# Patient Record
Sex: Female | Born: 1965
Health system: Southern US, Community
[De-identification: ages and names within clinical notes are randomized; demographics above are authoritative.]

## PROBLEM LIST (undated history)

## (undated) DIAGNOSIS — Z8673 Personal history of transient ischemic attack (TIA), and cerebral infarction without residual deficits: Secondary | ICD-10-CM

## (undated) DIAGNOSIS — K219 Gastro-esophageal reflux disease without esophagitis: Secondary | ICD-10-CM

## (undated) DIAGNOSIS — F449 Dissociative and conversion disorder, unspecified: Secondary | ICD-10-CM

## (undated) DIAGNOSIS — K589 Irritable bowel syndrome without diarrhea: Secondary | ICD-10-CM

## (undated) DIAGNOSIS — H9190 Unspecified hearing loss, unspecified ear: Secondary | ICD-10-CM

## (undated) DIAGNOSIS — E785 Hyperlipidemia, unspecified: Secondary | ICD-10-CM

## (undated) DIAGNOSIS — A048 Other specified bacterial intestinal infections: Secondary | ICD-10-CM

## (undated) DIAGNOSIS — E162 Hypoglycemia, unspecified: Secondary | ICD-10-CM

## (undated) DIAGNOSIS — G4733 Obstructive sleep apnea (adult) (pediatric): Secondary | ICD-10-CM

## (undated) DIAGNOSIS — F32A Depression, unspecified: Secondary | ICD-10-CM

## (undated) DIAGNOSIS — M50322 Other cervical disc degeneration at C5-C6 level: Secondary | ICD-10-CM

## (undated) DIAGNOSIS — R42 Dizziness and giddiness: Secondary | ICD-10-CM

## (undated) DIAGNOSIS — M797 Fibromyalgia: Secondary | ICD-10-CM

## (undated) DIAGNOSIS — G894 Chronic pain syndrome: Secondary | ICD-10-CM

## (undated) DIAGNOSIS — H6990 Unspecified Eustachian tube disorder, unspecified ear: Secondary | ICD-10-CM

## (undated) DIAGNOSIS — G43909 Migraine, unspecified, not intractable, without status migrainosus: Secondary | ICD-10-CM

## (undated) DIAGNOSIS — J449 Chronic obstructive pulmonary disease, unspecified: Secondary | ICD-10-CM

## (undated) DIAGNOSIS — L409 Psoriasis, unspecified: Secondary | ICD-10-CM

## (undated) DIAGNOSIS — K3184 Gastroparesis: Secondary | ICD-10-CM

## (undated) DIAGNOSIS — I63522 Cerebral infarction due to unspecified occlusion or stenosis of left anterior cerebral artery: Secondary | ICD-10-CM

## (undated) DIAGNOSIS — G939 Disorder of brain, unspecified: Secondary | ICD-10-CM

## (undated) DIAGNOSIS — R5382 Chronic fatigue, unspecified: Secondary | ICD-10-CM

## (undated) DIAGNOSIS — M159 Polyosteoarthritis, unspecified: Secondary | ICD-10-CM

## (undated) DIAGNOSIS — K5792 Diverticulitis of intestine, part unspecified, without perforation or abscess without bleeding: Secondary | ICD-10-CM

## (undated) DIAGNOSIS — F419 Anxiety disorder, unspecified: Secondary | ICD-10-CM

## (undated) DIAGNOSIS — K449 Diaphragmatic hernia without obstruction or gangrene: Secondary | ICD-10-CM

## (undated) DIAGNOSIS — I10 Essential (primary) hypertension: Secondary | ICD-10-CM

## (undated) DIAGNOSIS — F329 Major depressive disorder, single episode, unspecified: Secondary | ICD-10-CM

## (undated) DIAGNOSIS — I639 Cerebral infarction, unspecified: Secondary | ICD-10-CM

## (undated) DIAGNOSIS — K649 Unspecified hemorrhoids: Secondary | ICD-10-CM

## (undated) DIAGNOSIS — Z8679 Personal history of other diseases of the circulatory system: Secondary | ICD-10-CM

## (undated) DIAGNOSIS — H6980 Other specified disorders of Eustachian tube, unspecified ear: Secondary | ICD-10-CM

## (undated) DIAGNOSIS — E559 Vitamin D deficiency, unspecified: Secondary | ICD-10-CM

## (undated) HISTORY — PX: OTHER SURGICAL HISTORY: SHX169

## (undated) HISTORY — DX: Irritable bowel syndrome, unspecified: K58.9

## (undated) HISTORY — DX: Hyperlipidemia, unspecified: E78.5

## (undated) HISTORY — DX: Cerebral infarction due to unspecified occlusion or stenosis of left anterior cerebral artery: I63.522

## (undated) HISTORY — DX: Unspecified eustachian tube disorder, unspecified ear: H69.90

## (undated) HISTORY — DX: Obstructive sleep apnea (adult) (pediatric): G47.33

## (undated) HISTORY — DX: Psoriasis, unspecified: L40.9

## (undated) HISTORY — PX: CHOLECYSTECTOMY: SHX55

## (undated) HISTORY — DX: Unspecified hearing loss, unspecified ear: H91.90

## (undated) HISTORY — PX: ABDOMINAL HYSTERECTOMY: SHX81

## (undated) HISTORY — DX: Migraine, unspecified, not intractable, without status migrainosus: G43.909

## (undated) HISTORY — DX: Gastroparesis: K31.84

## (undated) HISTORY — DX: Polyosteoarthritis, unspecified: M15.9

## (undated) HISTORY — DX: Unspecified hemorrhoids: K64.9

## (undated) HISTORY — DX: Dizziness and giddiness: R42

## (undated) HISTORY — DX: Personal history of other diseases of the circulatory system: Z86.79

## (undated) HISTORY — DX: Chronic pain syndrome: G89.4

## (undated) HISTORY — PX: TUBAL LIGATION: SHX77

## (undated) HISTORY — DX: Diverticulitis of intestine, part unspecified, without perforation or abscess without bleeding: K57.92

## (undated) HISTORY — DX: Morbid (severe) obesity due to excess calories: E66.01

## (undated) HISTORY — DX: Diaphragmatic hernia without obstruction or gangrene: K44.9

## (undated) HISTORY — DX: Other specified disorders of Eustachian tube, unspecified ear: H69.80

## (undated) HISTORY — DX: Dissociative and conversion disorder, unspecified: F44.9

## (undated) HISTORY — DX: Other specified bacterial intestinal infections: A04.8

## (undated) HISTORY — DX: Disorder of brain, unspecified: G93.9

## (undated) HISTORY — DX: Vitamin D deficiency, unspecified: E55.9

## (undated) HISTORY — DX: Gastro-esophageal reflux disease without esophagitis: K21.9

## (undated) HISTORY — DX: Hypoglycemia, unspecified: E16.2

## (undated) HISTORY — DX: Personal history of transient ischemic attack (TIA), and cerebral infarction without residual deficits: Z86.73

## (undated) HISTORY — DX: Other cervical disc degeneration at C5-C6 level: M50.322

---

## 2011-08-22 ENCOUNTER — Encounter (HOSPITAL_COMMUNITY): Payer: Self-pay | Admitting: *Deleted

## 2011-08-22 ENCOUNTER — Emergency Department (HOSPITAL_COMMUNITY): Payer: Medicare Other

## 2011-08-22 ENCOUNTER — Emergency Department (HOSPITAL_COMMUNITY)
Admission: EM | Admit: 2011-08-22 | Discharge: 2011-08-22 | Disposition: A | Discharge status: Home / Self Care | Payer: Medicare Other | Attending: Emergency Medicine | Admitting: Emergency Medicine

## 2011-08-22 DIAGNOSIS — R079 Chest pain, unspecified: Secondary | ICD-10-CM | POA: Insufficient documentation

## 2011-08-22 DIAGNOSIS — I10 Essential (primary) hypertension: Secondary | ICD-10-CM | POA: Insufficient documentation

## 2011-08-22 DIAGNOSIS — R5383 Other fatigue: Secondary | ICD-10-CM | POA: Insufficient documentation

## 2011-08-22 DIAGNOSIS — F341 Dysthymic disorder: Secondary | ICD-10-CM | POA: Insufficient documentation

## 2011-08-22 DIAGNOSIS — Z79899 Other long term (current) drug therapy: Secondary | ICD-10-CM | POA: Insufficient documentation

## 2011-08-22 DIAGNOSIS — N390 Urinary tract infection, site not specified: Secondary | ICD-10-CM

## 2011-08-22 DIAGNOSIS — Z8673 Personal history of transient ischemic attack (TIA), and cerebral infarction without residual deficits: Secondary | ICD-10-CM | POA: Insufficient documentation

## 2011-08-22 DIAGNOSIS — R5381 Other malaise: Secondary | ICD-10-CM | POA: Insufficient documentation

## 2011-08-22 DIAGNOSIS — J449 Chronic obstructive pulmonary disease, unspecified: Secondary | ICD-10-CM | POA: Insufficient documentation

## 2011-08-22 DIAGNOSIS — J4489 Other specified chronic obstructive pulmonary disease: Secondary | ICD-10-CM | POA: Insufficient documentation

## 2011-08-22 HISTORY — DX: Essential (primary) hypertension: I10

## 2011-08-22 HISTORY — DX: Fibromyalgia: M79.7

## 2011-08-22 HISTORY — DX: Chronic obstructive pulmonary disease, unspecified: J44.9

## 2011-08-22 HISTORY — DX: Anxiety disorder, unspecified: F41.9

## 2011-08-22 HISTORY — DX: Cerebral infarction, unspecified: I63.9

## 2011-08-22 HISTORY — DX: Major depressive disorder, single episode, unspecified: F32.9

## 2011-08-22 HISTORY — DX: Depression, unspecified: F32.A

## 2011-08-22 HISTORY — DX: Chronic fatigue, unspecified: R53.82

## 2011-08-22 LAB — URINALYSIS, ROUTINE W REFLEX MICROSCOPIC
Glucose, UA: NEGATIVE mg/dL
Hgb urine dipstick: NEGATIVE
Protein, ur: NEGATIVE mg/dL
Specific Gravity, Urine: 1.011 (ref 1.005–1.030)
pH: 6.5 (ref 5.0–8.0)

## 2011-08-22 LAB — DIFFERENTIAL
Basophils Absolute: 0 10*3/uL (ref 0.0–0.1)
Basophils Relative: 0 % (ref 0–1)
Eosinophils Absolute: 0.1 10*3/uL (ref 0.0–0.7)
Eosinophils Relative: 1 % (ref 0–5)
Monocytes Absolute: 0.6 10*3/uL (ref 0.1–1.0)
Monocytes Relative: 7 % (ref 3–12)
Neutro Abs: 4.9 10*3/uL (ref 1.7–7.7)

## 2011-08-22 LAB — COMPREHENSIVE METABOLIC PANEL
AST: 29 U/L (ref 0–37)
Albumin: 3.7 g/dL (ref 3.5–5.2)
BUN: 9 mg/dL (ref 6–23)
Calcium: 9.8 mg/dL (ref 8.4–10.5)
Chloride: 101 mEq/L (ref 96–112)
Creatinine, Ser: 0.67 mg/dL (ref 0.50–1.10)
Total Bilirubin: 0.4 mg/dL (ref 0.3–1.2)
Total Protein: 7.1 g/dL (ref 6.0–8.3)

## 2011-08-22 LAB — CBC
HCT: 43.9 % (ref 36.0–46.0)
Hemoglobin: 15.4 g/dL — ABNORMAL HIGH (ref 12.0–15.0)
MCH: 30.9 pg (ref 26.0–34.0)
MCHC: 35.1 g/dL (ref 30.0–36.0)
RDW: 12.7 % (ref 11.5–15.5)

## 2011-08-22 LAB — POCT PREGNANCY, URINE: Preg Test, Ur: NEGATIVE

## 2011-08-22 LAB — URINE MICROSCOPIC-ADD ON

## 2011-08-22 MED ORDER — SULFAMETHOXAZOLE-TMP DS 800-160 MG PO TABS
1.0000 | ORAL_TABLET | Freq: Once | ORAL | Status: AC
  Administered 2011-08-22: 1 via ORAL
  Filled 2011-08-22: qty 1

## 2011-08-22 MED ORDER — SODIUM CHLORIDE 0.9 % IV BOLUS (SEPSIS)
1000.0000 mL | Freq: Once | INTRAVENOUS | Status: AC
Start: 2011-08-22 — End: 2011-08-22
  Administered 2011-08-22: 1000 mL via INTRAVENOUS

## 2011-08-22 MED ORDER — SULFAMETHOXAZOLE-TRIMETHOPRIM 800-160 MG PO TABS: 1.0000 | ORAL_TABLET | Freq: Two times a day (BID) | ORAL | Status: AC

## 2011-08-22 NOTE — ED Notes (Addendum)
Report given and care transferred to Lyn, RN. 

## 2011-08-22 NOTE — ED Notes (Signed)
Pt ambulated to RR for ccus.

## 2011-08-22 NOTE — Discharge Instructions (Signed)

## 2011-08-22 NOTE — ED Notes (Signed)
Pt resting in stretcher in NAD, respirations even and unlabored. 

## 2011-08-22 NOTE — ED Notes (Signed)
MD at bedside. 

## 2011-08-22 NOTE — ED Notes (Signed)
Patient reports her arms and feeling numb,  It started on the right and now moved to the left.  She is having intermittent chest pain as well.  She states her mouth feels dry as well and she feels confused.  Patient states she started to feel bad on Monday night.

## 2011-08-22 NOTE — ED Notes (Signed)
Pt getting undressed and into a gown; pt placed on monitor, continuous pulse oximetry and blood pressure cuff; warm blankets given

## 2011-08-22 NOTE — ED Notes (Signed)
Patient transported to X-ray 

## 2011-08-22 NOTE — ED Provider Notes (Signed)
History     CSN: [id number]  Arrival date & time 08/22/11  1323   First MD Initiated Contact with Patient 08/22/11 1502      Chief Complaint  Patient presents with  . Weakness  . Chest Pain     HPI The patient presents with concerns of fatigue and right arm dysesthesia.  She notes that she has a history both of her strokes and of fibromyalgia.  Over the past 2 days she has gradually developed more present and uncomfortable diffuse dysesthesia throughout her right arm.  This is worse with motion.  She notes concurrent fatigue and generalized sense of discomfort.  She denies any fevers, chills, nausea, vomiting.  She has been taking her medications appropriately, and there have been no recent changes.  She notes that this episode is similar to multiple prior events.  Past Medical History  Diagnosis Date  . Stroke   . Fibromyalgia   . Hypertension   . Asthma   . COPD (chronic obstructive pulmonary disease)   . Chronic fatigue   . Anxiety   . Depression     Past Surgical History  Procedure Date  . Cholecystectomy   . Tubal ligation   . Abdominal hysterectomy     No family history on file.  History  Substance Use Topics  . Smoking status: Never Smoker   . Smokeless tobacco: Not on file  . Alcohol Use: No    OB History    Grav Para Term Preterm Abortions TAB SAB Ect Mult Living                  Review of Systems  Constitutional:       HPI  HENT:       HPI otherwise negative  Eyes: Negative.   Respiratory:       HPI, otherwise negative  Cardiovascular:       HPI, otherwise nmegative  Gastrointestinal: Negative for vomiting.  Genitourinary:       HPI, otherwise negative  Musculoskeletal:       HPI, otherwise negative  Skin: Negative.   Neurological: Negative for syncope.    Allergies  Review of patient's allergies indicates no known allergies.  Home Medications   Current Outpatient Rx  Name Route Sig Dispense Refill  . ALBUTEROL SULFATE HFA 108  (90 BASE) MCG/ACT IN AERS Inhalation Inhale 2 puffs into the lungs every 4 (four) hours as needed. For wheezing    . ALPRAZOLAM 0.25 MG PO TABS Oral Take 0.25 mg by mouth 3 (three) times daily as needed. For anxiety    . CITALOPRAM HYDROBROMIDE 20 MG PO TABS Oral Take 20 mg by mouth daily.    Marland Kitchen CLOPIDOGREL BISULFATE 75 MG PO TABS Oral Take 75 mg by mouth daily.    . CYCLOBENZAPRINE HCL 10 MG PO TABS Oral Take 10 mg by mouth 3 (three) times daily as needed. For spasm    . DICYCLOMINE HCL 20 MG PO TABS Oral Take 20 mg by mouth every 6 (six) hours.    Marland Kitchen HYDROCODONE-ACETAMINOPHEN 10-500 MG PO TABS Oral Take 1 tablet by mouth 4 (four) times daily.    Marland Kitchen LISINOPRIL 40 MG PO TABS Oral Take 40 mg by mouth daily.    Marland Kitchen MUPIROCIN 2 % EX OINT Topical Apply 1 application topically 3 (three) times daily.    Marland Kitchen OMEPRAZOLE 20 MG PO CPDR Oral Take 20 mg by mouth 2 (two) times daily.    . ORPHENADRINE CITRATE ER 100  MG PO TB12 Oral Take 100 mg by mouth 2 (two) times daily as needed. For spasm    . PROMETHAZINE HCL 25 MG PO TABS Oral Take 25 mg by mouth every 6 (six) hours as needed. For nausea    . RANITIDINE HCL 150 MG PO TABS Oral Take 300 mg by mouth daily.    . TOPIRAMATE 50 MG PO TABS Oral Take 50 mg by mouth 2 (two) times daily.      BP 153/99  Pulse 76  Temp(Src) 98.4 F (36.9 C) (Oral)  Resp 16  Ht 4\' 10"  (1.473 m)  Wt 171 lb (77.565 kg)  BMI 35.74 kg/m2  SpO2 98%  Physical Exam  Nursing note and vitals reviewed. Constitutional: She is oriented to person, place, and time. She appears well-developed and well-nourished. No distress.  HENT:  Head: Normocephalic and atraumatic.  Mouth/Throat: Mucous membranes are dry and not cyanotic.  Eyes: Conjunctivae and EOM are normal.  Cardiovascular: Normal rate and regular rhythm.   Pulmonary/Chest: Effort normal and breath sounds normal. No stridor. No respiratory distress.  Abdominal: She exhibits no distension.  Musculoskeletal: She exhibits no edema.         5/5 strength in bil ue/le in shoulders, wrist, ankles, hips.  Neurological: She is alert and oriented to person, place, and time. She has normal strength. She displays no atrophy and no tremor. No cranial nerve deficit or sensory deficit. She displays no seizure activity. Coordination normal.       Patient describes R arm "different" sensation, but affirms sensing light touch on my exam.  Skin: Skin is warm and dry. She is not diaphoretic.  Psychiatric: She has a normal mood and affect.    ED Course  Procedures (including critical care time)   Labs Reviewed  CBC  DIFFERENTIAL  COMPREHENSIVE METABOLIC PANEL  URINALYSIS, ROUTINE W REFLEX MICROSCOPIC   No results found.   No diagnosis found.  Cardiac: 77 sr, normal  Pulse Ox 96% RA - normal   Date: 08/22/2011  Rate: 82  Rhythm: normal sinus rhythm  QRS Axis: normal  Intervals: normal  ST/T Wave abnormalities: normal  Conduction Disutrbances: none  Narrative Interpretation: unremarkable       MDM  This 46 year old female presents with several days of generalized complaints, extremity dysesthesia.  On exam the patient is in no distress with unremarkable vital signs.  Patient has no focal neurologic deficits.  She does appear dehydrated, with a dry mouth.  Following rehydration with IV fluids, the patient noted an improvement in her condition.  The patient's labs and was notable for a urinary tract infection.  The patient was discharged in stable condition to follow up with her primary care physician.  Given the absence of focal neurologic findings, radiographic imaging was not indicated.        Gerhard Munch, MD 08/23/11 [phone]

## 2013-09-11 HISTORY — PX: ESOPHAGOGASTRODUODENOSCOPY: SHX1529

## 2014-05-17 DIAGNOSIS — I10 Essential (primary) hypertension: Secondary | ICD-10-CM | POA: Diagnosis not present

## 2014-05-17 DIAGNOSIS — R0789 Other chest pain: Secondary | ICD-10-CM | POA: Diagnosis not present

## 2014-05-20 DIAGNOSIS — R079 Chest pain, unspecified: Secondary | ICD-10-CM | POA: Diagnosis not present

## 2014-05-20 DIAGNOSIS — R0602 Shortness of breath: Secondary | ICD-10-CM | POA: Diagnosis not present

## 2014-05-20 DIAGNOSIS — I10 Essential (primary) hypertension: Secondary | ICD-10-CM | POA: Diagnosis not present

## 2014-05-20 DIAGNOSIS — R0789 Other chest pain: Secondary | ICD-10-CM | POA: Diagnosis not present

## 2014-05-24 DIAGNOSIS — E785 Hyperlipidemia, unspecified: Secondary | ICD-10-CM | POA: Diagnosis not present

## 2014-05-24 DIAGNOSIS — R0789 Other chest pain: Secondary | ICD-10-CM | POA: Diagnosis not present

## 2014-05-24 DIAGNOSIS — M797 Fibromyalgia: Secondary | ICD-10-CM | POA: Diagnosis not present

## 2014-05-24 DIAGNOSIS — R0602 Shortness of breath: Secondary | ICD-10-CM | POA: Diagnosis not present

## 2014-05-24 DIAGNOSIS — I10 Essential (primary) hypertension: Secondary | ICD-10-CM | POA: Diagnosis not present

## 2014-05-31 DIAGNOSIS — G4733 Obstructive sleep apnea (adult) (pediatric): Secondary | ICD-10-CM | POA: Diagnosis not present

## 2014-05-31 DIAGNOSIS — J4520 Mild intermittent asthma, uncomplicated: Secondary | ICD-10-CM | POA: Diagnosis not present

## 2014-05-31 DIAGNOSIS — R5383 Other fatigue: Secondary | ICD-10-CM | POA: Diagnosis not present

## 2014-06-28 DIAGNOSIS — R5383 Other fatigue: Secondary | ICD-10-CM | POA: Diagnosis not present

## 2014-06-28 DIAGNOSIS — J4530 Mild persistent asthma, uncomplicated: Secondary | ICD-10-CM | POA: Diagnosis not present

## 2014-06-28 DIAGNOSIS — G4733 Obstructive sleep apnea (adult) (pediatric): Secondary | ICD-10-CM | POA: Diagnosis not present

## 2014-06-30 DIAGNOSIS — M5412 Radiculopathy, cervical region: Secondary | ICD-10-CM | POA: Diagnosis not present

## 2014-07-01 DIAGNOSIS — M9971 Connective tissue and disc stenosis of intervertebral foramina of cervical region: Secondary | ICD-10-CM | POA: Diagnosis not present

## 2014-07-01 DIAGNOSIS — M5412 Radiculopathy, cervical region: Secondary | ICD-10-CM | POA: Diagnosis not present

## 2014-07-01 DIAGNOSIS — M5082 Other cervical disc disorders, mid-cervical region: Secondary | ICD-10-CM | POA: Diagnosis not present

## 2014-07-02 DIAGNOSIS — M5412 Radiculopathy, cervical region: Secondary | ICD-10-CM | POA: Diagnosis not present

## 2014-07-22 DIAGNOSIS — I10 Essential (primary) hypertension: Secondary | ICD-10-CM | POA: Diagnosis not present

## 2014-07-22 DIAGNOSIS — Z79899 Other long term (current) drug therapy: Secondary | ICD-10-CM | POA: Diagnosis not present

## 2014-07-22 DIAGNOSIS — I6789 Other cerebrovascular disease: Secondary | ICD-10-CM | POA: Diagnosis not present

## 2014-07-22 DIAGNOSIS — E785 Hyperlipidemia, unspecified: Secondary | ICD-10-CM | POA: Diagnosis not present

## 2014-07-22 DIAGNOSIS — E559 Vitamin D deficiency, unspecified: Secondary | ICD-10-CM | POA: Diagnosis not present

## 2014-07-26 DIAGNOSIS — R5383 Other fatigue: Secondary | ICD-10-CM | POA: Diagnosis not present

## 2014-07-26 DIAGNOSIS — G4733 Obstructive sleep apnea (adult) (pediatric): Secondary | ICD-10-CM | POA: Diagnosis not present

## 2014-07-26 DIAGNOSIS — J4550 Severe persistent asthma, uncomplicated: Secondary | ICD-10-CM | POA: Diagnosis not present

## 2014-08-04 DIAGNOSIS — Z6841 Body Mass Index (BMI) 40.0 and over, adult: Secondary | ICD-10-CM | POA: Diagnosis not present

## 2014-08-04 DIAGNOSIS — J208 Acute bronchitis due to other specified organisms: Secondary | ICD-10-CM | POA: Diagnosis not present

## 2014-12-01 DIAGNOSIS — G4733 Obstructive sleep apnea (adult) (pediatric): Secondary | ICD-10-CM | POA: Diagnosis not present

## 2014-12-31 DIAGNOSIS — Z6841 Body Mass Index (BMI) 40.0 and over, adult: Secondary | ICD-10-CM | POA: Diagnosis not present

## 2014-12-31 DIAGNOSIS — K589 Irritable bowel syndrome without diarrhea: Secondary | ICD-10-CM | POA: Diagnosis not present

## 2014-12-31 DIAGNOSIS — R21 Rash and other nonspecific skin eruption: Secondary | ICD-10-CM | POA: Diagnosis not present

## 2014-12-31 DIAGNOSIS — F5104 Psychophysiologic insomnia: Secondary | ICD-10-CM | POA: Diagnosis not present

## 2015-01-25 DIAGNOSIS — M797 Fibromyalgia: Secondary | ICD-10-CM | POA: Diagnosis not present

## 2015-01-25 DIAGNOSIS — I6789 Other cerebrovascular disease: Secondary | ICD-10-CM | POA: Diagnosis not present

## 2015-01-25 DIAGNOSIS — E785 Hyperlipidemia, unspecified: Secondary | ICD-10-CM | POA: Diagnosis not present

## 2015-01-25 DIAGNOSIS — E559 Vitamin D deficiency, unspecified: Secondary | ICD-10-CM | POA: Diagnosis not present

## 2015-01-25 DIAGNOSIS — I10 Essential (primary) hypertension: Secondary | ICD-10-CM | POA: Diagnosis not present

## 2015-01-25 DIAGNOSIS — Z79899 Other long term (current) drug therapy: Secondary | ICD-10-CM | POA: Diagnosis not present

## 2015-02-07 DIAGNOSIS — R5383 Other fatigue: Secondary | ICD-10-CM | POA: Diagnosis not present

## 2015-02-07 DIAGNOSIS — G4733 Obstructive sleep apnea (adult) (pediatric): Secondary | ICD-10-CM | POA: Diagnosis not present

## 2015-02-07 DIAGNOSIS — J4520 Mild intermittent asthma, uncomplicated: Secondary | ICD-10-CM | POA: Diagnosis not present

## 2015-03-02 DIAGNOSIS — Z6841 Body Mass Index (BMI) 40.0 and over, adult: Secondary | ICD-10-CM | POA: Diagnosis not present

## 2015-03-10 DIAGNOSIS — Z86010 Personal history of colonic polyps: Secondary | ICD-10-CM | POA: Diagnosis not present

## 2015-03-10 DIAGNOSIS — R1013 Epigastric pain: Secondary | ICD-10-CM | POA: Diagnosis not present

## 2015-03-10 DIAGNOSIS — Z1211 Encounter for screening for malignant neoplasm of colon: Secondary | ICD-10-CM | POA: Diagnosis not present

## 2015-03-11 DIAGNOSIS — A09 Infectious gastroenteritis and colitis, unspecified: Secondary | ICD-10-CM | POA: Diagnosis not present

## 2015-03-21 DIAGNOSIS — K589 Irritable bowel syndrome without diarrhea: Secondary | ICD-10-CM | POA: Diagnosis not present

## 2015-03-21 DIAGNOSIS — R1013 Epigastric pain: Secondary | ICD-10-CM | POA: Diagnosis not present

## 2015-04-28 DIAGNOSIS — Z09 Encounter for follow-up examination after completed treatment for conditions other than malignant neoplasm: Secondary | ICD-10-CM | POA: Diagnosis not present

## 2015-04-28 DIAGNOSIS — Z1211 Encounter for screening for malignant neoplasm of colon: Secondary | ICD-10-CM | POA: Diagnosis not present

## 2015-04-28 DIAGNOSIS — I10 Essential (primary) hypertension: Secondary | ICD-10-CM | POA: Diagnosis not present

## 2015-04-28 DIAGNOSIS — Z9049 Acquired absence of other specified parts of digestive tract: Secondary | ICD-10-CM | POA: Diagnosis not present

## 2015-04-28 DIAGNOSIS — K5730 Diverticulosis of large intestine without perforation or abscess without bleeding: Secondary | ICD-10-CM | POA: Diagnosis not present

## 2015-04-28 DIAGNOSIS — K219 Gastro-esophageal reflux disease without esophagitis: Secondary | ICD-10-CM | POA: Diagnosis not present

## 2015-04-28 DIAGNOSIS — R197 Diarrhea, unspecified: Secondary | ICD-10-CM | POA: Diagnosis not present

## 2015-04-28 DIAGNOSIS — F419 Anxiety disorder, unspecified: Secondary | ICD-10-CM | POA: Diagnosis not present

## 2015-04-28 DIAGNOSIS — K648 Other hemorrhoids: Secondary | ICD-10-CM | POA: Diagnosis not present

## 2015-04-28 DIAGNOSIS — K589 Irritable bowel syndrome without diarrhea: Secondary | ICD-10-CM | POA: Diagnosis not present

## 2015-04-28 DIAGNOSIS — Z86010 Personal history of colonic polyps: Secondary | ICD-10-CM | POA: Diagnosis not present

## 2015-04-28 DIAGNOSIS — J45909 Unspecified asthma, uncomplicated: Secondary | ICD-10-CM | POA: Diagnosis not present

## 2015-04-28 HISTORY — PX: COLONOSCOPY: SHX5424

## 2015-06-22 DIAGNOSIS — Z6841 Body Mass Index (BMI) 40.0 and over, adult: Secondary | ICD-10-CM | POA: Diagnosis not present

## 2015-06-22 DIAGNOSIS — J4521 Mild intermittent asthma with (acute) exacerbation: Secondary | ICD-10-CM | POA: Diagnosis not present

## 2015-06-22 DIAGNOSIS — R5383 Other fatigue: Secondary | ICD-10-CM | POA: Diagnosis not present

## 2015-06-22 DIAGNOSIS — G4733 Obstructive sleep apnea (adult) (pediatric): Secondary | ICD-10-CM | POA: Diagnosis not present

## 2015-06-22 DIAGNOSIS — R21 Rash and other nonspecific skin eruption: Secondary | ICD-10-CM | POA: Diagnosis not present

## 2015-07-08 DIAGNOSIS — Z0100 Encounter for examination of eyes and vision without abnormal findings: Secondary | ICD-10-CM | POA: Diagnosis not present

## 2015-07-08 DIAGNOSIS — H5213 Myopia, bilateral: Secondary | ICD-10-CM | POA: Diagnosis not present

## 2015-07-28 DIAGNOSIS — E559 Vitamin D deficiency, unspecified: Secondary | ICD-10-CM | POA: Diagnosis not present

## 2015-07-28 DIAGNOSIS — E785 Hyperlipidemia, unspecified: Secondary | ICD-10-CM | POA: Diagnosis not present

## 2015-07-28 DIAGNOSIS — Z79899 Other long term (current) drug therapy: Secondary | ICD-10-CM | POA: Diagnosis not present

## 2015-07-28 DIAGNOSIS — I6789 Other cerebrovascular disease: Secondary | ICD-10-CM | POA: Diagnosis not present

## 2015-07-28 DIAGNOSIS — I10 Essential (primary) hypertension: Secondary | ICD-10-CM | POA: Diagnosis not present

## 2015-07-28 DIAGNOSIS — Z1389 Encounter for screening for other disorder: Secondary | ICD-10-CM | POA: Diagnosis not present

## 2015-07-28 DIAGNOSIS — Z1231 Encounter for screening mammogram for malignant neoplasm of breast: Secondary | ICD-10-CM | POA: Diagnosis not present

## 2015-07-28 DIAGNOSIS — M797 Fibromyalgia: Secondary | ICD-10-CM | POA: Diagnosis not present

## 2015-10-24 DIAGNOSIS — H5213 Myopia, bilateral: Secondary | ICD-10-CM | POA: Diagnosis not present

## 2015-10-24 DIAGNOSIS — H5210 Myopia, unspecified eye: Secondary | ICD-10-CM | POA: Diagnosis not present

## 2015-11-11 DIAGNOSIS — E785 Hyperlipidemia, unspecified: Secondary | ICD-10-CM | POA: Diagnosis not present

## 2015-11-11 DIAGNOSIS — I6789 Other cerebrovascular disease: Secondary | ICD-10-CM | POA: Diagnosis not present

## 2015-11-11 DIAGNOSIS — J45909 Unspecified asthma, uncomplicated: Secondary | ICD-10-CM | POA: Diagnosis not present

## 2015-11-11 DIAGNOSIS — I10 Essential (primary) hypertension: Secondary | ICD-10-CM | POA: Diagnosis not present

## 2015-11-11 DIAGNOSIS — M797 Fibromyalgia: Secondary | ICD-10-CM | POA: Diagnosis not present

## 2015-11-11 DIAGNOSIS — F419 Anxiety disorder, unspecified: Secondary | ICD-10-CM | POA: Diagnosis not present

## 2015-11-11 DIAGNOSIS — Z79899 Other long term (current) drug therapy: Secondary | ICD-10-CM | POA: Diagnosis not present

## 2015-11-11 DIAGNOSIS — E559 Vitamin D deficiency, unspecified: Secondary | ICD-10-CM | POA: Diagnosis not present

## 2015-11-11 DIAGNOSIS — G894 Chronic pain syndrome: Secondary | ICD-10-CM | POA: Diagnosis not present

## 2015-12-08 DIAGNOSIS — Z6839 Body mass index (BMI) 39.0-39.9, adult: Secondary | ICD-10-CM | POA: Diagnosis not present

## 2015-12-08 DIAGNOSIS — I10 Essential (primary) hypertension: Secondary | ICD-10-CM | POA: Diagnosis not present

## 2016-01-05 DIAGNOSIS — Z6838 Body mass index (BMI) 38.0-38.9, adult: Secondary | ICD-10-CM | POA: Diagnosis not present

## 2016-01-05 DIAGNOSIS — I10 Essential (primary) hypertension: Secondary | ICD-10-CM | POA: Diagnosis not present

## 2016-02-02 DIAGNOSIS — I10 Essential (primary) hypertension: Secondary | ICD-10-CM | POA: Diagnosis not present

## 2016-02-02 DIAGNOSIS — Z6838 Body mass index (BMI) 38.0-38.9, adult: Secondary | ICD-10-CM | POA: Diagnosis not present

## 2016-03-05 DIAGNOSIS — G894 Chronic pain syndrome: Secondary | ICD-10-CM | POA: Diagnosis not present

## 2016-03-05 DIAGNOSIS — R635 Abnormal weight gain: Secondary | ICD-10-CM | POA: Diagnosis not present

## 2016-03-05 DIAGNOSIS — L219 Seborrheic dermatitis, unspecified: Secondary | ICD-10-CM | POA: Diagnosis not present

## 2016-03-05 DIAGNOSIS — Z1231 Encounter for screening mammogram for malignant neoplasm of breast: Secondary | ICD-10-CM | POA: Diagnosis not present

## 2016-03-05 DIAGNOSIS — Z6838 Body mass index (BMI) 38.0-38.9, adult: Secondary | ICD-10-CM | POA: Diagnosis not present

## 2016-03-05 DIAGNOSIS — I10 Essential (primary) hypertension: Secondary | ICD-10-CM | POA: Diagnosis not present

## 2016-03-05 DIAGNOSIS — M50322 Other cervical disc degeneration at C5-C6 level: Secondary | ICD-10-CM | POA: Diagnosis not present

## 2016-04-02 DIAGNOSIS — I10 Essential (primary) hypertension: Secondary | ICD-10-CM | POA: Diagnosis not present

## 2016-04-02 DIAGNOSIS — Z6839 Body mass index (BMI) 39.0-39.9, adult: Secondary | ICD-10-CM | POA: Diagnosis not present

## 2016-04-02 DIAGNOSIS — G894 Chronic pain syndrome: Secondary | ICD-10-CM | POA: Diagnosis not present

## 2016-04-02 DIAGNOSIS — M50322 Other cervical disc degeneration at C5-C6 level: Secondary | ICD-10-CM | POA: Diagnosis not present

## 2016-04-02 DIAGNOSIS — R635 Abnormal weight gain: Secondary | ICD-10-CM | POA: Diagnosis not present

## 2016-04-03 DIAGNOSIS — Z1231 Encounter for screening mammogram for malignant neoplasm of breast: Secondary | ICD-10-CM | POA: Diagnosis not present

## 2016-05-01 DIAGNOSIS — G894 Chronic pain syndrome: Secondary | ICD-10-CM | POA: Diagnosis not present

## 2016-05-01 DIAGNOSIS — Z6838 Body mass index (BMI) 38.0-38.9, adult: Secondary | ICD-10-CM | POA: Diagnosis not present

## 2016-05-01 DIAGNOSIS — R635 Abnormal weight gain: Secondary | ICD-10-CM | POA: Diagnosis not present

## 2016-05-01 DIAGNOSIS — E669 Obesity, unspecified: Secondary | ICD-10-CM | POA: Diagnosis not present

## 2016-05-01 DIAGNOSIS — I10 Essential (primary) hypertension: Secondary | ICD-10-CM | POA: Diagnosis not present

## 2016-05-04 DIAGNOSIS — Z0100 Encounter for examination of eyes and vision without abnormal findings: Secondary | ICD-10-CM | POA: Diagnosis not present

## 2016-05-30 DIAGNOSIS — R635 Abnormal weight gain: Secondary | ICD-10-CM | POA: Diagnosis not present

## 2016-05-30 DIAGNOSIS — Z6838 Body mass index (BMI) 38.0-38.9, adult: Secondary | ICD-10-CM | POA: Diagnosis not present

## 2016-05-30 DIAGNOSIS — I10 Essential (primary) hypertension: Secondary | ICD-10-CM | POA: Diagnosis not present

## 2016-05-30 DIAGNOSIS — E669 Obesity, unspecified: Secondary | ICD-10-CM | POA: Diagnosis not present

## 2016-05-30 DIAGNOSIS — G894 Chronic pain syndrome: Secondary | ICD-10-CM | POA: Diagnosis not present

## 2016-06-22 DIAGNOSIS — Z6838 Body mass index (BMI) 38.0-38.9, adult: Secondary | ICD-10-CM | POA: Diagnosis not present

## 2016-06-22 DIAGNOSIS — J208 Acute bronchitis due to other specified organisms: Secondary | ICD-10-CM | POA: Diagnosis not present

## 2016-06-29 DIAGNOSIS — Z79899 Other long term (current) drug therapy: Secondary | ICD-10-CM | POA: Diagnosis not present

## 2016-06-29 DIAGNOSIS — M4726 Other spondylosis with radiculopathy, lumbar region: Secondary | ICD-10-CM | POA: Diagnosis not present

## 2016-06-29 DIAGNOSIS — M797 Fibromyalgia: Secondary | ICD-10-CM | POA: Diagnosis not present

## 2016-06-29 DIAGNOSIS — F419 Anxiety disorder, unspecified: Secondary | ICD-10-CM | POA: Diagnosis not present

## 2016-06-29 DIAGNOSIS — R635 Abnormal weight gain: Secondary | ICD-10-CM | POA: Diagnosis not present

## 2016-06-29 DIAGNOSIS — E785 Hyperlipidemia, unspecified: Secondary | ICD-10-CM | POA: Diagnosis not present

## 2016-06-29 DIAGNOSIS — I10 Essential (primary) hypertension: Secondary | ICD-10-CM | POA: Diagnosis not present

## 2016-06-29 DIAGNOSIS — M159 Polyosteoarthritis, unspecified: Secondary | ICD-10-CM | POA: Diagnosis not present

## 2016-06-29 DIAGNOSIS — M50322 Other cervical disc degeneration at C5-C6 level: Secondary | ICD-10-CM | POA: Diagnosis not present

## 2016-06-29 DIAGNOSIS — E559 Vitamin D deficiency, unspecified: Secondary | ICD-10-CM | POA: Diagnosis not present

## 2016-06-29 DIAGNOSIS — R7301 Impaired fasting glucose: Secondary | ICD-10-CM | POA: Diagnosis not present

## 2016-06-29 DIAGNOSIS — G894 Chronic pain syndrome: Secondary | ICD-10-CM | POA: Diagnosis not present

## 2016-07-10 DIAGNOSIS — J4530 Mild persistent asthma, uncomplicated: Secondary | ICD-10-CM | POA: Diagnosis not present

## 2016-07-10 DIAGNOSIS — G4733 Obstructive sleep apnea (adult) (pediatric): Secondary | ICD-10-CM | POA: Diagnosis not present

## 2016-07-10 DIAGNOSIS — R5383 Other fatigue: Secondary | ICD-10-CM | POA: Diagnosis not present

## 2016-07-25 DIAGNOSIS — M4726 Other spondylosis with radiculopathy, lumbar region: Secondary | ICD-10-CM | POA: Diagnosis not present

## 2016-07-25 DIAGNOSIS — M47812 Spondylosis without myelopathy or radiculopathy, cervical region: Secondary | ICD-10-CM | POA: Diagnosis not present

## 2016-07-25 DIAGNOSIS — M50322 Other cervical disc degeneration at C5-C6 level: Secondary | ICD-10-CM | POA: Diagnosis not present

## 2016-07-25 DIAGNOSIS — M545 Low back pain: Secondary | ICD-10-CM | POA: Diagnosis not present

## 2016-07-30 DIAGNOSIS — Z1389 Encounter for screening for other disorder: Secondary | ICD-10-CM | POA: Diagnosis not present

## 2016-07-30 DIAGNOSIS — I10 Essential (primary) hypertension: Secondary | ICD-10-CM | POA: Diagnosis not present

## 2016-07-30 DIAGNOSIS — Z6839 Body mass index (BMI) 39.0-39.9, adult: Secondary | ICD-10-CM | POA: Diagnosis not present

## 2016-07-30 DIAGNOSIS — G894 Chronic pain syndrome: Secondary | ICD-10-CM | POA: Diagnosis not present

## 2016-07-30 DIAGNOSIS — R635 Abnormal weight gain: Secondary | ICD-10-CM | POA: Diagnosis not present

## 2016-07-30 DIAGNOSIS — M797 Fibromyalgia: Secondary | ICD-10-CM | POA: Diagnosis not present

## 2016-08-08 DIAGNOSIS — G4733 Obstructive sleep apnea (adult) (pediatric): Secondary | ICD-10-CM | POA: Diagnosis not present

## 2016-08-15 DIAGNOSIS — J4520 Mild intermittent asthma, uncomplicated: Secondary | ICD-10-CM | POA: Diagnosis not present

## 2016-08-15 DIAGNOSIS — G4733 Obstructive sleep apnea (adult) (pediatric): Secondary | ICD-10-CM | POA: Diagnosis not present

## 2016-08-15 DIAGNOSIS — R5383 Other fatigue: Secondary | ICD-10-CM | POA: Diagnosis not present

## 2016-08-21 DIAGNOSIS — R269 Unspecified abnormalities of gait and mobility: Secondary | ICD-10-CM | POA: Diagnosis not present

## 2016-08-21 DIAGNOSIS — G4733 Obstructive sleep apnea (adult) (pediatric): Secondary | ICD-10-CM | POA: Diagnosis not present

## 2016-08-24 DIAGNOSIS — M5030 Other cervical disc degeneration, unspecified cervical region: Secondary | ICD-10-CM | POA: Diagnosis not present

## 2016-08-29 DIAGNOSIS — R635 Abnormal weight gain: Secondary | ICD-10-CM | POA: Diagnosis not present

## 2016-08-29 DIAGNOSIS — G894 Chronic pain syndrome: Secondary | ICD-10-CM | POA: Diagnosis not present

## 2016-08-29 DIAGNOSIS — Z6838 Body mass index (BMI) 38.0-38.9, adult: Secondary | ICD-10-CM | POA: Diagnosis not present

## 2016-08-29 DIAGNOSIS — M797 Fibromyalgia: Secondary | ICD-10-CM | POA: Diagnosis not present

## 2016-08-29 DIAGNOSIS — I10 Essential (primary) hypertension: Secondary | ICD-10-CM | POA: Diagnosis not present

## 2016-08-29 DIAGNOSIS — M50322 Other cervical disc degeneration at C5-C6 level: Secondary | ICD-10-CM | POA: Diagnosis not present

## 2016-08-29 DIAGNOSIS — M47817 Spondylosis without myelopathy or radiculopathy, lumbosacral region: Secondary | ICD-10-CM | POA: Diagnosis not present

## 2016-09-11 DIAGNOSIS — M542 Cervicalgia: Secondary | ICD-10-CM | POA: Diagnosis not present

## 2016-09-25 DIAGNOSIS — R5383 Other fatigue: Secondary | ICD-10-CM | POA: Diagnosis not present

## 2016-09-25 DIAGNOSIS — J4521 Mild intermittent asthma with (acute) exacerbation: Secondary | ICD-10-CM | POA: Diagnosis not present

## 2016-09-25 DIAGNOSIS — G4733 Obstructive sleep apnea (adult) (pediatric): Secondary | ICD-10-CM | POA: Diagnosis not present

## 2016-09-26 DIAGNOSIS — Z6838 Body mass index (BMI) 38.0-38.9, adult: Secondary | ICD-10-CM | POA: Diagnosis not present

## 2016-09-26 DIAGNOSIS — G894 Chronic pain syndrome: Secondary | ICD-10-CM | POA: Diagnosis not present

## 2016-09-26 DIAGNOSIS — I10 Essential (primary) hypertension: Secondary | ICD-10-CM | POA: Diagnosis not present

## 2016-09-27 ENCOUNTER — Encounter (HOSPITAL_COMMUNITY): Payer: Self-pay | Admitting: Emergency Medicine

## 2016-09-27 ENCOUNTER — Emergency Department (HOSPITAL_COMMUNITY): Payer: Medicare HMO

## 2016-09-27 ENCOUNTER — Inpatient Hospital Stay (HOSPITAL_COMMUNITY): Payer: Medicare HMO

## 2016-09-27 ENCOUNTER — Inpatient Hospital Stay (HOSPITAL_COMMUNITY)
Admission: EM | Admit: 2016-09-27 | Discharge: 2016-10-02 | DRG: 041 | Disposition: A | Discharge status: Home Health | Payer: Medicare HMO | Attending: Internal Medicine | Admitting: Internal Medicine

## 2016-09-27 DIAGNOSIS — J209 Acute bronchitis, unspecified: Secondary | ICD-10-CM | POA: Diagnosis not present

## 2016-09-27 DIAGNOSIS — I638 Other cerebral infarction: Secondary | ICD-10-CM | POA: Diagnosis not present

## 2016-09-27 DIAGNOSIS — R51 Headache: Secondary | ICD-10-CM | POA: Diagnosis not present

## 2016-09-27 DIAGNOSIS — G8191 Hemiplegia, unspecified affecting right dominant side: Secondary | ICD-10-CM | POA: Diagnosis present

## 2016-09-27 DIAGNOSIS — Z6837 Body mass index (BMI) 37.0-37.9, adult: Secondary | ICD-10-CM | POA: Diagnosis not present

## 2016-09-27 DIAGNOSIS — I10 Essential (primary) hypertension: Secondary | ICD-10-CM | POA: Diagnosis not present

## 2016-09-27 DIAGNOSIS — R269 Unspecified abnormalities of gait and mobility: Secondary | ICD-10-CM | POA: Diagnosis not present

## 2016-09-27 DIAGNOSIS — I63522 Cerebral infarction due to unspecified occlusion or stenosis of left anterior cerebral artery: Secondary | ICD-10-CM | POA: Diagnosis not present

## 2016-09-27 DIAGNOSIS — G4733 Obstructive sleep apnea (adult) (pediatric): Secondary | ICD-10-CM | POA: Diagnosis not present

## 2016-09-27 DIAGNOSIS — R030 Elevated blood-pressure reading, without diagnosis of hypertension: Secondary | ICD-10-CM | POA: Diagnosis not present

## 2016-09-27 DIAGNOSIS — F419 Anxiety disorder, unspecified: Secondary | ICD-10-CM | POA: Diagnosis present

## 2016-09-27 DIAGNOSIS — I63322 Cerebral infarction due to thrombosis of left anterior cerebral artery: Secondary | ICD-10-CM | POA: Diagnosis not present

## 2016-09-27 DIAGNOSIS — E876 Hypokalemia: Secondary | ICD-10-CM | POA: Diagnosis not present

## 2016-09-27 DIAGNOSIS — E669 Obesity, unspecified: Secondary | ICD-10-CM | POA: Diagnosis present

## 2016-09-27 DIAGNOSIS — I63422 Cerebral infarction due to embolism of left anterior cerebral artery: Secondary | ICD-10-CM | POA: Diagnosis not present

## 2016-09-27 DIAGNOSIS — Z7902 Long term (current) use of antithrombotics/antiplatelets: Secondary | ICD-10-CM

## 2016-09-27 DIAGNOSIS — F329 Major depressive disorder, single episode, unspecified: Secondary | ICD-10-CM | POA: Diagnosis present

## 2016-09-27 DIAGNOSIS — G8929 Other chronic pain: Secondary | ICD-10-CM | POA: Diagnosis present

## 2016-09-27 DIAGNOSIS — M797 Fibromyalgia: Secondary | ICD-10-CM | POA: Diagnosis present

## 2016-09-27 DIAGNOSIS — G4489 Other headache syndrome: Secondary | ICD-10-CM | POA: Diagnosis not present

## 2016-09-27 DIAGNOSIS — I6340 Cerebral infarction due to embolism of unspecified cerebral artery: Secondary | ICD-10-CM | POA: Diagnosis present

## 2016-09-27 DIAGNOSIS — Z8673 Personal history of transient ischemic attack (TIA), and cerebral infarction without residual deficits: Secondary | ICD-10-CM

## 2016-09-27 DIAGNOSIS — J440 Chronic obstructive pulmonary disease with acute lower respiratory infection: Secondary | ICD-10-CM | POA: Diagnosis present

## 2016-09-27 DIAGNOSIS — Z79899 Other long term (current) drug therapy: Secondary | ICD-10-CM

## 2016-09-27 DIAGNOSIS — R29818 Other symptoms and signs involving the nervous system: Secondary | ICD-10-CM | POA: Diagnosis not present

## 2016-09-27 DIAGNOSIS — I639 Cerebral infarction, unspecified: Secondary | ICD-10-CM | POA: Diagnosis not present

## 2016-09-27 DIAGNOSIS — R29706 NIHSS score 6: Secondary | ICD-10-CM | POA: Diagnosis present

## 2016-09-27 DIAGNOSIS — J208 Acute bronchitis due to other specified organisms: Secondary | ICD-10-CM | POA: Diagnosis not present

## 2016-09-27 DIAGNOSIS — M6281 Muscle weakness (generalized): Secondary | ICD-10-CM | POA: Diagnosis not present

## 2016-09-27 HISTORY — DX: Acute bronchitis, unspecified: J20.9

## 2016-09-27 HISTORY — DX: Essential (primary) hypertension: I10

## 2016-09-27 HISTORY — DX: Cerebral infarction due to embolism of unspecified cerebral artery: I63.40

## 2016-09-27 LAB — I-STAT TROPONIN, ED: TROPONIN I, POC: 0 ng/mL (ref 0.00–0.08)

## 2016-09-27 LAB — CBC
HEMATOCRIT: 42.8 % (ref 36.0–46.0)
HEMOGLOBIN: 14.7 g/dL (ref 12.0–15.0)
MCH: 30.5 pg (ref 26.0–34.0)
MCHC: 34.3 g/dL (ref 30.0–36.0)
MCV: 88.8 fL (ref 78.0–100.0)
Platelets: 302 10*3/uL (ref 150–400)
RBC: 4.82 MIL/uL (ref 3.87–5.11)
RDW: 13.2 % (ref 11.5–15.5)
WBC: 15.1 10*3/uL — ABNORMAL HIGH (ref 4.0–10.5)

## 2016-09-27 LAB — I-STAT CHEM 8, ED
BUN: 15 mg/dL (ref 6–20)
CALCIUM ION: 1.11 mmol/L — AB (ref 1.15–1.40)
CREATININE: 0.7 mg/dL (ref 0.44–1.00)
Chloride: 99 mmol/L — ABNORMAL LOW (ref 101–111)
GLUCOSE: 140 mg/dL — AB (ref 65–99)
HCT: 46 % (ref 36.0–46.0)
HEMOGLOBIN: 15.6 g/dL — AB (ref 12.0–15.0)
POTASSIUM: 3.3 mmol/L — AB (ref 3.5–5.1)
Sodium: 138 mmol/L (ref 135–145)
TCO2: 27 mmol/L (ref 0–100)

## 2016-09-27 LAB — URINALYSIS, ROUTINE W REFLEX MICROSCOPIC
Bilirubin Urine: NEGATIVE
Glucose, UA: NEGATIVE mg/dL
HGB URINE DIPSTICK: NEGATIVE
Ketones, ur: NEGATIVE mg/dL
LEUKOCYTES UA: NEGATIVE
NITRITE: NEGATIVE
PROTEIN: NEGATIVE mg/dL
Specific Gravity, Urine: 1.009 (ref 1.005–1.030)
pH: 5 (ref 5.0–8.0)

## 2016-09-27 LAB — COMPREHENSIVE METABOLIC PANEL
ALT: 35 U/L (ref 14–54)
AST: 28 U/L (ref 15–41)
Albumin: 3.8 g/dL (ref 3.5–5.0)
Alkaline Phosphatase: 86 U/L (ref 38–126)
Anion gap: 12 (ref 5–15)
BILIRUBIN TOTAL: 0.6 mg/dL (ref 0.3–1.2)
BUN: 12 mg/dL (ref 6–20)
CALCIUM: 9.1 mg/dL (ref 8.9–10.3)
CO2: 25 mmol/L (ref 22–32)
CREATININE: 0.85 mg/dL (ref 0.44–1.00)
Chloride: 100 mmol/L — ABNORMAL LOW (ref 101–111)
Glucose, Bld: 143 mg/dL — ABNORMAL HIGH (ref 65–99)
Potassium: 3.3 mmol/L — ABNORMAL LOW (ref 3.5–5.1)
Sodium: 137 mmol/L (ref 135–145)
TOTAL PROTEIN: 6.9 g/dL (ref 6.5–8.1)

## 2016-09-27 LAB — RAPID URINE DRUG SCREEN, HOSP PERFORMED
Amphetamines: NOT DETECTED
BARBITURATES: NOT DETECTED
Benzodiazepines: POSITIVE — AB
Cocaine: NOT DETECTED
Opiates: POSITIVE — AB
Tetrahydrocannabinol: NOT DETECTED

## 2016-09-27 LAB — PROTIME-INR
INR: 0.95
Prothrombin Time: 12.7 seconds (ref 11.4–15.2)

## 2016-09-27 LAB — DIFFERENTIAL
BASOS ABS: 0 10*3/uL (ref 0.0–0.1)
Basophils Relative: 0 %
Eosinophils Absolute: 0.1 10*3/uL (ref 0.0–0.7)
Eosinophils Relative: 0 %
LYMPHS ABS: 4.2 10*3/uL — AB (ref 0.7–4.0)
Lymphocytes Relative: 28 %
MONO ABS: 0.8 10*3/uL (ref 0.1–1.0)
MONOS PCT: 5 %
NEUTROS ABS: 10.1 10*3/uL — AB (ref 1.7–7.7)
Neutrophils Relative %: 67 %

## 2016-09-27 LAB — APTT: APTT: 23 s — AB (ref 24–36)

## 2016-09-27 LAB — ETHANOL

## 2016-09-27 MED ORDER — ACETAMINOPHEN 325 MG PO TABS: 650.0000 mg | ORAL_TABLET | ORAL | Status: DC | PRN

## 2016-09-27 MED ORDER — DIAZEPAM 5 MG PO TABS: 5.0000 mg | ORAL_TABLET | Freq: Three times a day (TID) | ORAL | Status: DC | PRN

## 2016-09-27 MED ORDER — ORPHENADRINE CITRATE ER 100 MG PO TB12
100.0000 mg | ORAL_TABLET | Freq: Two times a day (BID) | ORAL | Status: DC | PRN
  Administered 2016-09-30: 100 mg via ORAL
  Filled 2016-09-27 (×2): qty 1

## 2016-09-27 MED ORDER — TEMAZEPAM 15 MG PO CAPS
30.0000 mg | ORAL_CAPSULE | Freq: Every day | ORAL | Status: DC
  Administered 2016-09-27 – 2016-10-01 (×5): 30 mg via ORAL
  Filled 2016-09-27 (×5): qty 2

## 2016-09-27 MED ORDER — ACETAMINOPHEN 160 MG/5ML PO SOLN: 650.0000 mg | ORAL | Status: DC | PRN

## 2016-09-27 MED ORDER — FLUTICASONE FUROATE-VILANTEROL 200-25 MCG/INH IN AEPB
1.0000 | INHALATION_SPRAY | Freq: Every day | RESPIRATORY_TRACT | Status: DC
  Administered 2016-09-28 – 2016-10-01 (×3): 1 via RESPIRATORY_TRACT
  Filled 2016-09-27: qty 28

## 2016-09-27 MED ORDER — LEVOFLOXACIN 500 MG PO TABS
500.0000 mg | ORAL_TABLET | Freq: Every day | ORAL | Status: DC
  Administered 2016-09-28 – 2016-09-29 (×2): 500 mg via ORAL
  Filled 2016-09-27 (×4): qty 1

## 2016-09-27 MED ORDER — TIOTROPIUM BROMIDE MONOHYDRATE 1.25 MCG/ACT IN AERS: 1.0000 | INHALATION_SPRAY | Freq: Two times a day (BID) | RESPIRATORY_TRACT | Status: DC

## 2016-09-27 MED ORDER — STROKE: EARLY STAGES OF RECOVERY BOOK
Freq: Once | Status: AC
  Administered 2016-09-27: 22:00:00
  Filled 2016-09-27: qty 1

## 2016-09-27 MED ORDER — HYDROCODONE-ACETAMINOPHEN 5-325 MG PO TABS
1.0000 | ORAL_TABLET | Freq: Three times a day (TID) | ORAL | Status: DC | PRN
  Administered 2016-09-27 – 2016-09-30 (×10): 1 via ORAL
  Filled 2016-09-27 (×10): qty 1

## 2016-09-27 MED ORDER — PANTOPRAZOLE SODIUM 40 MG PO TBEC
80.0000 mg | DELAYED_RELEASE_TABLET | Freq: Every day | ORAL | Status: DC
  Administered 2016-09-28 – 2016-10-02 (×5): 80 mg via ORAL
  Filled 2016-09-27 (×5): qty 2

## 2016-09-27 MED ORDER — FAMOTIDINE 20 MG PO TABS
40.0000 mg | ORAL_TABLET | Freq: Every day | ORAL | Status: DC
  Administered 2016-09-27 – 2016-10-01 (×5): 40 mg via ORAL
  Filled 2016-09-27 (×5): qty 2

## 2016-09-27 MED ORDER — LORAZEPAM 2 MG/ML IJ SOLN
1.0000 mg | Freq: Once | INTRAMUSCULAR | Status: AC
  Administered 2016-09-27: 1 mg via INTRAVENOUS
  Filled 2016-09-27: qty 1

## 2016-09-27 MED ORDER — DICYCLOMINE HCL 20 MG PO TABS: 20.0000 mg | ORAL_TABLET | Freq: Four times a day (QID) | ORAL | Status: DC | PRN

## 2016-09-27 MED ORDER — ALBUTEROL SULFATE (2.5 MG/3ML) 0.083% IN NEBU: 2.5000 mg | INHALATION_SOLUTION | RESPIRATORY_TRACT | Status: DC | PRN

## 2016-09-27 MED ORDER — VENLAFAXINE HCL 75 MG PO TABS
75.0000 mg | ORAL_TABLET | Freq: Every day | ORAL | Status: DC
  Administered 2016-09-28: 75 mg via ORAL
  Filled 2016-09-27 (×2): qty 1
  Filled 2016-09-27: qty 2

## 2016-09-27 MED ORDER — TIOTROPIUM BROMIDE MONOHYDRATE 18 MCG IN CAPS
18.0000 ug | ORAL_CAPSULE | Freq: Every day | RESPIRATORY_TRACT | Status: DC
  Administered 2016-09-30: 18 ug via RESPIRATORY_TRACT
  Filled 2016-09-27: qty 5

## 2016-09-27 MED ORDER — HYDROCODONE-ACETAMINOPHEN 5-325 MG PO TABS
1.0000 | ORAL_TABLET | Freq: Once | ORAL | Status: AC
  Administered 2016-09-27: 1 via ORAL
  Filled 2016-09-27: qty 1

## 2016-09-27 MED ORDER — ENOXAPARIN SODIUM 40 MG/0.4ML ~~LOC~~ SOLN
40.0000 mg | Freq: Every day | SUBCUTANEOUS | Status: DC
  Administered 2016-09-27 – 2016-10-01 (×5): 40 mg via SUBCUTANEOUS
  Filled 2016-09-27 (×5): qty 0.4

## 2016-09-27 MED ORDER — ACETAMINOPHEN 650 MG RE SUPP: 650.0000 mg | RECTAL | Status: DC | PRN

## 2016-09-27 MED ORDER — CLOPIDOGREL BISULFATE 75 MG PO TABS
75.0000 mg | ORAL_TABLET | Freq: Every day | ORAL | Status: DC
  Administered 2016-09-27 – 2016-10-02 (×6): 75 mg via ORAL
  Filled 2016-09-27 (×6): qty 1

## 2016-09-27 NOTE — ED Provider Notes (Signed)
[facility] DEPT Provider Note   CSN: [id number] Arrival date & time: 09/27/16  1555     History   Chief Complaint Chief Complaint  Patient presents with  . Weakness  . Headache    HPI Lynn Rollins is a 51 y.o. female.  HPI  50yF with headache and R sided weakness. She has been having headaches for days to week. Around 1130 today began having waxing/wanig R sided weakness. Symptoms persist since. No acute visual complaints. Made Code Stroke on arrival and evaluated by neurology.  Past Medical History:  Diagnosis Date  . Anxiety   . Asthma   . Chronic fatigue   . COPD (chronic obstructive pulmonary disease) ([place])   . Depression   . Fibromyalgia   . Hypertension   . Stroke [facility])     There are no active problems to display for this patient.   Past Surgical History:  Procedure Laterality Date  . ABDOMINAL HYSTERECTOMY    . CHOLECYSTECTOMY    . TUBAL LIGATION      OB History    No data available       Home Medications    Prior to Admission medications   Medication Sig Start Date End Date Taking? Authorizing Provider  albuterol (PROVENTIL HFA;VENTOLIN HFA) 108 (90 BASE) MCG/ACT inhaler Inhale 2 puffs into the lungs every 4 (four) hours as needed. For wheezing    [provider]  ALPRAZolam (XANAX) 0.25 MG tablet Take 0.25 mg by mouth 3 (three) times daily as needed. For anxiety    [provider]  citalopram (CELEXA) 20 MG tablet Take 20 mg by mouth daily.    [provider]  clopidogrel (PLAVIX) 75 MG tablet Take 75 mg by mouth daily.    [provider]  cyclobenzaprine (FLEXERIL) 10 MG tablet Take 10 mg by mouth 3 (three) times daily as needed. For spasm    [provider]  dicyclomine (BENTYL) 20 MG tablet Take 20 mg by mouth every 6 (six) hours.    [provider]  HYDROcodone-acetaminophen (LORTAB) 10-500 MG per tablet Take 1 tablet by mouth 4 (four) times daily.    [provider]    lisinopril (PRINIVIL,ZESTRIL) 40 MG tablet Take 40 mg by mouth daily.    [provider]  mupirocin ointment (BACTROBAN) 2 % Apply 1 application topically 3 (three) times daily.    [provider]  omeprazole (PRILOSEC) 20 MG capsule Take 20 mg by mouth 2 (two) times daily.    [provider]  orphenadrine (NORFLEX) 100 MG tablet Take 100 mg by mouth 2 (two) times daily as needed. For spasm    [provider]  promethazine (PHENERGAN) 25 MG tablet Take 25 mg by mouth every 6 (six) hours as needed. For nausea    [provider]  ranitidine (ZANTAC) 150 MG tablet Take 300 mg by mouth daily.    [provider]  topiramate (TOPAMAX) 50 MG tablet Take 50 mg by mouth 2 (two) times daily.    [provider]    Family History No family history on file.  Social History Social History  Substance Use Topics  . Smoking status: Never Smoker  . Smokeless tobacco: Never Used  . Alcohol use No     Allergies   Patient has no known allergies.   Review of Systems Review of Systems  All systems reviewed and negative, other than as noted in HPI.   Physical Exam Updated Vital Signs BP 130/77  Pulse 68   Temp 97.7 F (36.5 C) (Oral)   Resp 17   Ht 4\' 10"  (1.473 m)   Wt 83.9 kg (185 lb)   SpO2 95%   BMI 38.67 kg/m   Physical Exam  Constitutional: She is oriented to person, place, and time. She appears well-developed and well-nourished. No distress.  HENT:  Head: Normocephalic and atraumatic.  Eyes: Conjunctivae and EOM are normal. Pupils are equal, round, and reactive to light. Right eye exhibits no discharge. Left eye exhibits no discharge.  Neck: Neck supple.  Cardiovascular: Normal rate, regular rhythm and normal heart sounds.  Exam reveals no gallop and no friction rub.   No murmur heard. Pulmonary/Chest: Effort normal and breath sounds normal. No respiratory distress.  Abdominal: Soft. She exhibits no distension.  There is no tenderness.  Musculoskeletal: She exhibits no edema or tenderness.  Neurological: She is alert and oriented to person, place, and time. No cranial nerve deficit. She exhibits normal muscle tone. Coordination normal.  Skin: Skin is warm and dry.  Psychiatric: She has a normal mood and affect. Her behavior is normal. Thought content normal.  Nursing note and vitals reviewed.    ED Treatments / Results  Labs (all labs ordered are listed, but only abnormal results are displayed) Labs Reviewed  APTT - Abnormal; Notable for the following:       Result Value   aPTT 23 (*)    All other components within normal limits  CBC - Abnormal; Notable for the following:    WBC 15.1 (*)    All other components within normal limits  DIFFERENTIAL - Abnormal; Notable for the following:    Neutro Abs 10.1 (*)    Lymphs Abs 4.2 (*)    All other components within normal limits  COMPREHENSIVE METABOLIC PANEL - Abnormal; Notable for the following:    Potassium 3.3 (*)    Chloride 100 (*)    Glucose, Bld 143 (*)    All other components within normal limits  RAPID URINE DRUG SCREEN, HOSP PERFORMED - Abnormal; Notable for the following:    Opiates POSITIVE (*)    Benzodiazepines POSITIVE (*)    All other components within normal limits  HEMOGLOBIN A1C - Abnormal; Notable for the following:    Hgb A1c MFr Bld 5.7 (*)    All other components within normal limits  LIPID PANEL - Abnormal; Notable for the following:    Triglycerides 256 (*)    VLDL 51 (*)    All other components within normal limits  CBC - Abnormal; Notable for the following:    WBC 10.6 (*)    All other components within normal limits  BASIC METABOLIC PANEL - Abnormal; Notable for the following:    Glucose, Bld 114 (*)    All other components within normal limits  BASIC METABOLIC PANEL - Abnormal; Notable for the following:    Glucose, Bld 113 (*)    All other components within normal limits  I-STAT CHEM 8, ED - Abnormal;  Notable for the following:    Potassium 3.3 (*)    Chloride 99 (*)    Glucose, Bld 140 (*)    Calcium, Ion 1.11 (*)    Hemoglobin 15.6 (*)    All other components within normal limits  ETHANOL  PROTIME-INR  URINALYSIS, ROUTINE W REFLEX MICROSCOPIC  HIV ANTIBODY (ROUTINE TESTING)  MAGNESIUM  SEDIMENTATION RATE  C-REACTIVE PROTEIN  LUPUS ANTICOAGULANT PANEL  BETA-2-GLYCOPROTEIN I ABS, IGG/M/A  HOMOCYSTEINE  CARDIOLIPIN ANTIBODIES,  IGG, IGM, IGA  ANTINUCLEAR ANTIBODIES, IFA  ANTI-DNA ANTIBODY, DOUBLE-STRANDED  I-STAT TROPOININ, ED    EKG  EKG Interpretation  Date/Time:  Thursday Sep 27 2016 16:01:48 EDT Ventricular Rate:  63 PR Interval:    QRS Duration: 106 QT Interval:  451 QTC Calculation: 462 R Axis:   -7 Text Interpretation:  Sinus rhythm Low voltage, precordial leads Abnormal R-wave progression, early transition Left ventricular hypertrophy Borderline T abnormalities, inferior leads Confirmed by Wilson Singer  MD, Krisann Mckenna ([id number]) on 09/27/2016 4:41:45 PM       Radiology No results found.   Dg Chest 2 View  Result Date: 09/27/2016 CLINICAL DATA:  Acute ischemic left ACA stroke. Right-sided weakness. EXAM: CHEST  2 VIEW COMPARISON:  07/05/2013 CXR FINDINGS: The heart size and mediastinal contours are within normal limits. Both lungs are clear. The visualized skeletal structures are unremarkable. IMPRESSION: No active cardiopulmonary disease. Electronically Signed   By: Ashley Royalty M.D.   On: 09/27/2016 22:30   Mr Angiogram Head Wo Contrast  Result Date: 09/27/2016 CLINICAL DATA:  51 year old female with right side deficits, headache. Recent diplopia. EXAM: MRI HEAD WITHOUT CONTRAST MRA HEAD WITHOUT CONTRAST TECHNIQUE: Multiplanar, multiecho pulse sequences of the brain and surrounding structures were obtained without intravenous contrast. Angiographic images of the head were obtained using MRA technique without contrast. COMPARISON:  Head CT without contrast 1628 hours today.  Brain MRI 09/28/2010. FINDINGS: MRI HEAD FINDINGS Brain: Small bilateral basal ganglia lacunar infarcts are chronic but have developed since 2012. There is a small 2 cm area of restricted diffusion in the cortex of the left medial left motor strip near midline. Restricted diffusion tracks into the nearby subcortical white matter. See series 3 images 38 through 41. There is also a small focus of restricted diffusion in the more anterior left cingulate gyrus on image 37. Mild associated T2 and FLAIR hyperintensity. No hemorrhage or mass effect. No other restricted diffusion. Chronic T2 and FLAIR hyperintensity in the posterior left temporal lobe is unchanged since 2012 and now favored to be post ischemic encephalomalacia. Occasional scattered subcortical white matter foci of T2 and FLAIR hyperintensity elsewhere have not significantly changed since 2012. However, there is a new but chronic appearing area of abnormal T2 and FLAIR hyperintensity in the left lateral cerebellar peduncle on series 6, image 6. No chronic cerebral blood products identified. No midline shift, mass effect, evidence of mass lesion, ventriculomegaly, extra-axial collection or acute intracranial hemorrhage. Cervicomedullary junction and pituitary are within normal limits. Vascular: Major intracranial vascular flow voids are stable since 2012 and appear normal. Skull and upper cervical spine: Negative. Normal bone marrow signal. Sinuses/Orbits: Stable and negative. Other: Visible internal auditory structures appear normal. Mastoids are clear. Negative scalp soft tissues. MRA HEAD FINDINGS Antegrade flow in the posterior circulation. Normal distal vertebral arteries. Normal basilar artery. SCA and left PCA origins are normal. Fetal type right PCA origin. Left posterior communicating artery is diminutive. Bilateral PCA branches are normal. Antegrade flow in both ICA siphons. No siphon stenosis. Normal ophthalmic and posterior communicating artery  origins. Normal carotid termini, MCA and ACA origins. The left ACA is occluded in the proximal A2 segment. The anterior communicating artery might be fenestrated where there might be a filling defect at the left A2 origin on series 4, image 105. There is brief reconstitution of the more distal A2 segment before it appears occluded once more. The right ACA appears normal. Bilateral MCA origins, M1 segments, MCA bifurcations, and visible bilateral MCA branches are within  normal limits. IMPRESSION: 1. Acute left ACA territory infarcts, primarily the distal territory at the right lower extremity representation area. No associated hemorrhage or mass effect. 2. Associated left A2 occlusion. Otherwise negative intracranial MRA. 3. Bilateral basal ganglia lacunar infarcts have occurred since 2012. Chronic signal abnormality in the posterior left temporal lobe now favored to be post ischemic encephalomalacia, and there is a new similar area of encephalomalacia in the left cerebellar peduncle. 4. Given the above, consider a cardiac or paradoxical embolic source such as patent foraminal ovale. This study was discussed by telephone with Dr. Roland Rack on 09/27/2016 at 20:34 . Electronically Signed   By: Genevie Ann M.D.   On: 09/27/2016 20:35   Mr Brain Wo Contrast  Result Date: 09/27/2016 CLINICAL DATA:  51 year old female with right side deficits, headache. Recent diplopia. EXAM: MRI HEAD WITHOUT CONTRAST MRA HEAD WITHOUT CONTRAST TECHNIQUE: Multiplanar, multiecho pulse sequences of the brain and surrounding structures were obtained without intravenous contrast. Angiographic images of the head were obtained using MRA technique without contrast. COMPARISON:  Head CT without contrast 1628 hours today. Brain MRI 09/28/2010. FINDINGS: MRI HEAD FINDINGS Brain: Small bilateral basal ganglia lacunar infarcts are chronic but have developed since 2012. There is a small 2 cm area of restricted diffusion in the cortex of the  left medial left motor strip near midline. Restricted diffusion tracks into the nearby subcortical white matter. See series 3 images 38 through 41. There is also a small focus of restricted diffusion in the more anterior left cingulate gyrus on image 37. Mild associated T2 and FLAIR hyperintensity. No hemorrhage or mass effect. No other restricted diffusion. Chronic T2 and FLAIR hyperintensity in the posterior left temporal lobe is unchanged since 2012 and now favored to be post ischemic encephalomalacia. Occasional scattered subcortical white matter foci of T2 and FLAIR hyperintensity elsewhere have not significantly changed since 2012. However, there is a new but chronic appearing area of abnormal T2 and FLAIR hyperintensity in the left lateral cerebellar peduncle on series 6, image 6. No chronic cerebral blood products identified. No midline shift, mass effect, evidence of mass lesion, ventriculomegaly, extra-axial collection or acute intracranial hemorrhage. Cervicomedullary junction and pituitary are within normal limits. Vascular: Major intracranial vascular flow voids are stable since 2012 and appear normal. Skull and upper cervical spine: Negative. Normal bone marrow signal. Sinuses/Orbits: Stable and negative. Other: Visible internal auditory structures appear normal. Mastoids are clear. Negative scalp soft tissues. MRA HEAD FINDINGS Antegrade flow in the posterior circulation. Normal distal vertebral arteries. Normal basilar artery. SCA and left PCA origins are normal. Fetal type right PCA origin. Left posterior communicating artery is diminutive. Bilateral PCA branches are normal. Antegrade flow in both ICA siphons. No siphon stenosis. Normal ophthalmic and posterior communicating artery origins. Normal carotid termini, MCA and ACA origins. The left ACA is occluded in the proximal A2 segment. The anterior communicating artery might be fenestrated where there might be a filling defect at the left A2 origin  on series 4, image 105. There is brief reconstitution of the more distal A2 segment before it appears occluded once more. The right ACA appears normal. Bilateral MCA origins, M1 segments, MCA bifurcations, and visible bilateral MCA branches are within normal limits. IMPRESSION: 1. Acute left ACA territory infarcts, primarily the distal territory at the right lower extremity representation area. No associated hemorrhage or mass effect. 2. Associated left A2 occlusion. Otherwise negative intracranial MRA. 3. Bilateral basal ganglia lacunar infarcts have occurred since 2012. Chronic signal abnormality  in the posterior left temporal lobe now favored to be post ischemic encephalomalacia, and there is a new similar area of encephalomalacia in the left cerebellar peduncle. 4. Given the above, consider a cardiac or paradoxical embolic source such as patent foraminal ovale. This study was discussed by telephone with Dr. Roland Rack on 09/27/2016 at 20:34 . Electronically Signed   By: Genevie Ann M.D.   On: 09/27/2016 20:35   Ct Head Code Stroke W/o Cm  Result Date: 09/27/2016 CLINICAL DATA:  Code stroke. 51 year old female with right side paresis, double vision. EXAM: CT HEAD WITHOUT CONTRAST TECHNIQUE: Contiguous axial images were obtained from the base of the skull through the vertex without intravenous contrast. COMPARISON:  Head and cervical spine CT 07/05/2013 and earlier. FINDINGS: Brain: No acute intracranial hemorrhage identified. No midline shift, mass effect, or evidence of intracranial mass lesion. Gray-white matter differentiation appears stable and within normal limits throughout the brain. No cortically based acute infarct identified. No ventriculomegaly. Vascular: Symmetric appearing intracranial vascular density. No convincing abnormal intracranial vascular hyperdensity. Skull: Negative.  No acute osseous abnormality identified. Sinuses/Orbits: Visualized paranasal sinuses and mastoids are stable and  well pneumatized. Other: Visualized orbits and scalp soft tissues are within normal limits. ASPECTS ([place] Stroke Program Early CT Score) - Ganglionic level infarction (caudate, lentiform nuclei, internal capsule, insula, M1-M3 cortex): 7 - Supraganglionic infarction (M4-M6 cortex): 3 Total score (0-10 with 10 being normal): 10 IMPRESSION: 1. Stable and normal noncontrast CT appearance of the brain. 2. ASPECTS is 10. 3. The above was relayed via text pager to Dr. Bruce Donath on 09/27/2016 at 16:33 . Electronically Signed   By: Genevie Ann M.D.   On: 09/27/2016 16:33    Procedures Procedures (including critical care time)  Medications Ordered in ED Medications - No data to display   Initial Impression / Assessment and Plan / ED Course  I have reviewed the triage vital signs and the nursing notes.  Pertinent labs & imaging results that were available during my care of the patient were reviewed by me and considered in my medical decision making (see chart for details).     50yF with headache and R sided weakness. MR with acute stroke. Admit for further w/u.   Final Clinical Impressions(s) / ED Diagnoses   Final diagnoses:  Acute ischemic left ACA stroke [facility])    New Prescriptions New Prescriptions   No medications on file     Virgel Manifold, MD 10/01/16 [phone]

## 2016-09-27 NOTE — ED Notes (Signed)
EMS reports that PT has history of stroke and HTN. They report PT has had a severe headache and hypertensive BP readings for 2 days. PT saw pulmonologist Tuesday. PT saw PCP Wednesday for headache and hypertension. PCP doubled her BP med doses yesterday per EMS. PT reports slight right sided weakness yesterday and 2 episodes of double vision that lasted 5 minutes each. Today, PT fell around noon. PT was dizzy, felt like she was "pushed down", and could not move right side. PT was able to walk to stretcher with assistance from EMS. Upon arrival to ED PT exhibits obvious weakness in right arm and right leg. 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

## 2016-09-27 NOTE — ED Notes (Signed)
Code stroke cancelled per Dr. Cheral Marker. Cancelled by [place], Network engineer.

## 2016-09-27 NOTE — Consult Note (Signed)
Referring Physician: Dr. Wilson Singer    Chief Complaint: Acute onset of right sided weakness  HPI: Lynn Rollins is an 51 y.o. female who presented to the ED with new onset right sided weakness. Her symptoms began at home at about 11:30 AM today. Code Stroke was called in the ED.   LSN: 11:30 AM tPA Given: No: Out of tPA time window Endovascular procedure: Felt not to be an endovacular candidate due to rapid improvement and low NIHSS with resultant unacceptably high risk of complications, including subarachnoid hemorrhage. The patient's husband expressed understanding with the description of risk/benefit profile. The patient elected to defer decision making to her husband, who expressed desire not to proceed with catheter based angiogram due to risks.   Past Medical History:  Diagnosis Date  . Anxiety   . Asthma   . Chronic fatigue   . COPD (chronic obstructive pulmonary disease) ([place])   . Depression   . Fibromyalgia   . Hypertension   . Stroke [facility])     Past Surgical History:  Procedure Laterality Date  . ABDOMINAL HYSTERECTOMY    . CHOLECYSTECTOMY    . TUBAL LIGATION      No family history on file. Social History:  reports that she has never smoked. She has never used smokeless tobacco. She reports that she does not drink alcohol or use drugs.  Allergies: No Known Allergies  Medications: albuterol (PROVENTIL HFA;VENTOLIN HFA) 108 (90 BASE) MCG/ACT inhaler Inhale 2 puffs into the lungs every 4 (four) hours as needed. For wheezing [provider] Needs Review  ALPRAZolam (XANAX) 0.25 MG tablet Take 0.25 mg by mouth 3 (three) times daily as needed. For anxiety [provider] Needs Review  citalopram (CELEXA) 20 MG tablet Take 20 mg by mouth daily. [provider] Needs Review  clopidogrel (PLAVIX) 75 MG tablet Take 75 mg by mouth daily. [provider] Needs Review  cyclobenzaprine (FLEXERIL) 10 MG tablet Take 10 mg by mouth 3 (three) times  daily as needed. For spasm [provider] Needs Review  dicyclomine (BENTYL) 20 MG tablet Take 20 mg by mouth every 6 (six) hours. [provider] Needs Review  HYDROcodone-acetaminophen (LORTAB) 10-500 MG per tablet Take 1 tablet by mouth 4 (four) times daily. [provider] Needs Review  lisinopril (PRINIVIL,ZESTRIL) 40 MG tablet Take 40 mg by mouth daily. [provider] Needs Review  mupirocin ointment (BACTROBAN) 2 % Apply 1 application topically 3 (three) times daily. [provider] Needs Review  omeprazole (PRILOSEC) 20 MG capsule Take 20 mg by mouth 2 (two) times daily. [provider] Needs Review  orphenadrine (NORFLEX) 100 MG tablet Take 100 mg by mouth 2 (two) times daily as needed. For spasm [provider] Needs Review  promethazine (PHENERGAN) 25 MG tablet Take 25 mg by mouth every 6 (six) hours as needed. For nausea [provider] Needs Review  ranitidine (ZANTAC) 150 MG tablet Take 300 mg by mouth daily. [provider] Needs Review  topiramate (TOPAMAX) 50 MG tablet Take 50 mg by mouth 2 (two) times daily. [provider] Needs Review    ROS: Deferred due to acuity of presentation.   Physical Examination: Blood pressure 130/77, pulse 68, temperature 97.7 F (36.5 C), temperature source Oral, resp. rate 17, height 4\' 10"  (1.473 m), weight 83.9 kg (185 lb), SpO2 95 %.  HEENT: [place]/AT Lungs: Respirations unlabored Ext: Warm and well perfused.   Neurologic Examination: Mental Status: Alert, oriented, thought content appropriate.  Speech fluent without evidence of aphasia.  Able to follow all commands without difficulty. Cranial Nerves: II:  Visual fields intact bilaterally. PERRL.   III,IV, VI: ptosis not present, EOMI without nystagmus V,VII: smile symmetric, facial temperature sensation normal bilaterally VIII: hearing intact to voice IX,X:Palate rises symmetrically XI:  Symmetric XII: midline tongue extension  Motor: RUE 4/5 proximal and distal RLE: 3-4/5 proximal and distal, improving to 5/5 LUE and LLE: 5/5  Sensory: Decreased temp and light touch to RLE Deep Tendon Reflexes:  Normoactive x 4.  Plantars: Right: downgoing   Left: downgoing Cerebellar: No ataxia with FNF bilaterally Gait: Deferred  No results found for this or any previous visit (from the past 48 hour(s)). No results found.  Assessment: 51 y.o. female with acute onset of right sided weakness.  1. In CT became anxious with ability to flex RLE at hip and knee spontaneously, while scratching LLE with her right toe. This occurred 30 seconds after an apparent inability to move RLE against resistance by examiner.  2. CT head normal 3. Out of IV tPA time window 4. Felt not to be an endovacular candidate due to rapid improvement and low NIHSS with resultant unacceptably high risk of complications, including subarachnoid hemorrhage. The patient's husband expressed understanding with the description of risk/benefit profile. The patient elected to defer decision making to her husband, who expressed desire not to proceed with catheter based angiogram due to risks. 5. Stroke Risk Factors - Prior stroke and HTN 6. Statin allergy  Plan:  MRI brain. If positive, obtain remainder of stroke work up  Addendum: MRI brain positive for small left ACA territory ischemic infarctions. Will need to obtain remainder of CVA workup including HgbA1c, fasting lipid panel, MRA of the brain without contrast, TTE and carotid ultrasound. Also obtain PT consult, OT consult, Speech consult. Continue Plavix for now. May need to escalate to DAPT or anticoagulant pending results of work up. Telemetry monitoring. Frequent neuro checks. Permissive HTN x 24 hours.      Electronically signed: Dr. Kerney Elbe 09/27/2016, 4:24 PM

## 2016-09-27 NOTE — ED Notes (Signed)
Pt back in room.

## 2016-09-27 NOTE — Code Documentation (Signed)
51 y.o. Female with PMHx of CVA with right sided deficits and HTN who stated she had had headaches for the past days and two episodes of diplopia last night that each resolved after about 5 minutes. Today she states she had BLE weakness, R>L, that caused her to fall. On arrival, patient c/o 8/10 headache, dizziness, RUE can't resist gravity, RLE with no effort against gravity and she reports numbness to her right side. NIHSS 6. See EMR for NIHSS and code stroke times. CT showing no acute intracranial abnormalities. ASPECTS 10. On exam patient with inconsistent exam and poor effort. tPA not given d/t stroke not suspected. Code stroke canceled by neurologist. MRI to be obtained. ED bedside handoff with ED RN Baldo Ash.

## 2016-09-27 NOTE — ED Notes (Signed)
Care handoff to [facility]

## 2016-09-27 NOTE — ED Notes (Addendum)
Dr. Roderic Palau made aware of PT's complaints of right sided weakness and time of onset of this episode. PT exhibits obvious right sided deficits.

## 2016-09-27 NOTE — ED Notes (Signed)
Patient transported to X-ray 

## 2016-09-27 NOTE — ED Notes (Signed)
Patient transported to MRI 

## 2016-09-27 NOTE — ED Notes (Signed)
PT assisted to bedside toilet for BM and to urinate. PT returns to bed and placed back on monitor. PT requests pain med, Dr. Wilson Singer made aware

## 2016-09-27 NOTE — ED Notes (Signed)
ED Provider at bedside. 

## 2016-09-27 NOTE — H&P (Signed)
History and Physical    Lynn Rollins ENI:[id number] DOB: March 28, 1966 DOA: 09/27/2016  PCP: Nicoletta Dress, MD  Patient coming from: Home  I have personally briefly reviewed patient's old medical records in [place]  Chief Complaint: R sided weakness  HPI: Lynn Rollins is a 51 y.o. female with medical history significant of HTN, depression, fibromyalgia, stroke.  Patient presents to the ED with R sided weakness.  Symptoms onset at about 11:30 AM today.  Symptoms are persistent, nothing makes better or worse.   ED Course: MRI brain showed occlusion of L ACA and acute stroke.   Review of Systems: As per HPI otherwise 10 point review of systems negative.   Past Medical History:  Diagnosis Date  . Anxiety   . Asthma   . Chronic fatigue   . COPD (chronic obstructive pulmonary disease) ([place])   . Depression   . Fibromyalgia   . Hypertension   . Stroke [facility])     Past Surgical History:  Procedure Laterality Date  . ABDOMINAL HYSTERECTOMY    . CHOLECYSTECTOMY    . TUBAL LIGATION       reports that she has never smoked. She has never used smokeless tobacco. She reports that she does not drink alcohol or use drugs.  Allergies  Allergen Reactions  . Statins Other (See Comments)    Patient cannot recall reaction  . Tape Other (See Comments)    Patient has Fibromyalgia and cannot tolerate medical tape and it's removal    No family history on file.   Prior to Admission medications   Medication Sig Start Date End Date Taking? Authorizing Provider  albuterol (PROVENTIL HFA;VENTOLIN HFA) 108 (90 BASE) MCG/ACT inhaler Inhale 2 puffs into the lungs every 4 (four) hours as needed for wheezing.    Yes [provider]  amLODipine (NORVASC) 10 MG tablet Take 10 mg by mouth daily. 09/26/16  Yes [provider]  BREO ELLIPTA 200-25 MCG/INH AEPB Inhale 1 puff into the lungs daily. 08/24/16  Yes [provider]  citalopram (CELEXA) 20 MG tablet  Take 20 mg by mouth daily as needed (for mood).    Yes [provider]  clopidogrel (PLAVIX) 75 MG tablet Take 75 mg by mouth daily.   Yes [provider]  DEXILANT 60 MG capsule Take 60 mg by mouth daily. 08/30/16  Yes [provider]  diazepam (VALIUM) 5 MG tablet Take 5 mg by mouth 3 (three) times daily as needed for anxiety.   Yes [provider]  dicyclomine (BENTYL) 20 MG tablet Take 20 mg by mouth every 6 (six) hours as needed for spasms.    Yes [provider]  furosemide (LASIX) 40 MG tablet Take 40 mg by mouth 2 (two) times daily. 09/19/16  Yes [provider]  HYDROcodone-acetaminophen (NORCO/VICODIN) 5-325 MG tablet Take 1 tablet by mouth 3 (three) times daily. 08/29/16  Yes [provider]  labetalol (NORMODYNE) 300 MG tablet Take 300 mg by mouth 2 (two) times daily. 09/01/16  Yes [provider]  levofloxacin (LEVAQUIN) 500 MG tablet Take 500 mg by mouth daily. For 10 days 09/25/16  Yes [provider]  lisinopril (PRINIVIL,ZESTRIL) 40 MG tablet Take 40 mg by mouth daily.   Yes [provider]  mupirocin ointment (BACTROBAN) 2 % Apply 1 application topically 3 (three) times daily.   Yes [provider]  orphenadrine (NORFLEX) 100 MG tablet Take 100 mg by mouth 2 (two) times daily as  needed for muscle spasms.    Yes [provider]  predniSONE (DELTASONE) 10 MG tablet See admin instructions. TAKE 4 TABS X3 DAYS THEN 3 TABS X3 DAYS THEN 2 TABS X3 DAYS THEN 1 TAB X3 DAYS THEN STOP 09/25/16  Yes [provider]  promethazine (PHENERGAN) 25 MG tablet Take 25 mg by mouth every 6 (six) hours as needed for nausea.    Yes [provider]  ranitidine (ZANTAC) 300 MG tablet Take 300 mg by mouth at bedtime. 08/20/16  Yes [provider]  SPIRIVA RESPIMAT 1.25 MCG/ACT AERS Inhale 1 puff into the lungs 2 (two) times daily. 09/11/16  Yes [provider]  temazepam  (RESTORIL) 30 MG capsule Take 30 mg by mouth at bedtime. 09/25/16  Yes [provider]  venlafaxine (EFFEXOR) 75 MG tablet Take 75 mg by mouth daily. 07/30/16  Yes [provider]  Vitamin D, Ergocalciferol, (DRISDOL) 50000 units CAPS capsule Take 50,000 Units by mouth once a week. 09/17/16  Yes [provider]  cloNIDine (CATAPRES) 0.1 MG tablet Take 0.1 mg by mouth 3 (three) times daily. 09/26/16   [provider]    Physical Exam: Vitals:   09/27/16 1815 09/27/16 1830 09/27/16 1915 09/27/16 2130  BP: (!) 144/70 135/82 123/76 120/70  Pulse: 66 63 67 66  Resp: 14 11 15  (!) 21  Temp:      TempSrc:      SpO2: 95% 94% 96% 92%  Weight:      Height:        Constitutional: NAD, calm, comfortable Eyes: PERRL, lids and conjunctivae normal ENMT: Mucous membranes are moist. Posterior pharynx clear of any exudate or lesions.Normal dentition.  Neck: normal, supple, no masses, no thyromegaly Respiratory: clear to auscultation bilaterally, no wheezing, no crackles. Normal respiratory effort. No accessory muscle use.  Cardiovascular: Regular rate and rhythm, no murmurs / rubs / gallops. No extremity edema. 2+ pedal pulses. No carotid bruits.  Abdomen: no tenderness, no masses palpated. No hepatosplenomegaly. Bowel sounds positive.  Musculoskeletal: no clubbing / cyanosis. No joint deformity upper and lower extremities. Good ROM, no contractures. Normal muscle tone.  Skin: no rashes, lesions, ulcers. No induration Neurologic: RUE and RLE 4/5 Psychiatric: Normal judgment and insight. Alert and oriented x 3. Normal mood.    Labs on Admission: I have personally reviewed following labs and imaging studies  CBC:  Recent Labs Lab 09/27/16 1611 09/27/16 1625  WBC 15.1*  --   NEUTROABS 10.1*  --   HGB 14.7 15.6*  HCT 42.8 46.0  MCV 88.8  --   PLT 302  --    Basic Metabolic Panel:  Recent Labs Lab 09/27/16 1611 09/27/16 1625  NA 137 138  K 3.3* 3.3*  CL  100* 99*  CO2 25  --   GLUCOSE 143* 140*  BUN 12 15  CREATININE 0.85 0.70  CALCIUM 9.1  --    GFR: Estimated Creatinine Clearance: 77.2 mL/min (by C-G formula based on SCr of 0.7 mg/dL). Liver Function Tests:  Recent Labs Lab 09/27/16 1611  AST 28  ALT 35  ALKPHOS 86  BILITOT 0.6  PROT 6.9  ALBUMIN 3.8   No results for input(s): LIPASE, AMYLASE in the last 168 hours. No results for input(s): AMMONIA in the last 168 hours. Coagulation Profile:  Recent Labs Lab 09/27/16 1611  INR 0.95   Cardiac Enzymes: No results for input(s): CKTOTAL, CKMB, CKMBINDEX, TROPONINI in the last 168 hours. BNP (last 3 results) No results  for input(s): PROBNP in the last 8760 hours. HbA1C: No results for input(s): HGBA1C in the last 72 hours. CBG: No results for input(s): GLUCAP in the last 168 hours. Lipid Profile: No results for input(s): CHOL, HDL, LDLCALC, TRIG, CHOLHDL, LDLDIRECT in the last 72 hours. Thyroid Function Tests: No results for input(s): TSH, T4TOTAL, FREET4, T3FREE, THYROIDAB in the last 72 hours. Anemia Panel: No results for input(s): VITAMINB12, FOLATE, FERRITIN, TIBC, IRON, RETICCTPCT in the last 72 hours. Urine analysis:    Component Value Date/Time   COLORURINE YELLOW 09/27/2016 [place] 09/27/2016 1748   LABSPEC 1.009 09/27/2016 1748   PHURINE 5.0 09/27/2016 [place] 09/27/2016 [place] 09/27/2016 [place] 09/27/2016 [place] 09/27/2016 1748   PROTEINUR NEGATIVE 09/27/2016 1748   UROBILINOGEN 0.2 08/22/2011 1711   NITRITE NEGATIVE 09/27/2016 1748   LEUKOCYTESUR NEGATIVE 09/27/2016 1748    Radiological Exams on Admission: Mr Angiogram Head Wo Contrast  Result Date: 09/27/2016 CLINICAL DATA:  51 year old female with right side deficits, headache. Recent diplopia. EXAM: MRI HEAD WITHOUT CONTRAST MRA HEAD WITHOUT CONTRAST TECHNIQUE: Multiplanar, multiecho pulse sequences of  the brain and surrounding structures were obtained without intravenous contrast. Angiographic images of the head were obtained using MRA technique without contrast. COMPARISON:  Head CT without contrast 1628 hours today. Brain MRI 09/28/2010. FINDINGS: MRI HEAD FINDINGS Brain: Small bilateral basal ganglia lacunar infarcts are chronic but have developed since 2012. There is a small 2 cm area of restricted diffusion in the cortex of the left medial left motor strip near midline. Restricted diffusion tracks into the nearby subcortical white matter. See series 3 images 38 through 41. There is also a small focus of restricted diffusion in the more anterior left cingulate gyrus on image 37. Mild associated T2 and FLAIR hyperintensity. No hemorrhage or mass effect. No other restricted diffusion. Chronic T2 and FLAIR hyperintensity in the posterior left temporal lobe is unchanged since 2012 and now favored to be post ischemic encephalomalacia. Occasional scattered subcortical white matter foci of T2 and FLAIR hyperintensity elsewhere have not significantly changed since 2012. However, there is a new but chronic appearing area of abnormal T2 and FLAIR hyperintensity in the left lateral cerebellar peduncle on series 6, image 6. No chronic cerebral blood products identified. No midline shift, mass effect, evidence of mass lesion, ventriculomegaly, extra-axial collection or acute intracranial hemorrhage. Cervicomedullary junction and pituitary are within normal limits. Vascular: Major intracranial vascular flow voids are stable since 2012 and appear normal. Skull and upper cervical spine: Negative. Normal bone marrow signal. Sinuses/Orbits: Stable and negative. Other: Visible internal auditory structures appear normal. Mastoids are clear. Negative scalp soft tissues. MRA HEAD FINDINGS Antegrade flow in the posterior circulation. Normal distal vertebral arteries. Normal basilar artery. SCA and left PCA origins are normal. Fetal  type right PCA origin. Left posterior communicating artery is diminutive. Bilateral PCA branches are normal. Antegrade flow in both ICA siphons. No siphon stenosis. Normal ophthalmic and posterior communicating artery origins. Normal carotid termini, MCA and ACA origins. The left ACA is occluded in the proximal A2 segment. The anterior communicating artery might be fenestrated where there might be a filling defect at the left A2 origin on series 4, image 105. There is brief reconstitution of the more distal A2 segment before it appears occluded once more. The right ACA appears normal. Bilateral MCA origins, M1 segments, MCA bifurcations, and visible bilateral MCA branches are within normal  limits. IMPRESSION: 1. Acute left ACA territory infarcts, primarily the distal territory at the right lower extremity representation area. No associated hemorrhage or mass effect. 2. Associated left A2 occlusion. Otherwise negative intracranial MRA. 3. Bilateral basal ganglia lacunar infarcts have occurred since 2012. Chronic signal abnormality in the posterior left temporal lobe now favored to be post ischemic encephalomalacia, and there is a new similar area of encephalomalacia in the left cerebellar peduncle. 4. Given the above, consider a cardiac or paradoxical embolic source such as patent foraminal ovale. This study was discussed by telephone with Dr. Roland Rack on 09/27/2016 at 20:34 . Electronically Signed   By: Genevie Ann M.D.   On: 09/27/2016 20:35   Mr Brain Wo Contrast  Result Date: 09/27/2016 CLINICAL DATA:  51 year old female with right side deficits, headache. Recent diplopia. EXAM: MRI HEAD WITHOUT CONTRAST MRA HEAD WITHOUT CONTRAST TECHNIQUE: Multiplanar, multiecho pulse sequences of the brain and surrounding structures were obtained without intravenous contrast. Angiographic images of the head were obtained using MRA technique without contrast. COMPARISON:  Head CT without contrast 1628 hours today.  Brain MRI 09/28/2010. FINDINGS: MRI HEAD FINDINGS Brain: Small bilateral basal ganglia lacunar infarcts are chronic but have developed since 2012. There is a small 2 cm area of restricted diffusion in the cortex of the left medial left motor strip near midline. Restricted diffusion tracks into the nearby subcortical white matter. See series 3 images 38 through 41. There is also a small focus of restricted diffusion in the more anterior left cingulate gyrus on image 37. Mild associated T2 and FLAIR hyperintensity. No hemorrhage or mass effect. No other restricted diffusion. Chronic T2 and FLAIR hyperintensity in the posterior left temporal lobe is unchanged since 2012 and now favored to be post ischemic encephalomalacia. Occasional scattered subcortical white matter foci of T2 and FLAIR hyperintensity elsewhere have not significantly changed since 2012. However, there is a new but chronic appearing area of abnormal T2 and FLAIR hyperintensity in the left lateral cerebellar peduncle on series 6, image 6. No chronic cerebral blood products identified. No midline shift, mass effect, evidence of mass lesion, ventriculomegaly, extra-axial collection or acute intracranial hemorrhage. Cervicomedullary junction and pituitary are within normal limits. Vascular: Major intracranial vascular flow voids are stable since 2012 and appear normal. Skull and upper cervical spine: Negative. Normal bone marrow signal. Sinuses/Orbits: Stable and negative. Other: Visible internal auditory structures appear normal. Mastoids are clear. Negative scalp soft tissues. MRA HEAD FINDINGS Antegrade flow in the posterior circulation. Normal distal vertebral arteries. Normal basilar artery. SCA and left PCA origins are normal. Fetal type right PCA origin. Left posterior communicating artery is diminutive. Bilateral PCA branches are normal. Antegrade flow in both ICA siphons. No siphon stenosis. Normal ophthalmic and posterior communicating artery  origins. Normal carotid termini, MCA and ACA origins. The left ACA is occluded in the proximal A2 segment. The anterior communicating artery might be fenestrated where there might be a filling defect at the left A2 origin on series 4, image 105. There is brief reconstitution of the more distal A2 segment before it appears occluded once more. The right ACA appears normal. Bilateral MCA origins, M1 segments, MCA bifurcations, and visible bilateral MCA branches are within normal limits. IMPRESSION: 1. Acute left ACA territory infarcts, primarily the distal territory at the right lower extremity representation area. No associated hemorrhage or mass effect. 2. Associated left A2 occlusion. Otherwise negative intracranial MRA. 3. Bilateral basal ganglia lacunar infarcts have occurred since 2012. Chronic signal abnormality in  the posterior left temporal lobe now favored to be post ischemic encephalomalacia, and there is a new similar area of encephalomalacia in the left cerebellar peduncle. 4. Given the above, consider a cardiac or paradoxical embolic source such as patent foraminal ovale. This study was discussed by telephone with Dr. Roland Rack on 09/27/2016 at 20:34 . Electronically Signed   By: Genevie Ann M.D.   On: 09/27/2016 20:35   Ct Head Code Stroke W/o Cm  Result Date: 09/27/2016 CLINICAL DATA:  Code stroke. 51 year old female with right side paresis, double vision. EXAM: CT HEAD WITHOUT CONTRAST TECHNIQUE: Contiguous axial images were obtained from the base of the skull through the vertex without intravenous contrast. COMPARISON:  Head and cervical spine CT 07/05/2013 and earlier. FINDINGS: Brain: No acute intracranial hemorrhage identified. No midline shift, mass effect, or evidence of intracranial mass lesion. Gray-white matter differentiation appears stable and within normal limits throughout the brain. No cortically based acute infarct identified. No ventriculomegaly. Vascular: Symmetric appearing  intracranial vascular density. No convincing abnormal intracranial vascular hyperdensity. Skull: Negative.  No acute osseous abnormality identified. Sinuses/Orbits: Visualized paranasal sinuses and mastoids are stable and well pneumatized. Other: Visualized orbits and scalp soft tissues are within normal limits. ASPECTS ([place] Stroke Program Early CT Score) - Ganglionic level infarction (caudate, lentiform nuclei, internal capsule, insula, M1-M3 cortex): 7 - Supraganglionic infarction (M4-M6 cortex): 3 Total score (0-10 with 10 being normal): 10 IMPRESSION: 1. Stable and normal noncontrast CT appearance of the brain. 2. ASPECTS is 10. 3. The above was relayed via text pager to Dr. Bruce Donath on 09/27/2016 at 16:33 . Electronically Signed   By: Genevie Ann M.D.   On: 09/27/2016 16:33    EKG: Independently reviewed.  Assessment/Plan Principal Problem:   Acute ischemic left ACA stroke (HCC) Active Problems:   Cerebral embolism with cerebral infarction   Acute bronchitis   HTN (hypertension)    1. Acute ischemic L ACA - 1. Stroke pathway and work up 2. PT/OT/SLP 3. 2d echo 4. Tele monitor 5. Carotid dopplers 6. A1C and lipid profile 7. Continue ASA/plavix 2. Acute bronchitis - 1. Will continue the Levaquin that was prescribed to her 2 days ago. 2. She requests prednisone not be continued, as it "[place] Patient me up". 3. HTN - 1. Holding home BP meds and allowing permissive HTN  DVT prophylaxis: Lovenox Code Status: Full Family Communication: Husband at bedside Disposition Plan: Home after admit Consults called: Neuro has seen patient as code stroke Admission status: Admit to inpatient   [place], [place] [facility] Pager [phone]  If 7AM-7PM, please contact day team taking care of patient [email or web address] Password [facility]  09/27/2016, 9:52 PM

## 2016-09-28 DIAGNOSIS — E876 Hypokalemia: Secondary | ICD-10-CM

## 2016-09-28 LAB — LIPID PANEL
CHOL/HDL RATIO: 3.2 ratio
Cholesterol: 145 mg/dL (ref 0–200)
HDL: 46 mg/dL (ref >40)
LDL CALC: 48 mg/dL (ref 0–99)
TRIGLYCERIDES: 256 mg/dL — AB (ref <150)
VLDL: 51 mg/dL — AB (ref 0–40)

## 2016-09-28 LAB — HIV ANTIBODY (ROUTINE TESTING W REFLEX): HIV Screen 4th Generation wRfx: NONREACTIVE

## 2016-09-28 MED ORDER — ONDANSETRON HCL 4 MG/2ML IJ SOLN
4.0000 mg | Freq: Four times a day (QID) | INTRAMUSCULAR | Status: DC | PRN
  Administered 2016-09-28: 4 mg via INTRAVENOUS
  Filled 2016-09-28 (×2): qty 2

## 2016-09-28 MED ORDER — POTASSIUM CHLORIDE CRYS ER 20 MEQ PO TBCR
40.0000 meq | EXTENDED_RELEASE_TABLET | Freq: Once | ORAL | Status: AC
  Administered 2016-09-28: 40 meq via ORAL
  Filled 2016-09-28: qty 2

## 2016-09-28 NOTE — Progress Notes (Signed)
STROKE TEAM PROGRESS NOTE   SUBJECTIVE (INTERVAL HISTORY)  Patient feels her right-sided strength is improving. MRI scan has been done in shows left ACA territory infarct with MRA showing occlusion of left anterior cerebral artery in the A2 segment.   OBJECTIVE Temp:  [97.7 F (36.5 C)-98.7 F (37.1 C)] 98.3 F (36.8 C) (06/01 0645) Pulse Rate:  [62-78] 78 (06/01 0809) Cardiac Rhythm: Normal sinus rhythm (06/01 0700) Resp:  [11-24] 16 (06/01 0809) BP: (104-152)/(58-94) 134/94 (06/01 0809) SpO2:  [91 %-97 %] 94 % (06/01 1026) Weight:  [81.2 kg (179 lb)-83.9 kg (185 lb)] 81.2 kg (179 lb) (05/31 2247)  CBC:  Recent Labs Lab 09/27/16 1611 09/27/16 1625  WBC 15.1*  --   NEUTROABS 10.1*  --   HGB 14.7 15.6*  HCT 42.8 46.0  MCV 88.8  --   PLT 302  --     Basic Metabolic Panel:  Recent Labs Lab 09/27/16 1611 09/27/16 1625  NA 137 138  K 3.3* 3.3*  CL 100* 99*  CO2 25  --   GLUCOSE 143* 140*  BUN 12 15  CREATININE 0.85 0.70  CALCIUM 9.1  --    HgbA1c: No results found for: HGBA1C    PHYSICAL EXAM Pleasant middle-aged lady currently not in distress. . Afebrile. Head is nontraumatic. Neck is supple without bruit.    Cardiac exam no murmur or gallop. Lungs are clear to auscultation. Distal pulses are well felt. Neurological Exam ;  Awake  Alert oriented x 3. Normal speech and language.eye movements full without nystagmus.fundi were not visualized. Vision acuity and fields appear normal. Hearing is normal. Palatal movements are normal. Face symmetric. Tongue midline. Normal strength, tone, reflexes and coordination. Normal sensation. Gait deferred.  ASSESSMENT/PLAN [name] is a 51 y.o. female with history of HTN, depression, fibromyalgia and stroke presenting with R sided weakness. She did not receive IV t-PA due to being out of the time window.   Stroke:   L ACA infarcts embolic secondary to unknown source  Resultant  R hemiparesis  Code Stroke CT no  acute stroke. Aspects 10.    MRI  Acute L ACA infarcts. B BG infarcts new since 21012. also new old infarcts L temporal lobe and L cerebellar peduncle  MRA  L A2 occlusion  Carotid Doppler  pending  2D Echo pending  TEE to look for embolic source. Arranged with [place] for Monday.  If positive for PFO (patent foramen ovale), check bilateral lower extremity venous dopplers to rule out DVT as possible source of stroke. (I have made patient NPO after midnight Sunday).  If TEE negative, a [facility] electrophysiologist will consult and consider placement of an implantable loop recorder to evaluate for atrial fibrillation as etiology of stroke. This has been explained to patient/family by Dr. Leonie Man and they are agreeable.   LDL 48  HgbA1c pending  Lovenox 40 mg sq daily for VTE prophylaxis  Diet Heart Room service appropriate? Yes; Fluid consistency: Thin  clopidogrel 75 mg daily prior to admission, now on clopidogrel 75 mg daily  Therapy recommendations:  pending   Disposition:  pending   Hypertension  Stable Permissive hypertension (OK if < 220/120) but gradually normalize in 5-7 days Long-term BP goal normotensive  Other Stroke Risk Factors  UDS positive for opiates and benzo  ETOH level negative   Obesity, Body mass index is 37.41 kg/m.  Hx stroke/TIA  L brain stroke with Resultant R  HP  Other Active Problems  Acute bronchitis  Hospital day # 1  I have personally examined this patient, reviewed notes, independently viewed imaging studies, participated in medical decision making and plan of care.ROS completed by me personally and pertinent positives fully documented  I have made any additions or clarifications directly to the above note.  He presented with right-sided weakness due to left anterior cerebral artery embolic infarct etiology to be determined. Recommend ongoing stroke evaluation and check  transesophageal echocardiogram and loop recorder for paroxysmal atrial fibrillation. Physical occupational therapy consults. Start Plavix for stroke prevention. Discussed with Dr. Lucien Mons ALT. Greater than 50% time during this 35 minute visit was spent on counseling and coordination of care about stroke evaluation, treatment and answered questions Antony Contras, MD Medical Director [place] Pager: [phone] 09/28/2016 4:25 PM   To contact Stroke Continuity provider, please refer to [email or web address] After hours, contact General Neurology

## 2016-09-28 NOTE — Progress Notes (Signed)
RT came to ask patient about CPAP patient is sleep and in no distress. Patient will call if she needs one.

## 2016-09-28 NOTE — Progress Notes (Signed)
Patient arrived to unit via ED stretcher with belongings and husband at bedside.  A&O x4, vitals stable, tele applied and verified.  Oriented to room/unit. Admission completed.  Continue to monitor patient.

## 2016-09-28 NOTE — Progress Notes (Signed)
Rehab Admissions Coordinator Note:  Patient was screened by Cleatrice Burke for appropriateness for an Inpatient Acute Rehab Consult per OT recommendation.  At this time, we are recommending Inpatient Rehab consult.  Cleatrice Burke 09/28/2016, 2:38 PM  I can be reached at [phone].

## 2016-09-28 NOTE — Progress Notes (Signed)
Patient stated she does not want to wear CPAP tonight. Patient states she gets awaken a lot through out the night and will not sleep anyway

## 2016-09-28 NOTE — Consult Note (Signed)
[facility] CM Primary Care Navigator  09/28/2016  Lynn Rollins 04-Nov-1965 [id number]   Met with patient, husband and parents at the bedside to identify possible discharge needs.  Patient reports having pounding headache, double vision and weakness (unable to stand up) that led to this admission.  Patient confirms that primary care provider is Dr. Nelda Bucks with [facility]. Patient states using [facility] in [place] to obtain medications without any problem.  She reports managing her medications at home straight out of the containers with husband's assistance.   Patient states that she drives prior to admission, however, husband, son or parents will be able to provide transportation to her doctor's appointments after discharge.  Husband is her primary caregiver at home and her parents (who live few houses away) are able to provide care for her if needed.   According to patient, anticipated discharge plan is [facility] ([facility]) per therapy recommendation.  Patient and husband expressed understanding to call primary care provider's office when she returns home, for a post discharge follow-up appointment within a week or sooner if needed. Patient letter (with PCP's contact number) was provided as a reminder.  Explained to patient about [facility] CM services available for health management at home. She verbally agreed to [facility] strokecalls for follow-up as she recovers.  Referral order was already in for EMMI stroke calls after discharge.  Patient was also made aware to get a referral to [facility] care management from primary care provider if still deemed necessary for further assistance in managing COPD at home.  [facility] care management information provided for future needs that she may have.   For questions, please contact:  Dannielle Huh, BSN, RN- [facility] Primary Care Navigator  Telephone: [phone] [place]

## 2016-09-28 NOTE — Progress Notes (Signed)
    [facility] has been requested to perform a transesophageal echocardiogram on 10/01/16 for Stroke.  After careful review of history and examination, the risks and benefits of transesophageal echocardiogram have been explained including risks of esophageal damage, perforation (1:10,000 risk), bleeding, pharyngeal hematoma as well as other potential complications associated with conscious sedation including aspiration, arrhythmia, respiratory failure and death. Alternatives to treatment were discussed, questions were answered. Patient is willing to proceed.   Labs and vital signs stable  Reino Bellis, NP-C 09/28/2016 3:17 PM

## 2016-09-28 NOTE — Evaluation (Signed)
Physical Therapy Evaluation Patient Details Name: Lynn Rollins MRN: [id number] DOB: January 10, 1966 Today's Date: 09/28/2016   History of Present Illness  Lynn Rollins is a 51 y.o. female with medical history significant of HTN, depression, fibromyalgia, stroke. MRI brain positive for small left ACA territory ischemic infarctions.  Clinical Impression  Pt admitted with/for R sided weakness, R LE >L LE.  MRI shows L ACA territory infarct.  Pt needs min assist for basic mobility and min assist and 2 persons for safety with gait. Marland Kitchen  Pt currently limited functionally due to the problems listed. ( See problems list.)   Pt will benefit from PT to maximize function and safety in order to get ready for next venue listed below.     Follow Up Recommendations CIR    Equipment Recommendations  Other (comment) (TBA)    Recommendations for Other Services Rehab consult     Precautions / Restrictions Precautions Precautions: Fall Restrictions Weight Bearing Restrictions: No      Mobility  Bed Mobility Overal bed mobility: Needs Assistance Bed Mobility: Supine to Sit     Supine to sit: Supervision     General bed mobility comments: Pt able to come EOB without physical assist, Pt did use bedrails and HOB elevated slightly  Transfers Overall transfer level: Needs assistance Equipment used: Rolling walker (2 wheeled) Transfers: Sit to/from [facility] Sit to Stand: Min assist Stand pivot transfers: Min assist       General transfer comment: vc for safe hand placement;   Ambulation/Gait Ambulation/Gait assistance: Min assist;+2 safety/equipment Ambulation Distance (Feet): 10 Feet Assistive device: Rolling walker (2 wheeled) Gait Pattern/deviations: Step-through pattern   Gait velocity interpretation: Below normal speed for age/gender General Gait Details: mildly unsteady with occasional R knee buckling that pt is able to recover from without lift  assist  Stairs            Wheelchair Mobility    Modified Rankin (Stroke Patients Only) Modified Rankin (Stroke Patients Only) Pre-Morbid Rankin Score: Slight disability Modified Rankin: Moderately severe disability     Balance Overall balance assessment: Needs assistance Sitting-balance support: Single extremity supported;Feet supported Sitting balance-Leahy Scale: Good Sitting balance - Comments: sitting EOB with no back support   Standing balance support: Bilateral upper extremity supported;During functional activity Standing balance-Leahy Scale: Poor Standing balance comment: reliant on RW for balance/stability                             Pertinent Vitals/Pain Pain Assessment: 0-10 Pain Score: 4  Pain Location: back - chronic pain Pain Descriptors / Indicators: Aching;Sore Pain Intervention(s): Monitored during session    Home Living Family/patient expects to be discharged to:: Private residence Living Arrangements: Spouse/significant other;Children (son is 38) Available Help at Discharge: Family;Available 24 hours/day Type of Home: House Home Access: Stairs to enter Entrance Stairs-Rails: None Entrance Stairs-Number of Steps: 4 Home Layout: One level Home Equipment: Walker - 4 wheels;Cane - single point Additional Comments: on disability    Prior Function Level of Independence: Independent with assistive device(s)         Comments: used SPC     Hand Dominance   Dominant Hand: Right    Extremity/Trunk Assessment   Upper Extremity Assessment Upper Extremity Assessment: Defer to OT evaluation    Lower Extremity Assessment Lower Extremity Assessment: RLE deficits/detail;LLE deficits/detail RLE Deficits / Details: diminished sensation decreasing pt's coordination of strength, but can bear weight without buckling;  grossly hip flex 3-, quads 3/5, hams 3-, df/pf 3/5. RLE Sensation: decreased light touch RLE Coordination: decreased fine  motor LLE Deficits / Details: WFL    Cervical / Trunk Assessment Cervical / Trunk Assessment: Other exceptions Cervical / Trunk Exceptions: history of Chronic back pain  Communication   Communication: No difficulties  Cognition Arousal/Alertness: Awake/alert Behavior During Therapy: WFL for tasks assessed/performed Overall Cognitive Status: Within Functional Limits for tasks assessed                                        General Comments      Exercises     Assessment/Plan    PT Assessment Patient needs continued PT services  PT Problem List Decreased strength;Decreased activity tolerance;Decreased balance;Decreased mobility;Decreased coordination;Decreased knowledge of use of DME       PT Treatment Interventions DME instruction;Gait training;Functional mobility training;Therapeutic activities;Balance training;Neuromuscular re-education;Patient/family education    PT Goals (Current goals can be found in the Care Plan section)  Acute Rehab PT Goals Patient Stated Goal: to get back to PLOF PT Goal Formulation: With patient Time For Goal Achievement: 10/12/16 Potential to Achieve Goals: Good    Frequency Min 3X/week   Barriers to discharge        Co-evaluation               AM-PAC PT "6 Clicks" Daily Activity  Outcome Measure Difficulty turning over in bed (including adjusting bedclothes, sheets and blankets)?: None Difficulty moving from lying on back to sitting on the side of the bed? : None Difficulty sitting down on and standing up from a chair with arms (e.g., wheelchair, bedside commode, etc,.)?: A Little Help needed moving to and from a bed to chair (including a wheelchair)?: A Little Help needed walking in hospital room?: A Little Help needed climbing 3-5 steps with a railing? : A Lot 6 Click Score: 19    End of Session   Activity Tolerance: Patient tolerated treatment well Patient left: in bed;with call bell/phone within reach;with  bed alarm set;with family/visitor present Nurse Communication: Mobility status PT Visit Diagnosis: Unsteadiness on feet (R26.81);Other abnormalities of gait and mobility (R26.89);Hemiplegia and hemiparesis Hemiplegia - Right/Left: Right Hemiplegia - dominant/non-dominant: Dominant Hemiplegia - caused by: Cerebral infarction    Time: 9678-9381 PT Time Calculation (min) (ACUTE ONLY): 36 min   Charges:   PT Evaluation $PT Eval Moderate Complexity: 1 Procedure PT Treatments $Gait Training: 8-22 mins   PT G Codes:        10-17-2016  Donnella Sham, PT [phone]  (pager)  Tessie Fass Amari Zagal 2016/10/17, 4:54 PM

## 2016-09-28 NOTE — Evaluation (Signed)
Occupational Therapy Evaluation Patient Details Name: Lynn Rollins MRN: [id number] DOB: July 05, 1965 Today's Date: 09/28/2016    History of Present Illness Lynn Rollins is a 51 y.o. female with medical history significant of HTN, depression, fibromyalgia, stroke. MRI brain positive for small left ACA territory ischemic infarctions.   Clinical Impression   PTA Pt independent in ADL and mobility with SPC. Pt currently min A for ADL and min A for transfers with RW. Please see OT problem list below. Pt will benefit from skilled OT in the acute setting to maximize safety and independence in ADL and functional transfers, and Pt will require short term stay with CIR level therapies to return to PLOF. Next session to establish HEP for RUE and begin energy conservation education.    Follow Up Recommendations  CIR;Supervision/Assistance - 24 hour    Equipment Recommendations  Other (comment) (defer to next venue)    Recommendations for Other Services Rehab consult     Precautions / Restrictions Precautions Precautions: Fall Restrictions Weight Bearing Restrictions: No      Mobility Bed Mobility Overal bed mobility: Needs Assistance Bed Mobility: Supine to Sit     Supine to sit: Supervision     General bed mobility comments: Pt able to come EOB without physical assist, Pt did use bedrails and HOB elevated slightly  Transfers Overall transfer level: Needs assistance Equipment used: Rolling walker (2 wheeled) Transfers: Sit to/from [facility] Sit to Stand: Min assist Stand pivot transfers: Min assist       General transfer comment: vc for safe hand placement, Pt complaining of wekaness in RLE during transfers    Balance Overall balance assessment: Needs assistance Sitting-balance support: Single extremity supported;Feet supported Sitting balance-Leahy Scale: Good Sitting balance - Comments: sitting EOB with no back support   Standing balance support:  Bilateral upper extremity supported;During functional activity Standing balance-Leahy Scale: Poor Standing balance comment: reliant on RW for balance                           ADL either performed or assessed with clinical judgement   ADL Overall ADL's : Needs assistance/impaired Eating/Feeding: Set up Eating/Feeding Details (indicate cue type and reason): uses LUE for feeding, increased assist needed for BUE tasks like cutting Grooming: Oral care;Wash/dry hands;Wash/dry face;Minimal assistance;Sitting;Cueing for compensatory techniques Grooming Details (indicate cue type and reason): Pt dropping items from RUE, decreased ROM- pt unable to bring RUE to face without assist from LUE Upper Body Bathing: Minimal assistance;Sitting   Lower Body Bathing: Minimal assistance;Sitting/lateral leans   Upper Body Dressing : Moderate assistance;Sitting                           Vision Baseline Vision/History: Wears glasses Wears Glasses: At all times Patient Visual Report: No change from baseline Vision Assessment?: No apparent visual deficits     Perception     Praxis      Pertinent Vitals/Pain Pain Assessment: 0-10 Pain Score: 4  Pain Location: back - chronic pain Pain Descriptors / Indicators: Aching;Sore Pain Intervention(s): Limited activity within patient's tolerance;Monitored during session;Repositioned;Premedicated before session     Hand Dominance Right   Extremity/Trunk Assessment Upper Extremity Assessment Upper Extremity Assessment: RUE deficits/detail RUE Deficits / Details: Pt grossly 3+/5 strength unable to bring hand to mouth,  RUE Sensation: decreased light touch RUE Coordination: decreased fine motor;decreased gross motor   Lower Extremity Assessment Lower Extremity Assessment:  Defer to PT evaluation   Cervical / Trunk Assessment Cervical / Trunk Assessment: Other exceptions Cervical / Trunk Exceptions: history of Chronic back pain    Communication Communication Communication: No difficulties   Cognition Arousal/Alertness: Awake/alert Behavior During Therapy: WFL for tasks assessed/performed Overall Cognitive Status: Within Functional Limits for tasks assessed                                     General Comments       Exercises     Shoulder Instructions      Home Living Family/patient expects to be discharged to:: Private residence Living Arrangements: Spouse/significant other;Children (son is 22) Available Help at Discharge: Family;Available 24 hours/day Type of Home: House Home Access: Stairs to enter [employer] of Steps: 4 Entrance Stairs-Rails: None Home Layout: One level     Bathroom Shower/Tub: Teacher, early years/pre: Standard Bathroom Accessibility: Yes How Accessible: Accessible via walker Home Equipment: [place] - 4 wheels;Cane - single point   Additional Comments: on disability      Prior Functioning/Environment Level of Independence: Independent with assistive device(s)        Comments: used SPC        OT Problem List: Decreased strength;Decreased range of motion;Decreased activity tolerance;Impaired balance (sitting and/or standing);Decreased coordination;Decreased knowledge of use of DME or AE;Impaired UE functional use;Pain      OT Treatment/Interventions: Self-care/ADL training;Therapeutic exercise;Neuromuscular education;Energy conservation;DME and/or AE instruction;Therapeutic activities;Patient/family education;Balance training    OT Goals(Current goals can be found in the care plan section) Acute Rehab OT Goals Patient Stated Goal: to get back to PLOF OT Goal Formulation: With patient Time For Goal Achievement: 10/12/16 Potential to Achieve Goals: Good ADL Goals Pt Will Perform Eating: with modified independence;with adaptive utensils;sitting Pt Will Perform Grooming: with min guard assist;standing Pt Will Perform Upper Body  Bathing: with modified independence;sitting Pt Will Perform Lower Body Bathing: with modified independence;sitting/lateral leans Pt Will Transfer to Toilet: with supervision;ambulating (BSC over toilet; with RW; caregiver independent in assisting) Pt Will Perform Toileting - Clothing Manipulation and hygiene: with modified independence;sit to/from stand Pt/caregiver will Perform Home Exercise Program: Right Upper extremity;Independently;With written HEP provided (to increase ROM and strength to increase independence in ADL)  OT Frequency: Min 3X/week   Barriers to D/C: Decreased caregiver support  family can provide 24 hour supervision if necessary       Co-evaluation              AM-PAC PT "6 Clicks" Daily Activity     Outcome Measure Help from another person eating meals?: A Little Help from another person taking care of personal grooming?: A Little Help from another person toileting, which includes using toliet, bedpan, or urinal?: A Lot Help from another person bathing (including washing, rinsing, drying)?: A Little Help from another person to put on and taking off regular upper body clothing?: A Little Help from another person to put on and taking off regular lower body clothing?: A Lot 6 Click Score: 16   End of Session Equipment Utilized During Treatment: Gait belt;Rolling walker Nurse Communication: Mobility status;Other (comment) (Pt requesting Nausea medicine)  Activity Tolerance: Patient tolerated treatment well Patient left: in chair;with call bell/phone within reach;with chair alarm set  OT Visit Diagnosis: Other abnormalities of gait and mobility (R26.89);Unsteadiness on feet (R26.81);Muscle weakness (generalized) (M62.81);Other symptoms and signs involving the nervous system (R29.898);Feeding difficulties (R63.3);Hemiplegia and hemiparesis Hemiplegia -  Right/Left: Right Hemiplegia - dominant/non-dominant: Dominant Hemiplegia - caused by: Cerebral infarction                 Time: 1638-4665 OT Time Calculation (min): 59 min Charges:  OT General Charges $OT Visit: 1 Procedure OT Evaluation $OT Eval Moderate Complexity: 1 Procedure OT Treatments $Self Care/Home Management : 23-37 mins $Therapeutic Activity: 8-22 mins G-Codes:     Hulda Humphrey OTR/L [place] 09/28/2016, 12:59 PM

## 2016-09-28 NOTE — Progress Notes (Signed)
PROGRESS NOTE   Lynn Rollins  IOE:[id number]    DOB: 1965/09/10    DOA: 09/27/2016  PCP: Nicoletta Dress, MD   I have briefly reviewed patients previous medical records in [facility]  Brief Narrative:  51 year old female with PMH of asthma/COPD despite lifelong nonsmoking, anxiety, depression, fibromyalgia, HTN, prior strokes with residual right limp and on Plavix PTA,? Borderline DM,? Prior brain contusion, presented to ED with new onset right-sided weakness and "soreness". Symptoms started around 11:30 AM on 09/27/16 and are persistent. MRI confirms acute stroke. Ongoing stroke workup. Neurology consulting.   Assessment & Plan:   Principal Problem:   Acute ischemic left ACA stroke (HCC) Active Problems:   Cerebral embolism with cerebral infarction   Acute bronchitis   HTN (hypertension)   1. Acute stroke: Small left ACA territory infarctions. CT head: Normal. MRI brain: Acute left ACA territory infarcts, associated left A2 occlusion. Old bilateral basal ganglia lacunar infarcts. MRA brain: Apart from left A2 occlusion, negative. LDL 48. A1c pending. 2-D echo and carotid Dopplers pending. Therapies evaluation pending. Neurology consultation appreciated. Continue Plavix 75 MG daily for now but may need to escalate 2-D APTT or anticoagulation pending results of workup. Stroke team to follow. 2. Recent acute bronchitis: Complete course of levofloxacin. Improved. 3. Essential hypertension: Home antihypertensives on hold to allow for permissive hypertension due to acute stroke. 4. Asthma/COPD: Stable without clinical bronchospasm. Continue Spiriva furoate/Vilanterol and when necessary bronchodilators. 5. Anxiety and depression: Continue venlafaxine. 6. Hypokalemia: Replace and follow. 7. Leukocytosis: Unclear etiology.? Stress response. Follow CBC in a.m.   DVT prophylaxis: Lovenox Code Status: Full Family Communication: None at bedside Disposition: To be determined pending  therapy evaluation.? CIR.   Consultants:  Neurology   Procedures:  None  Antimicrobials:  Levofloxacin    Subjective: Reports ongoing right-sided weakness and soreness. States that she feels better: Thinking and talking better, had difficulty with words yesterday which is improved. Reports some dizziness. After I had seen her, RN reported an episode of nonbloody emesis 1.   ROS: No chest pain, palpitations or dyspnea.  Objective:  Vitals:   09/28/16 0445 09/28/16 0645 09/28/16 0809 09/28/16 1026  BP: 130/71 (!) 110/58 (!) 134/94   Pulse: 62 64 78   Resp: 16 18 16    Temp:  98.3 F (36.8 C)    TempSrc:  Oral    SpO2: 93% 92% 95% 94%  Weight:      Height:        Examination:  General exam: Pleasant young female, moderately built and nourished, sitting up comfortably in bed. Respiratory system: Clear to auscultation. Respiratory effort normal. Cardiovascular system: S1 & S2 heard, RRR. No JVD, murmurs, rubs, gallops or clicks. No pedal edema. Telemetry: Sinus rhythm. Gastrointestinal system: Abdomen is nondistended, soft and nontender. No organomegaly or masses felt. Normal bowel sounds heard. Central nervous system: Alert and oriented. No cranial nerve deficits appreciated. Extremities: Left limbs grade 5 x 5 power. Right limbs at least grade 4 x 5 power-appears that she is not providing full effort. Right pronator drift +. Skin: No rashes, lesions or ulcers Psychiatry: Judgement and insight appear normal. Mood & affect slightly anxious.    Data Reviewed: I have personally reviewed following labs and imaging studies  CBC:  Recent Labs Lab 09/27/16 1611 09/27/16 1625  WBC 15.1*  --   NEUTROABS 10.1*  --   HGB 14.7 15.6*  HCT 42.8 46.0  MCV 88.8  --   PLT 302  --  Basic Metabolic Panel:  Recent Labs Lab 09/27/16 1611 09/27/16 1625  NA 137 138  K 3.3* 3.3*  CL 100* 99*  CO2 25  --   GLUCOSE 143* 140*  BUN 12 15  CREATININE 0.85 0.70  CALCIUM 9.1   --    Liver Function Tests:  Recent Labs Lab 09/27/16 1611  AST 28  ALT 35  ALKPHOS 86  BILITOT 0.6  PROT 6.9  ALBUMIN 3.8   Coagulation Profile:  Recent Labs Lab 09/27/16 1611  INR 0.95          Radiology Studies: Dg Chest 2 View  Result Date: 09/27/2016 CLINICAL DATA:  Acute ischemic left ACA stroke. Right-sided weakness. EXAM: CHEST  2 VIEW COMPARISON:  07/05/2013 CXR FINDINGS: The heart size and mediastinal contours are within normal limits. Both lungs are clear. The visualized skeletal structures are unremarkable. IMPRESSION: No active cardiopulmonary disease. Electronically Signed   By: Ashley Royalty M.D.   On: 09/27/2016 22:30   Mr Angiogram Head Wo Contrast  Result Date: 09/27/2016 CLINICAL DATA:  51 year old female with right side deficits, headache. Recent diplopia. EXAM: MRI HEAD WITHOUT CONTRAST MRA HEAD WITHOUT CONTRAST TECHNIQUE: Multiplanar, multiecho pulse sequences of the brain and surrounding structures were obtained without intravenous contrast. Angiographic images of the head were obtained using MRA technique without contrast. COMPARISON:  Head CT without contrast 1628 hours today. Brain MRI 09/28/2010. FINDINGS: MRI HEAD FINDINGS Brain: Small bilateral basal ganglia lacunar infarcts are chronic but have developed since 2012. There is a small 2 cm area of restricted diffusion in the cortex of the left medial left motor strip near midline. Restricted diffusion tracks into the nearby subcortical white matter. See series 3 images 38 through 41. There is also a small focus of restricted diffusion in the more anterior left cingulate gyrus on image 37. Mild associated T2 and FLAIR hyperintensity. No hemorrhage or mass effect. No other restricted diffusion. Chronic T2 and FLAIR hyperintensity in the posterior left temporal lobe is unchanged since 2012 and now favored to be post ischemic encephalomalacia. Occasional scattered subcortical white matter foci of T2 and FLAIR  hyperintensity elsewhere have not significantly changed since 2012. However, there is a new but chronic appearing area of abnormal T2 and FLAIR hyperintensity in the left lateral cerebellar peduncle on series 6, image 6. No chronic cerebral blood products identified. No midline shift, mass effect, evidence of mass lesion, ventriculomegaly, extra-axial collection or acute intracranial hemorrhage. Cervicomedullary junction and pituitary are within normal limits. Vascular: Major intracranial vascular flow voids are stable since 2012 and appear normal. Skull and upper cervical spine: Negative. Normal bone marrow signal. Sinuses/Orbits: Stable and negative. Other: Visible internal auditory structures appear normal. Mastoids are clear. Negative scalp soft tissues. MRA HEAD FINDINGS Antegrade flow in the posterior circulation. Normal distal vertebral arteries. Normal basilar artery. SCA and left PCA origins are normal. Fetal type right PCA origin. Left posterior communicating artery is diminutive. Bilateral PCA branches are normal. Antegrade flow in both ICA siphons. No siphon stenosis. Normal ophthalmic and posterior communicating artery origins. Normal carotid termini, MCA and ACA origins. The left ACA is occluded in the proximal A2 segment. The anterior communicating artery might be fenestrated where there might be a filling defect at the left A2 origin on series 4, image 105. There is brief reconstitution of the more distal A2 segment before it appears occluded once more. The right ACA appears normal. Bilateral MCA origins, M1 segments, MCA bifurcations, and visible bilateral MCA branches are within  normal limits. IMPRESSION: 1. Acute left ACA territory infarcts, primarily the distal territory at the right lower extremity representation area. No associated hemorrhage or mass effect. 2. Associated left A2 occlusion. Otherwise negative intracranial MRA. 3. Bilateral basal ganglia lacunar infarcts have occurred since  2012. Chronic signal abnormality in the posterior left temporal lobe now favored to be post ischemic encephalomalacia, and there is a new similar area of encephalomalacia in the left cerebellar peduncle. 4. Given the above, consider a cardiac or paradoxical embolic source such as patent foraminal ovale. This study was discussed by telephone with Dr. Roland Rack on 09/27/2016 at 20:34 . Electronically Signed   By: Genevie Ann M.D.   On: 09/27/2016 20:35   Mr Brain Wo Contrast  Result Date: 09/27/2016 CLINICAL DATA:  51 year old female with right side deficits, headache. Recent diplopia. EXAM: MRI HEAD WITHOUT CONTRAST MRA HEAD WITHOUT CONTRAST TECHNIQUE: Multiplanar, multiecho pulse sequences of the brain and surrounding structures were obtained without intravenous contrast. Angiographic images of the head were obtained using MRA technique without contrast. COMPARISON:  Head CT without contrast 1628 hours today. Brain MRI 09/28/2010. FINDINGS: MRI HEAD FINDINGS Brain: Small bilateral basal ganglia lacunar infarcts are chronic but have developed since 2012. There is a small 2 cm area of restricted diffusion in the cortex of the left medial left motor strip near midline. Restricted diffusion tracks into the nearby subcortical white matter. See series 3 images 38 through 41. There is also a small focus of restricted diffusion in the more anterior left cingulate gyrus on image 37. Mild associated T2 and FLAIR hyperintensity. No hemorrhage or mass effect. No other restricted diffusion. Chronic T2 and FLAIR hyperintensity in the posterior left temporal lobe is unchanged since 2012 and now favored to be post ischemic encephalomalacia. Occasional scattered subcortical white matter foci of T2 and FLAIR hyperintensity elsewhere have not significantly changed since 2012. However, there is a new but chronic appearing area of abnormal T2 and FLAIR hyperintensity in the left lateral cerebellar peduncle on series 6, image 6.  No chronic cerebral blood products identified. No midline shift, mass effect, evidence of mass lesion, ventriculomegaly, extra-axial collection or acute intracranial hemorrhage. Cervicomedullary junction and pituitary are within normal limits. Vascular: Major intracranial vascular flow voids are stable since 2012 and appear normal. Skull and upper cervical spine: Negative. Normal bone marrow signal. Sinuses/Orbits: Stable and negative. Other: Visible internal auditory structures appear normal. Mastoids are clear. Negative scalp soft tissues. MRA HEAD FINDINGS Antegrade flow in the posterior circulation. Normal distal vertebral arteries. Normal basilar artery. SCA and left PCA origins are normal. Fetal type right PCA origin. Left posterior communicating artery is diminutive. Bilateral PCA branches are normal. Antegrade flow in both ICA siphons. No siphon stenosis. Normal ophthalmic and posterior communicating artery origins. Normal carotid termini, MCA and ACA origins. The left ACA is occluded in the proximal A2 segment. The anterior communicating artery might be fenestrated where there might be a filling defect at the left A2 origin on series 4, image 105. There is brief reconstitution of the more distal A2 segment before it appears occluded once more. The right ACA appears normal. Bilateral MCA origins, M1 segments, MCA bifurcations, and visible bilateral MCA branches are within normal limits. IMPRESSION: 1. Acute left ACA territory infarcts, primarily the distal territory at the right lower extremity representation area. No associated hemorrhage or mass effect. 2. Associated left A2 occlusion. Otherwise negative intracranial MRA. 3. Bilateral basal ganglia lacunar infarcts have occurred since 2012. Chronic signal abnormality  in the posterior left temporal lobe now favored to be post ischemic encephalomalacia, and there is a new similar area of encephalomalacia in the left cerebellar peduncle. 4. Given the above,  consider a cardiac or paradoxical embolic source such as patent foraminal ovale. This study was discussed by telephone with Dr. Roland Rack on 09/27/2016 at 20:34 . Electronically Signed   By: Genevie Ann M.D.   On: 09/27/2016 20:35   Ct Head Code Stroke W/o Cm  Result Date: 09/27/2016 CLINICAL DATA:  Code stroke. 51 year old female with right side paresis, double vision. EXAM: CT HEAD WITHOUT CONTRAST TECHNIQUE: Contiguous axial images were obtained from the base of the skull through the vertex without intravenous contrast. COMPARISON:  Head and cervical spine CT 07/05/2013 and earlier. FINDINGS: Brain: No acute intracranial hemorrhage identified. No midline shift, mass effect, or evidence of intracranial mass lesion. Gray-white matter differentiation appears stable and within normal limits throughout the brain. No cortically based acute infarct identified. No ventriculomegaly. Vascular: Symmetric appearing intracranial vascular density. No convincing abnormal intracranial vascular hyperdensity. Skull: Negative.  No acute osseous abnormality identified. Sinuses/Orbits: Visualized paranasal sinuses and mastoids are stable and well pneumatized. Other: Visualized orbits and scalp soft tissues are within normal limits. ASPECTS ([place] Stroke Program Early CT Score) - Ganglionic level infarction (caudate, lentiform nuclei, internal capsule, insula, M1-M3 cortex): 7 - Supraganglionic infarction (M4-M6 cortex): 3 Total score (0-10 with 10 being normal): 10 IMPRESSION: 1. Stable and normal noncontrast CT appearance of the brain. 2. ASPECTS is 10. 3. The above was relayed via text pager to Dr. Bruce Donath on 09/27/2016 at 16:33 . Electronically Signed   By: Genevie Ann M.D.   On: 09/27/2016 16:33        Scheduled Meds: . clopidogrel  75 mg Oral Daily  . enoxaparin (LOVENOX) injection  40 mg Subcutaneous QHS  . famotidine  40 mg Oral QHS  . fluticasone furoate-vilanterol  1 puff Inhalation Daily  . levofloxacin   500 mg Oral Daily  . pantoprazole  80 mg Oral Daily  . temazepam  30 mg Oral QHS  . tiotropium  18 mcg Inhalation Daily  . venlafaxine  75 mg Oral Daily   Continuous Infusions:   LOS: 1 day     Mont Jagoda, MD, FACP, FHM. [facility] Pager [phone]  If 7PM-7AM, please contact night-coverage [email or web address] Password [email or web address] 09/28/2016, 12:32 PM

## 2016-09-29 ENCOUNTER — Inpatient Hospital Stay (HOSPITAL_COMMUNITY): Payer: Medicare HMO

## 2016-09-29 DIAGNOSIS — I63522 Cerebral infarction due to unspecified occlusion or stenosis of left anterior cerebral artery: Secondary | ICD-10-CM

## 2016-09-29 DIAGNOSIS — M797 Fibromyalgia: Secondary | ICD-10-CM

## 2016-09-29 DIAGNOSIS — I638 Other cerebral infarction: Secondary | ICD-10-CM

## 2016-09-29 LAB — ECHOCARDIOGRAM COMPLETE
EWDT: 292 ms
FS: 32 % (ref 28–44)
Height: 58 in
IVS/LV PW RATIO, ED: 1
LA diam end sys: 28 mm
LA diam index: 1.51 cm/m2
LA vol A4C: 16.7 ml
LA vol: 22.8 mL
LASIZE: 28 mm
LAVOLIN: 12.3 mL/m2
LDCA: 2.54 cm2
LV TDI E'LATERAL: 9.79
LVELAT: 9.79 cm/s
LVOT VTI: 21.7 cm
LVOT diameter: 18 mm
LVOT peak grad rest: 5 mmHg
LVOTPV: 108 cm/s
LVOTSV: 55 mL
Lateral S' vel: 9.68 cm/s
MV Dec: 292
MV pk E vel: 0.7 m/s
PW: 12 mm — AB (ref 0.6–1.1)
RV TAPSE: 16.5 mm
TDI e' medial: 8.49
WEIGHTICAEL: 2864 [oz_av]

## 2016-09-29 LAB — HEMOGLOBIN A1C
HEMOGLOBIN A1C: 5.7 % — AB (ref 4.8–5.6)
Mean Plasma Glucose: 117 mg/dL

## 2016-09-29 LAB — BASIC METABOLIC PANEL
ANION GAP: 7 (ref 5–15)
BUN: 16 mg/dL (ref 6–20)
CALCIUM: 9.5 mg/dL (ref 8.9–10.3)
CO2: 29 mmol/L (ref 22–32)
CREATININE: 0.91 mg/dL (ref 0.44–1.00)
Chloride: 102 mmol/L (ref 101–111)
Glucose, Bld: 114 mg/dL — ABNORMAL HIGH (ref 65–99)
Potassium: 4.2 mmol/L (ref 3.5–5.1)
Sodium: 138 mmol/L (ref 135–145)

## 2016-09-29 LAB — CBC
HCT: 44.8 % (ref 36.0–46.0)
Hemoglobin: 14.5 g/dL (ref 12.0–15.0)
MCH: 29.8 pg (ref 26.0–34.0)
MCHC: 32.4 g/dL (ref 30.0–36.0)
MCV: 92.2 fL (ref 78.0–100.0)
PLATELETS: 307 10*3/uL (ref 150–400)
RBC: 4.86 MIL/uL (ref 3.87–5.11)
RDW: 13.5 % (ref 11.5–15.5)
WBC: 10.6 10*3/uL — AB (ref 4.0–10.5)

## 2016-09-29 LAB — MAGNESIUM: Magnesium: 1.9 mg/dL (ref 1.7–2.4)

## 2016-09-29 NOTE — Progress Notes (Signed)
**  Preliminary report by tech**  Carotid artery duplex complete. Findings are consistent with a 1-39 percent stenosis involving the right internal carotid artery and the left internal carotid artery. The vertebral arteries demonstrate antegrade flow.  09/29/16 2:43 PM Lynn Rollins RVT

## 2016-09-29 NOTE — Progress Notes (Signed)
PROGRESS NOTE   EUTHA CUDE  OFB:[id number]    DOB: Dec 14, 1965    DOA: 09/27/2016  PCP: Nicoletta Dress, MD   I have briefly reviewed patients previous medical records in [facility].  Brief Narrative:  51 year old female with PMH of asthma/COPD despite lifelong nonsmoking, anxiety, depression, fibromyalgia, HTN, prior strokes with residual right limp and on Plavix PTA,? Borderline DM,? Prior brain contusion, presented to ED with new onset right-sided weakness and "soreness". Symptoms started around 11:30 AM on 09/27/16 and are persistent. MRI confirms acute stroke. Ongoing stroke workup. Neurology consulting.   Assessment & Plan:   Principal Problem:   Acute ischemic left ACA stroke (HCC) Active Problems:   Cerebral embolism with cerebral infarction   Acute bronchitis   HTN (hypertension)   1. Acute stroke: Small left ACA territory infarctions. CT head: Normal. MRI brain: Acute left ACA territory infarcts. Old bilateral basal ganglia lacunar infarcts. MRA brain: Apart from left A2 occlusion, negative. LDL 48. A1c 5.7. 2-D echo and carotid Dopplers pending. Therapies evaluation recommend CIR-consulted, rehabilitation M.D. input appreciated and felt appropriate candidate but to follow progress. Continue Plavix 75 MG daily. Stroke team follow-up appreciated and checking TEE (6/4) and loop recorder for PAF. Resultant right hemiparesis, improving. Etiology felt to be embolic of unknown source. 2. Recent acute bronchitis: Complete course of levofloxacin. Improved. 3. Essential hypertension: Home antihypertensives on hold to allow for permissive hypertension due to acute stroke. 4. Asthma/COPD: Stable without clinical bronchospasm. Continue Spiriva furoate/Vilanterol and when necessary bronchodilators. 5. Anxiety and depression: Continue venlafaxine. 6. Hypokalemia: Replaced. 7. Leukocytosis: Unclear etiology.? Stress response. Resolved.   DVT prophylaxis: Lovenox Code Status:  Full Family Communication: None at bedside Disposition: To be determined pending complete stroke workup. Possibly inpatient rehabilitation.   Consultants:  Neurology  Rehabilitation M.D.  Procedures:  Echo, done but report pending.  Antimicrobials:  Levofloxacin    Subjective: Seen this morning while echo was being performed. States that her right sided weakness seems to be improving. No pain reported. No further vomiting.  ROS: No chest pain, palpitations or dyspnea.  Objective:  Vitals:   09/28/16 2040 09/29/16 0034 09/29/16 0524 09/29/16 1045  BP: (!) 177/75 (!) 141/62 (!) 155/79 (!) 161/90  Pulse: 75 69 67 78  Resp: 18 18 16 18   Temp: 98.6 F (37 C) 99.1 F (37.3 C) 99.5 F (37.5 C)   TempSrc: Oral Oral Oral Oral  SpO2: 94% 94% 92% 93%  Weight:      Height:        Examination:  General exam: Pleasant young female, moderately built and nourished, Lying comfortably propped up in bed undergoing echo at bedside. Respiratory system: Clear to auscultation without increased work of breathing. Cardiovascular system: S1 and S2 heard, RRR. No JVD, murmurs or pedal edema. Telemetry: Sinus rhythm. Gastrointestinal system: Abdomen is nondistended, soft and nontender. No organomegaly or masses felt. Normal bowel sounds heard. Central nervous system: Alert and oriented. No cranial nerve deficits appreciated. Extremities: Left limbs grade 5 x 5 power. Right limbs at least grade 4 x 5 power-slightly better than yesterday. Right pronator drift +. Skin: No rashes, lesions or ulcers Psychiatry: Judgement and insight appear normal. Mood & affect slightly anxious.    Data Reviewed: I have personally reviewed following labs and imaging studies  CBC:  Recent Labs Lab 09/27/16 1611 09/27/16 1625 09/29/16 0653  WBC 15.1*  --  10.6*  NEUTROABS 10.1*  --   --   HGB 14.7 15.6* 14.5  HCT 42.8 46.0 44.8  MCV 88.8  --  92.2  PLT 302  --  903   Basic Metabolic Panel:  Recent  Labs Lab 09/27/16 1611 09/27/16 1625 09/29/16 0653  NA 137 138 138  K 3.3* 3.3* 4.2  CL 100* 99* 102  CO2 25  --  29  GLUCOSE 143* 140* 114*  BUN 12 15 16   CREATININE 0.85 0.70 0.91  CALCIUM 9.1  --  9.5  MG  --   --  1.9   Liver Function Tests:  Recent Labs Lab 09/27/16 1611  AST 28  ALT 35  ALKPHOS 86  BILITOT 0.6  PROT 6.9  ALBUMIN 3.8   Coagulation Profile:  Recent Labs Lab 09/27/16 1611  INR 0.95          Radiology Studies: Dg Chest 2 View  Result Date: 09/27/2016 CLINICAL DATA:  Acute ischemic left ACA stroke. Right-sided weakness. EXAM: CHEST  2 VIEW COMPARISON:  07/05/2013 CXR FINDINGS: The heart size and mediastinal contours are within normal limits. Both lungs are clear. The visualized skeletal structures are unremarkable. IMPRESSION: No active cardiopulmonary disease. Electronically Signed   By: Ashley Royalty M.D.   On: 09/27/2016 22:30   Mr Angiogram Head Wo Contrast  Result Date: 09/27/2016 CLINICAL DATA:  51 year old female with right side deficits, headache. Recent diplopia. EXAM: MRI HEAD WITHOUT CONTRAST MRA HEAD WITHOUT CONTRAST TECHNIQUE: Multiplanar, multiecho pulse sequences of the brain and surrounding structures were obtained without intravenous contrast. Angiographic images of the head were obtained using MRA technique without contrast. COMPARISON:  Head CT without contrast 1628 hours today. Brain MRI 09/28/2010. FINDINGS: MRI HEAD FINDINGS Brain: Small bilateral basal ganglia lacunar infarcts are chronic but have developed since 2012. There is a small 2 cm area of restricted diffusion in the cortex of the left medial left motor strip near midline. Restricted diffusion tracks into the nearby subcortical white matter. See series 3 images 38 through 41. There is also a small focus of restricted diffusion in the more anterior left cingulate gyrus on image 37. Mild associated T2 and FLAIR hyperintensity. No hemorrhage or mass effect. No other  restricted diffusion. Chronic T2 and FLAIR hyperintensity in the posterior left temporal lobe is unchanged since 2012 and now favored to be post ischemic encephalomalacia. Occasional scattered subcortical white matter foci of T2 and FLAIR hyperintensity elsewhere have not significantly changed since 2012. However, there is a new but chronic appearing area of abnormal T2 and FLAIR hyperintensity in the left lateral cerebellar peduncle on series 6, image 6. No chronic cerebral blood products identified. No midline shift, mass effect, evidence of mass lesion, ventriculomegaly, extra-axial collection or acute intracranial hemorrhage. Cervicomedullary junction and pituitary are within normal limits. Vascular: Major intracranial vascular flow voids are stable since 2012 and appear normal. Skull and upper cervical spine: Negative. Normal bone marrow signal. Sinuses/Orbits: Stable and negative. Other: Visible internal auditory structures appear normal. Mastoids are clear. Negative scalp soft tissues. MRA HEAD FINDINGS Antegrade flow in the posterior circulation. Normal distal vertebral arteries. Normal basilar artery. SCA and left PCA origins are normal. Fetal type right PCA origin. Left posterior communicating artery is diminutive. Bilateral PCA branches are normal. Antegrade flow in both ICA siphons. No siphon stenosis. Normal ophthalmic and posterior communicating artery origins. Normal carotid termini, MCA and ACA origins. The left ACA is occluded in the proximal A2 segment. The anterior communicating artery might be fenestrated where there might be a filling defect at the left A2 origin on  series 4, image 105. There is brief reconstitution of the more distal A2 segment before it appears occluded once more. The right ACA appears normal. Bilateral MCA origins, M1 segments, MCA bifurcations, and visible bilateral MCA branches are within normal limits. IMPRESSION: 1. Acute left ACA territory infarcts, primarily the distal  territory at the right lower extremity representation area. No associated hemorrhage or mass effect. 2. Associated left A2 occlusion. Otherwise negative intracranial MRA. 3. Bilateral basal ganglia lacunar infarcts have occurred since 2012. Chronic signal abnormality in the posterior left temporal lobe now favored to be post ischemic encephalomalacia, and there is a new similar area of encephalomalacia in the left cerebellar peduncle. 4. Given the above, consider a cardiac or paradoxical embolic source such as patent foraminal ovale. This study was discussed by telephone with Dr. Roland Rack on 09/27/2016 at 20:34 . Electronically Signed   By: Genevie Ann M.D.   On: 09/27/2016 20:35   Mr Brain Wo Contrast  Result Date: 09/27/2016 CLINICAL DATA:  51 year old female with right side deficits, headache. Recent diplopia. EXAM: MRI HEAD WITHOUT CONTRAST MRA HEAD WITHOUT CONTRAST TECHNIQUE: Multiplanar, multiecho pulse sequences of the brain and surrounding structures were obtained without intravenous contrast. Angiographic images of the head were obtained using MRA technique without contrast. COMPARISON:  Head CT without contrast 1628 hours today. Brain MRI 09/28/2010. FINDINGS: MRI HEAD FINDINGS Brain: Small bilateral basal ganglia lacunar infarcts are chronic but have developed since 2012. There is a small 2 cm area of restricted diffusion in the cortex of the left medial left motor strip near midline. Restricted diffusion tracks into the nearby subcortical white matter. See series 3 images 38 through 41. There is also a small focus of restricted diffusion in the more anterior left cingulate gyrus on image 37. Mild associated T2 and FLAIR hyperintensity. No hemorrhage or mass effect. No other restricted diffusion. Chronic T2 and FLAIR hyperintensity in the posterior left temporal lobe is unchanged since 2012 and now favored to be post ischemic encephalomalacia. Occasional scattered subcortical white matter foci  of T2 and FLAIR hyperintensity elsewhere have not significantly changed since 2012. However, there is a new but chronic appearing area of abnormal T2 and FLAIR hyperintensity in the left lateral cerebellar peduncle on series 6, image 6. No chronic cerebral blood products identified. No midline shift, mass effect, evidence of mass lesion, ventriculomegaly, extra-axial collection or acute intracranial hemorrhage. Cervicomedullary junction and pituitary are within normal limits. Vascular: Major intracranial vascular flow voids are stable since 2012 and appear normal. Skull and upper cervical spine: Negative. Normal bone marrow signal. Sinuses/Orbits: Stable and negative. Other: Visible internal auditory structures appear normal. Mastoids are clear. Negative scalp soft tissues. MRA HEAD FINDINGS Antegrade flow in the posterior circulation. Normal distal vertebral arteries. Normal basilar artery. SCA and left PCA origins are normal. Fetal type right PCA origin. Left posterior communicating artery is diminutive. Bilateral PCA branches are normal. Antegrade flow in both ICA siphons. No siphon stenosis. Normal ophthalmic and posterior communicating artery origins. Normal carotid termini, MCA and ACA origins. The left ACA is occluded in the proximal A2 segment. The anterior communicating artery might be fenestrated where there might be a filling defect at the left A2 origin on series 4, image 105. There is brief reconstitution of the more distal A2 segment before it appears occluded once more. The right ACA appears normal. Bilateral MCA origins, M1 segments, MCA bifurcations, and visible bilateral MCA branches are within normal limits. IMPRESSION: 1. Acute left ACA territory infarcts,  primarily the distal territory at the right lower extremity representation area. No associated hemorrhage or mass effect. 2. Associated left A2 occlusion. Otherwise negative intracranial MRA. 3. Bilateral basal ganglia lacunar infarcts have  occurred since 2012. Chronic signal abnormality in the posterior left temporal lobe now favored to be post ischemic encephalomalacia, and there is a new similar area of encephalomalacia in the left cerebellar peduncle. 4. Given the above, consider a cardiac or paradoxical embolic source such as patent foraminal ovale. This study was discussed by telephone with Dr. Roland Rack on 09/27/2016 at 20:34 . Electronically Signed   By: Genevie Ann M.D.   On: 09/27/2016 20:35   Ct Head Code Stroke W/o Cm  Result Date: 09/27/2016 CLINICAL DATA:  Code stroke. 51 year old female with right side paresis, double vision. EXAM: CT HEAD WITHOUT CONTRAST TECHNIQUE: Contiguous axial images were obtained from the base of the skull through the vertex without intravenous contrast. COMPARISON:  Head and cervical spine CT 07/05/2013 and earlier. FINDINGS: Brain: No acute intracranial hemorrhage identified. No midline shift, mass effect, or evidence of intracranial mass lesion. Gray-white matter differentiation appears stable and within normal limits throughout the brain. No cortically based acute infarct identified. No ventriculomegaly. Vascular: Symmetric appearing intracranial vascular density. No convincing abnormal intracranial vascular hyperdensity. Skull: Negative.  No acute osseous abnormality identified. Sinuses/Orbits: Visualized paranasal sinuses and mastoids are stable and well pneumatized. Other: Visualized orbits and scalp soft tissues are within normal limits. ASPECTS ([place] Stroke Program Early CT Score) - Ganglionic level infarction (caudate, lentiform nuclei, internal capsule, insula, M1-M3 cortex): 7 - Supraganglionic infarction (M4-M6 cortex): 3 Total score (0-10 with 10 being normal): 10 IMPRESSION: 1. Stable and normal noncontrast CT appearance of the brain. 2. ASPECTS is 10. 3. The above was relayed via text pager to Dr. Bruce Donath on 09/27/2016 at 16:33 . Electronically Signed   By: Genevie Ann M.D.   On:  09/27/2016 16:33        Scheduled Meds: . clopidogrel  75 mg Oral Daily  . enoxaparin (LOVENOX) injection  40 mg Subcutaneous QHS  . famotidine  40 mg Oral QHS  . fluticasone furoate-vilanterol  1 puff Inhalation Daily  . levofloxacin  500 mg Oral Daily  . pantoprazole  80 mg Oral Daily  . temazepam  30 mg Oral QHS  . tiotropium  18 mcg Inhalation Daily  . venlafaxine  75 mg Oral Daily   Continuous Infusions:   LOS: 2 days     Brodyn Depuy, MD, FACP, FHM. [facility] Pager [phone]  If 7PM-7AM, please contact night-coverage [email or web address] Password [email or web address] 09/29/2016, 1:08 PM

## 2016-09-29 NOTE — Progress Notes (Signed)
  Echocardiogram 2D Echocardiogram has been performed.  Lynn Rollins T Lynn Rollins 09/29/2016, 8:44 AM

## 2016-09-29 NOTE — Progress Notes (Signed)
Physical Medicine and Rehabilitation Consult Reason for Consult:right sided weakness Referring Physician: Algis Liming   HPI: Lynn Rollins is a 51 y.o. female with HTN, FMS who presented on 5/31 with right sided weakness. She did not receive TPA. MRI revealed left ACA infarcts and several old scattered infarcts. MRA showed occlusion of the left ACA A2 segment. Stroke work up in progress. Pt has continued to display right hemiparesis and functional deficits. PM&R was asked to evaluate patient for postacute rehab needs.    Review of Systems  Constitutional: Negative for fever.  HENT: Negative for hearing loss.   Eyes: Negative for blurred vision.  Respiratory: Negative for cough.   Cardiovascular: Negative for chest pain.  Gastrointestinal: Negative for nausea.  Genitourinary: Negative for dysuria.  Musculoskeletal: Negative for myalgias.  Skin: Negative for rash.  Neurological: Positive for speech change and focal weakness.  Psychiatric/Behavioral: Negative for depression.   Past Medical History:  Diagnosis Date  . Anxiety   . Asthma   . Chronic fatigue   . COPD (chronic obstructive pulmonary disease) ([place])   . Depression   . Fibromyalgia   . Hypertension   . Stroke [facility])    Past Surgical History:  Procedure Laterality Date  . ABDOMINAL HYSTERECTOMY    . CHOLECYSTECTOMY    . TUBAL LIGATION     No family history on file. Social History:  reports that she has never smoked. She has never used smokeless tobacco. She reports that she does not drink alcohol or use drugs. Allergies:  Allergies  Allergen Reactions  . Statins Other (See Comments)    Patient cannot recall reaction  . Tape Other (See Comments)    Patient has Fibromyalgia and cannot tolerate medical tape and it's removal   Medications Prior to Admission  Medication Sig Dispense Refill  . albuterol (PROVENTIL HFA;VENTOLIN HFA) 108 (90 BASE) MCG/ACT inhaler Inhale 2 puffs into the lungs every 4 (four)  hours as needed for wheezing.     Marland Kitchen amLODipine (NORVASC) 10 MG tablet Take 10 mg by mouth daily.    Marland Kitchen BREO ELLIPTA 200-25 MCG/INH AEPB Inhale 1 puff into the lungs daily.  3  . citalopram (CELEXA) 20 MG tablet Take 20 mg by mouth daily as needed (for mood).     . clopidogrel (PLAVIX) 75 MG tablet Take 75 mg by mouth daily.    Marland Kitchen DEXILANT 60 MG capsule Take 60 mg by mouth daily.  4  . diazepam (VALIUM) 5 MG tablet Take 5 mg by mouth 3 (three) times daily as needed for anxiety.    . dicyclomine (BENTYL) 20 MG tablet Take 20 mg by mouth every 6 (six) hours as needed for spasms.     . furosemide (LASIX) 40 MG tablet Take 40 mg by mouth 2 (two) times daily.  12  . HYDROcodone-acetaminophen (NORCO/VICODIN) 5-325 MG tablet Take 1 tablet by mouth 3 (three) times daily.    Marland Kitchen labetalol (NORMODYNE) 300 MG tablet Take 300 mg by mouth 2 (two) times daily.  3  . levofloxacin (LEVAQUIN) 500 MG tablet Take 500 mg by mouth daily. For 10 days  0  . lisinopril (PRINIVIL,ZESTRIL) 40 MG tablet Take 40 mg by mouth daily.    . mupirocin ointment (BACTROBAN) 2 % Apply 1 application topically 3 (three) times daily.    . orphenadrine (NORFLEX) 100 MG tablet Take 100 mg by mouth 2 (two) times daily as needed for muscle spasms.     . predniSONE (DELTASONE)  10 MG tablet See admin instructions. TAKE 4 TABS X3 DAYS THEN 3 TABS X3 DAYS THEN 2 TABS X3 DAYS THEN 1 TAB X3 DAYS THEN STOP  3  . promethazine (PHENERGAN) 25 MG tablet Take 25 mg by mouth every 6 (six) hours as needed for nausea.     . ranitidine (ZANTAC) 300 MG tablet Take 300 mg by mouth at bedtime.  3  . SPIRIVA RESPIMAT 1.25 MCG/ACT AERS Inhale 1 puff into the lungs 2 (two) times daily.  3  . temazepam (RESTORIL) 30 MG capsule Take 30 mg by mouth at bedtime.  5  . venlafaxine (EFFEXOR) 75 MG tablet Take 75 mg by mouth daily.  12  . Vitamin D, Ergocalciferol, (DRISDOL) 50000 units CAPS capsule Take 50,000 Units by mouth once a week.  12  . cloNIDine (CATAPRES) 0.1  MG tablet Take 0.1 mg by mouth 3 (three) times daily.      Home: Home Living Family/patient expects to be discharged to:: Private residence Living Arrangements: Spouse/significant other, Children (son is 74) Available Help at Discharge: Family, Available 24 hours/day Type of Home: House Home Access: Stairs to enter Technical brewer of Steps: 4 Entrance Stairs-Rails: None Home Layout: One level Bathroom Shower/Tub: Chiropodist: Standard Bathroom Accessibility: Yes Home Equipment: Environmental consultant - 4 wheels, Cane - single point Additional Comments: on disability  Functional History: Prior Function Level of Independence: Independent with assistive device(s) Comments: used SPC Functional Status:  Mobility: Bed Mobility Overal bed mobility: Needs Assistance Bed Mobility: Supine to Sit Supine to sit: Supervision General bed mobility comments: Pt able to come EOB without physical assist, Pt did use bedrails and HOB elevated slightly Transfers Overall transfer level: Needs assistance Equipment used: Rolling walker (2 wheeled) Transfers: Sit to/from Stand, [employer] Transfers Sit to Stand: Min assist Stand pivot transfers: Min assist General transfer comment: vc for safe hand placement;  Ambulation/Gait Ambulation/Gait assistance: Min assist, +2 safety/equipment Ambulation Distance (Feet): 10 Feet Assistive device: Rolling walker (2 wheeled) Gait Pattern/deviations: Step-through pattern General Gait Details: mildly unsteady with occasional R knee buckling that pt is able to recover from without lift assist Gait velocity interpretation: Below normal speed for age/gender    ADL: ADL Overall ADL's : Needs assistance/impaired Eating/Feeding: Set up Eating/Feeding Details (indicate cue type and reason): uses LUE for feeding, increased assist needed for BUE tasks like cutting Grooming: Oral care, Wash/dry hands, Wash/dry face, Minimal assistance, Sitting, Cueing  for compensatory techniques Grooming Details (indicate cue type and reason): Pt dropping items from RUE, decreased ROM- pt unable to bring RUE to face without assist from LUE Upper Body Bathing: Minimal assistance, Sitting Lower Body Bathing: Minimal assistance, Sitting/lateral leans Upper Body Dressing : Moderate assistance, Sitting  Cognition: Cognition Overall Cognitive Status: Within Functional Limits for tasks assessed Orientation Level: Oriented X4 Cognition Arousal/Alertness: Awake/alert Behavior During Therapy: WFL for tasks assessed/performed Overall Cognitive Status: Within Functional Limits for tasks assessed  Blood pressure (!) 155/79, pulse 67, temperature 99.5 F (37.5 C), temperature source Oral, resp. rate 16, height 4\' 10"  (1.473 m), weight 81.2 kg (179 lb), SpO2 92 %. Physical Exam  Constitutional: She appears well-developed.  HENT:  Head: Normocephalic.  Eyes: Pupils are equal, round, and reactive to light.  Cardiovascular: Normal rate.   Respiratory: Effort normal.  GI: Soft.  Musculoskeletal: Normal range of motion.  Neurological:  Occasional word finding deficits. RUE 4/5 prox to distal with mild pronator drift and decreased FTN. RLE 4 to 4+/5.  LUE and  LLE nearly 5/5. Senses pain and light touch in all 4's. Mild right facial weakness  Psychiatric: She has a normal mood and affect. Her behavior is normal.    Results for orders placed or performed during the hospital encounter of 09/27/16 (from the past 24 hour(s))  CBC     Status: Abnormal   Collection Time: 09/29/16  6:53 AM  Result Value Ref Range   WBC 10.6 (H) 4.0 - 10.5 K/uL   RBC 4.86 3.87 - 5.11 MIL/uL   Hemoglobin 14.5 12.0 - 15.0 g/dL   HCT 44.8 36.0 - 46.0 %   MCV 92.2 78.0 - 100.0 fL   MCH 29.8 26.0 - 34.0 pg   MCHC 32.4 30.0 - 36.0 g/dL   RDW 13.5 11.5 - 15.5 %   Platelets 307 150 - 400 K/uL  Basic metabolic panel     Status: Abnormal   Collection Time: 09/29/16  6:53 AM  Result Value  Ref Range   Sodium 138 135 - 145 mmol/L   Potassium 4.2 3.5 - 5.1 mmol/L   Chloride 102 101 - 111 mmol/L   CO2 29 22 - 32 mmol/L   Glucose, Bld 114 (H) 65 - 99 mg/dL   BUN 16 6 - 20 mg/dL   Creatinine, Ser 0.91 0.44 - 1.00 mg/dL   Calcium 9.5 8.9 - 10.3 mg/dL   GFR calc non Af Amer >60 >60 mL/min   GFR calc Af Amer >60 >60 mL/min   Anion gap 7 5 - 15  Magnesium     Status: None   Collection Time: 09/29/16  6:53 AM  Result Value Ref Range   Magnesium 1.9 1.7 - 2.4 mg/dL   Dg Chest 2 View  Result Date: 09/27/2016 CLINICAL DATA:  Acute ischemic left ACA stroke. Right-sided weakness. EXAM: CHEST  2 VIEW COMPARISON:  07/05/2013 CXR FINDINGS: The heart size and mediastinal contours are within normal limits. Both lungs are clear. The visualized skeletal structures are unremarkable. IMPRESSION: No active cardiopulmonary disease. Electronically Signed   By: Ashley Royalty M.D.   On: 09/27/2016 22:30   Mr Angiogram Head Wo Contrast  Result Date: 09/27/2016 CLINICAL DATA:  51 year old female with right side deficits, headache. Recent diplopia. EXAM: MRI HEAD WITHOUT CONTRAST MRA HEAD WITHOUT CONTRAST TECHNIQUE: Multiplanar, multiecho pulse sequences of the brain and surrounding structures were obtained without intravenous contrast. Angiographic images of the head were obtained using MRA technique without contrast. COMPARISON:  Head CT without contrast 1628 hours today. Brain MRI 09/28/2010. FINDINGS: MRI HEAD FINDINGS Brain: Small bilateral basal ganglia lacunar infarcts are chronic but have developed since 2012. There is a small 2 cm area of restricted diffusion in the cortex of the left medial left motor strip near midline. Restricted diffusion tracks into the nearby subcortical white matter. See series 3 images 38 through 41. There is also a small focus of restricted diffusion in the more anterior left cingulate gyrus on image 37. Mild associated T2 and FLAIR hyperintensity. No hemorrhage or mass effect.  No other restricted diffusion. Chronic T2 and FLAIR hyperintensity in the posterior left temporal lobe is unchanged since 2012 and now favored to be post ischemic encephalomalacia. Occasional scattered subcortical white matter foci of T2 and FLAIR hyperintensity elsewhere have not significantly changed since 2012. However, there is a new but chronic appearing area of abnormal T2 and FLAIR hyperintensity in the left lateral cerebellar peduncle on series 6, image 6. No chronic cerebral blood products identified. No midline shift, mass effect, evidence  of mass lesion, ventriculomegaly, extra-axial collection or acute intracranial hemorrhage. Cervicomedullary junction and pituitary are within normal limits. Vascular: Major intracranial vascular flow voids are stable since 2012 and appear normal. Skull and upper cervical spine: Negative. Normal bone marrow signal. Sinuses/Orbits: Stable and negative. Other: Visible internal auditory structures appear normal. Mastoids are clear. Negative scalp soft tissues. MRA HEAD FINDINGS Antegrade flow in the posterior circulation. Normal distal vertebral arteries. Normal basilar artery. SCA and left PCA origins are normal. Fetal type right PCA origin. Left posterior communicating artery is diminutive. Bilateral PCA branches are normal. Antegrade flow in both ICA siphons. No siphon stenosis. Normal ophthalmic and posterior communicating artery origins. Normal carotid termini, MCA and ACA origins. The left ACA is occluded in the proximal A2 segment. The anterior communicating artery might be fenestrated where there might be a filling defect at the left A2 origin on series 4, image 105. There is brief reconstitution of the more distal A2 segment before it appears occluded once more. The right ACA appears normal. Bilateral MCA origins, M1 segments, MCA bifurcations, and visible bilateral MCA branches are within normal limits. IMPRESSION: 1. Acute left ACA territory infarcts, primarily  the distal territory at the right lower extremity representation area. No associated hemorrhage or mass effect. 2. Associated left A2 occlusion. Otherwise negative intracranial MRA. 3. Bilateral basal ganglia lacunar infarcts have occurred since 2012. Chronic signal abnormality in the posterior left temporal lobe now favored to be post ischemic encephalomalacia, and there is a new similar area of encephalomalacia in the left cerebellar peduncle. 4. Given the above, consider a cardiac or paradoxical embolic source such as patent foraminal ovale. This study was discussed by telephone with Dr. Roland Rack on 09/27/2016 at 20:34 . Electronically Signed   By: Genevie Ann M.D.   On: 09/27/2016 20:35   Mr Brain Wo Contrast  Result Date: 09/27/2016 CLINICAL DATA:  51 year old female with right side deficits, headache. Recent diplopia. EXAM: MRI HEAD WITHOUT CONTRAST MRA HEAD WITHOUT CONTRAST TECHNIQUE: Multiplanar, multiecho pulse sequences of the brain and surrounding structures were obtained without intravenous contrast. Angiographic images of the head were obtained using MRA technique without contrast. COMPARISON:  Head CT without contrast 1628 hours today. Brain MRI 09/28/2010. FINDINGS: MRI HEAD FINDINGS Brain: Small bilateral basal ganglia lacunar infarcts are chronic but have developed since 2012. There is a small 2 cm area of restricted diffusion in the cortex of the left medial left motor strip near midline. Restricted diffusion tracks into the nearby subcortical white matter. See series 3 images 38 through 41. There is also a small focus of restricted diffusion in the more anterior left cingulate gyrus on image 37. Mild associated T2 and FLAIR hyperintensity. No hemorrhage or mass effect. No other restricted diffusion. Chronic T2 and FLAIR hyperintensity in the posterior left temporal lobe is unchanged since 2012 and now favored to be post ischemic encephalomalacia. Occasional scattered subcortical white  matter foci of T2 and FLAIR hyperintensity elsewhere have not significantly changed since 2012. However, there is a new but chronic appearing area of abnormal T2 and FLAIR hyperintensity in the left lateral cerebellar peduncle on series 6, image 6. No chronic cerebral blood products identified. No midline shift, mass effect, evidence of mass lesion, ventriculomegaly, extra-axial collection or acute intracranial hemorrhage. Cervicomedullary junction and pituitary are within normal limits. Vascular: Major intracranial vascular flow voids are stable since 2012 and appear normal. Skull and upper cervical spine: Negative. Normal bone marrow signal. Sinuses/Orbits: Stable and negative. Other: Visible internal  auditory structures appear normal. Mastoids are clear. Negative scalp soft tissues. MRA HEAD FINDINGS Antegrade flow in the posterior circulation. Normal distal vertebral arteries. Normal basilar artery. SCA and left PCA origins are normal. Fetal type right PCA origin. Left posterior communicating artery is diminutive. Bilateral PCA branches are normal. Antegrade flow in both ICA siphons. No siphon stenosis. Normal ophthalmic and posterior communicating artery origins. Normal carotid termini, MCA and ACA origins. The left ACA is occluded in the proximal A2 segment. The anterior communicating artery might be fenestrated where there might be a filling defect at the left A2 origin on series 4, image 105. There is brief reconstitution of the more distal A2 segment before it appears occluded once more. The right ACA appears normal. Bilateral MCA origins, M1 segments, MCA bifurcations, and visible bilateral MCA branches are within normal limits. IMPRESSION: 1. Acute left ACA territory infarcts, primarily the distal territory at the right lower extremity representation area. No associated hemorrhage or mass effect. 2. Associated left A2 occlusion. Otherwise negative intracranial MRA. 3. Bilateral basal ganglia lacunar  infarcts have occurred since 2012. Chronic signal abnormality in the posterior left temporal lobe now favored to be post ischemic encephalomalacia, and there is a new similar area of encephalomalacia in the left cerebellar peduncle. 4. Given the above, consider a cardiac or paradoxical embolic source such as patent foraminal ovale. This study was discussed by telephone with Dr. Roland Rack on 09/27/2016 at 20:34 . Electronically Signed   By: Genevie Ann M.D.   On: 09/27/2016 20:35   Ct Head Code Stroke W/o Cm  Result Date: 09/27/2016 CLINICAL DATA:  Code stroke. 51 year old female with right side paresis, double vision. EXAM: CT HEAD WITHOUT CONTRAST TECHNIQUE: Contiguous axial images were obtained from the base of the skull through the vertex without intravenous contrast. COMPARISON:  Head and cervical spine CT 07/05/2013 and earlier. FINDINGS: Brain: No acute intracranial hemorrhage identified. No midline shift, mass effect, or evidence of intracranial mass lesion. Gray-white matter differentiation appears stable and within normal limits throughout the brain. No cortically based acute infarct identified. No ventriculomegaly. Vascular: Symmetric appearing intracranial vascular density. No convincing abnormal intracranial vascular hyperdensity. Skull: Negative.  No acute osseous abnormality identified. Sinuses/Orbits: Visualized paranasal sinuses and mastoids are stable and well pneumatized. Other: Visualized orbits and scalp soft tissues are within normal limits. ASPECTS ([place] Stroke Program Early CT Score) - Ganglionic level infarction (caudate, lentiform nuclei, internal capsule, insula, M1-M3 cortex): 7 - Supraganglionic infarction (M4-M6 cortex): 3 Total score (0-10 with 10 being normal): 10 IMPRESSION: 1. Stable and normal noncontrast CT appearance of the brain. 2. ASPECTS is 10. 3. The above was relayed via text pager to Dr. Bruce Donath on 09/27/2016 at 16:33 . Electronically Signed   By: Genevie Ann M.D.    On: 09/27/2016 16:33    Assessment/Plan: Diagnosis: left ACA infarct 1. Does the need for close, 24 hr/day medical supervision in concert with the patient's rehab needs make it unreasonable for this patient to be served in a less intensive setting? Yes and potentially 2. Co-Morbidities requiring supervision/potential complications: htn, post-stroke sequelae 3. Due to bladder management, bowel management, safety, skin/wound care, disease management, medication administration, pain management and patient education, does the patient require 24 hr/day rehab nursing? Yes and Potentially 4. Does the patient require coordinated care of a physician, rehab nurse, PT (1-2 hrs/day, 5 days/week), OT (1-2 hrs/day, 5 days/week) and SLP (1-2 hrs/day, 5 days/week) to address physical and functional deficits in the context of  the above medical diagnosis(es)? Yes and potentially Addressing deficits in the following areas: balance, endurance, locomotion, strength, transferring, bowel/bladder control, bathing, dressing, feeding, grooming, toileting, cognition and language 5. Can the patient actively participate in an intensive therapy program of at least 3 hrs of therapy per day at least 5 days per week? Potentially 6. The potential for patient to make measurable gains while on inpatient rehab is good and fair 7. Anticipated functional outcomes upon discharge from inpatient rehab are modified independent  with PT, modified independent with OT, modified independent with SLP. 8. Estimated rehab length of stay to reach the above functional goals is: 7 days potentially 9. Anticipated D/C setting: Home 10. Anticipated post D/C treatments: HH therapy and Outpatient therapy 11. Overall Rehab/Functional Prognosis: excellent  RECOMMENDATIONS: This patient's condition is appropriate for continued rehabilitative care in the following setting: CIR Patient has agreed to participate in recommended program. Yes Note that  insurance prior authorization may be required for reimbursement for recommended care.  Comment: Pt appears to be making neurological gains. Will follow for progress as stroke work up proceeds. Needs to be Mod I to return home and could benefit from a brief inpatient rehab stay depending upon her progress.  Meredith Staggers, MD, [facility] 09/29/2016    Meredith Staggers, MD 09/29/2016

## 2016-09-29 NOTE — Plan of Care (Signed)
Problem: Education: Goal: Knowledge of disease or condition will improve Outcome: Progressing Early s/s of stroke education as well as risk factors discussed with patient this shift.   Problem: Safety: Goal: Ability to remain free from injury will improve Outcome: Progressing Patient ambulated to bathroom and back as well as to [facility] safely several times this shift.   Problem: Pain Managment: Goal: General experience of comfort will improve Outcome: Progressing Patient pain managed with PRN Hydrocodone this shift. Patient satisfied.

## 2016-09-29 NOTE — Progress Notes (Signed)
STROKE TEAM PROGRESS NOTE   SUBJECTIVE (INTERVAL HISTORY)  Patient feels her right-sided strength is improving.  TEE and loop recorder have been scheduled for Monday  OBJECTIVE Temp:  [98 F (36.7 C)-99.5 F (37.5 C)] 99.5 F (37.5 C) (06/02 0524) Pulse Rate:  [64-76] 67 (06/02 0524) Cardiac Rhythm: Normal sinus rhythm (06/02 0700) Resp:  [16-18] 16 (06/02 0524) BP: (138-177)/(62-85) 155/79 (06/02 0524) SpO2:  [92 %-96 %] 92 % (06/02 0524)  CBC:   Recent Labs Lab 09/27/16 1611 09/27/16 1625 09/29/16 0653  WBC 15.1*  --  10.6*  NEUTROABS 10.1*  --   --   HGB 14.7 15.6* 14.5  HCT 42.8 46.0 44.8  MCV 88.8  --  92.2  PLT 302  --  323    Basic Metabolic Panel:   Recent Labs Lab 09/27/16 1611 09/27/16 1625 09/29/16 0653  NA 137 138 138  K 3.3* 3.3* 4.2  CL 100* 99* 102  CO2 25  --  29  GLUCOSE 143* 140* 114*  BUN 12 15 16   CREATININE 0.85 0.70 0.91  CALCIUM 9.1  --  9.5  MG  --   --  1.9   HgbA1c:  Lab Results  Component Value Date   HGBA1C 5.7 (H) 09/28/2016      PHYSICAL EXAM Pleasant middle-aged lady currently not in distress. . Afebrile. Head is nontraumatic. Neck is supple without bruit.    Cardiac exam no murmur or gallop. Lungs are clear to auscultation. Distal pulses are well felt. Neurological Exam ;  Awake  Alert oriented x 3. Normal speech and language.eye movements full without nystagmus.fundi were not visualized. Vision acuity and fields appear normal. Hearing is normal. Palatal movements are normal. Face symmetric. Tongue midline. Normal strength, tone, reflexes and coordinationExcept mild right hip flexors and ankle dorsiflexor weakness.. Normal sensation. Gait deferred.  ASSESSMENT/PLAN [name] is a 51 y.o. female with history of HTN, depression, fibromyalgia and stroke presenting with R sided weakness. She did not receive IV t-PA due to being out of the time window.   Stroke:   L ACA infarcts embolic secondary to unknown  source  Resultant  R hemiparesis  Code Stroke CT no acute stroke. Aspects 10.    MRI  Acute L ACA infarcts. B BG infarcts new since 21012. also new old infarcts L temporal lobe and L cerebellar peduncle  MRA  L A2 occlusion  Carotid Doppler  pending  2D Echo pending  TEE to look for embolic source. Arranged with [facility] for Monday.  If positive for PFO (patent foramen ovale), check bilateral lower extremity venous dopplers to rule out DVT as possible source of stroke. (I have made patient NPO after midnight Sunday).  If TEE negative, a [place] electrophysiologist will consult and consider placement of an implantable loop recorder to evaluate for atrial fibrillation as etiology of stroke. This has been explained to patient/family by Dr. Leonie Man and they are agreeable.   LDL 48  HgbA1c 5.7  Lovenox 40 mg sq daily for VTE prophylaxis Diet Heart Room service appropriate? Yes; Fluid consistency: Thin Diet NPO time specified  clopidogrel 75 mg daily prior to admission, now on clopidogrel 75 mg daily  Therapy recommendations:CLR  Disposition:  pending   Hypertension  Stable Permissive hypertension (OK if < 220/120) but gradually normalize in 5-7 days Long-term BP goal normotensive  Other Stroke Risk Factors  UDS positive for opiates and benzo  ETOH level negative  Obesity, Body mass index is 37.41 kg/m.  Hx stroke/TIA  L brain stroke with Resultant R HP  Other Active Problems  Acute bronchitis  Hospital day # 2  Recommend continue Plavix and check transesophageal echocardiogram and loop recorder for paroxysmal atrial fibrillation. Physical occupational therapy consults.    Antony Contras, MD Medical Director [facility] Pager: [phone] 09/29/2016 1:00 PM  To contact Stroke Continuity provider, please refer to [email or web address] After hours, contact General Neurology

## 2016-09-29 NOTE — Evaluation (Addendum)
Speech Language Pathology Evaluation Patient Details Name: Lynn Rollins MRN: [id number] DOB: 04/20/1966 Today's Date: 09/29/2016 Time: 7078-6754 SLP Time Calculation (min) (ACUTE ONLY): 22 min  Problem List:  Patient Active Problem List   Diagnosis Date Noted  . Cerebral embolism with cerebral infarction 09/27/2016  . Acute ischemic left ACA stroke ([place]) 09/27/2016  . Acute bronchitis 09/27/2016  . HTN (hypertension) 09/27/2016   Past Medical History:  Past Medical History:  Diagnosis Date  . Anxiety   . Asthma   . Chronic fatigue   . COPD (chronic obstructive pulmonary disease) ([place])   . Depression   . Fibromyalgia   . Hypertension   . Stroke [facility])    Past Surgical History:  Past Surgical History:  Procedure Laterality Date  . ABDOMINAL HYSTERECTOMY    . CHOLECYSTECTOMY    . TUBAL LIGATION     HPI:  51 year old female with PMH of asthma/COPD despite lifelong nonsmoking, anxiety, depression, fibromyalgia, HTN, prior strokes with residual right limp and on Plavix PTA,? Borderline DM,? Prior brain contusion, presented to ED with new onset right-sided weakness and "soreness". MRI confirms acute ischemic left ACA stroke, bilateral basal ganglia lacunar infarcts have occurred since 2012.   Assessment / Plan / Recommendation Clinical Impression  Patient presents with cognitive communication impairment characterized by deficits in selective attention, delayed recall, self monitoring and reduced task persistence. She reports she has been noticing memory problems recently, which she believes may be related to strokes which were undiagnosed until this admission. She states she has been unable to play the piano at church recently, "I forgot how the music goes," states she has had difficulty with tasks such as cooking. Pt requires verbal cues to identify errors in figure copy and clock drawing. Immediate recall 3/5 with first repetition, 5/5 with second repetition. Delayed recall 2/5.  Overall score on MOCA form 7.1 was 21/30, indicating mild cognitive impairment. Recommend skilled ST in CIR to address above deficits in order to maximize cognition for safe transition to home, resuming prior household duties. No further acute needs identified, SLP will s/o.     SLP Assessment  SLP Recommendation/Assessment: All further Speech Lanaguage Pathology  needs can be addressed in the next venue of care SLP Visit Diagnosis: Cognitive communication deficit (R41.841)    Follow Up Recommendations  24 hour supervision/assistance;Inpatient Rehab    Frequency and Duration min 1 x/week  1 week      SLP Evaluation Cognition  Overall Cognitive Status: Impaired/Different from baseline Arousal/Alertness: Awake/alert Orientation Level: Oriented X4 Attention: Focused;Sustained;Selective Focused Attention: Appears intact Sustained Attention: Appears intact Selective Attention: Impaired Selective Attention Impairment: Verbal basic;Functional basic Memory: Impaired Memory Impairment: Storage deficit;Decreased recall of new information;Decreased short term memory Decreased Short Term Memory: Verbal basic;Functional basic Awareness: Impaired Awareness Impairment: Emergent impairment Problem Solving: Impaired Problem Solving Impairment: Verbal basic;Functional basic Executive Function: Self Monitoring;Self Correcting;Sequencing;Organizing;Initiating Sequencing: Impaired Sequencing Impairment: Verbal basic;Functional basic (serial subtraction 1/5) Organizing: Impaired Organizing Impairment: Verbal basic;Functional basic Initiating: Impaired (decreased task persistence) Initiating Impairment: Verbal basic;Functional basic Self Monitoring: Impaired Self Monitoring Impairment: Verbal basic;Functional basic Self Correcting: Impaired Self Correcting Impairment: Verbal basic;Functional basic (requires mod verbal cues to identify clock drawing errors) Safety/Judgment: Appears intact        Comprehension  Auditory Comprehension Overall Auditory Comprehension: Appears within functional limits for tasks assessed Visual Recognition/Discrimination Discrimination: Within Function Limits Reading Comprehension Reading Status: Not tested    Expression Expression Primary Mode of Expression: Verbal Verbal Expression Overall Verbal Expression: Impaired Level  of Generative/Spontaneous Verbalization: Conversation Repetition: Impaired Level of Impairment: Sentence level Naming: Impairment Confrontation: Within functional limits Convergent: 50-74% accurate Pragmatics: No impairment Interfering Components: Attention Written Expression Dominant Hand: Right Written Expression: Not tested   Oral / Motor  Oral Motor/Sensory Function Overall Oral Motor/Sensory Function: Within functional limits Motor Speech Overall Motor Speech: Appears within functional limits for tasks assessed Respiration: Within functional limits Phonation: Normal Resonance: Within functional limits Articulation:  (Minimal dysarthria at conversation level; >95% intelligible) Intelligibility: Intelligible Motor Planning: Witnin functional limits Motor Speech Errors: Not applicable   GO                    Aliene Altes 09/29/2016, 6:27 PM  Deneise Lever, [place], [place] Speech-Language Pathologist [phone]

## 2016-09-30 ENCOUNTER — Encounter (HOSPITAL_COMMUNITY): Payer: Self-pay

## 2016-09-30 DIAGNOSIS — I63422 Cerebral infarction due to embolism of left anterior cerebral artery: Principal | ICD-10-CM

## 2016-09-30 DIAGNOSIS — Z8673 Personal history of transient ischemic attack (TIA), and cerebral infarction without residual deficits: Secondary | ICD-10-CM

## 2016-09-30 MED ORDER — ASPIRIN EC 325 MG PO TBEC
325.0000 mg | DELAYED_RELEASE_TABLET | Freq: Every day | ORAL | Status: DC
  Administered 2016-10-01 – 2016-10-02 (×2): 325 mg via ORAL
  Filled 2016-09-30 (×2): qty 1

## 2016-09-30 MED ORDER — SODIUM CHLORIDE 0.9 % IV SOLN
INTRAVENOUS | Status: DC
  Administered 2016-10-01: 14:00:00 via INTRAVENOUS

## 2016-09-30 MED ORDER — TIOTROPIUM BROMIDE MONOHYDRATE 18 MCG IN CAPS
18.0000 ug | ORAL_CAPSULE | Freq: Every day | RESPIRATORY_TRACT | Status: DC
  Filled 2016-09-30: qty 5

## 2016-09-30 NOTE — Progress Notes (Signed)
Pt states she has a hard time wearing her CPAP at home and does not wish to wear one in the hospital. Pt encouraged to call RT if she changes her mind.

## 2016-09-30 NOTE — Progress Notes (Signed)
PROGRESS NOTE   Lynn Rollins  MAU:[id number]    DOB: 1966/04/13    DOA: 09/27/2016  PCP: Nicoletta Dress, MD   I have briefly reviewed patients previous medical [facility].  Brief Narrative:  51 year old female with PMH of asthma/COPD despite lifelong nonsmoking, anxiety, depression, fibromyalgia, HTN, prior strokes with residual right limp and on Plavix PTA,? Borderline DM,? Prior brain contusion, presented to ED with new onset right-sided weakness and "soreness". Symptoms started around 11:30 AM on 09/27/16 and are persistent. MRI confirms acute stroke. Ongoing stroke workup. Neurology consulting. Plan for TEE & Loop recorder 6/4.   Assessment & Plan:   Principal Problem:   Acute ischemic left ACA stroke (HCC) Active Problems:   Cerebral embolism with cerebral infarction   Acute bronchitis   HTN (hypertension)   1. Acute stroke: Small left ACA territory infarctions. CT head: Normal. MRI brain: Acute left ACA territory infarcts. Old bilateral basal ganglia lacunar infarcts. MRA brain: Apart from left A2 occlusion, negative. LDL 48. A1c 5.7. 2-D echo: LVEF 60-65 percent and carotid Dopplers: 1-39 percent bilateral ICA stenosis, vertebral artery shows antegrade flow. Therapies evaluation recommend CIR-consulted, rehabilitation M.D. input appreciated and felt appropriate candidate but to follow progress. Continue Plavix 75 MG daily. Stroke team follow-up appreciated and checking TEE (6/4) and loop recorder for PAF. Resultant right hemiparesis, strength continues to improve. Etiology felt to be embolic of unknown source. 2. Recent acute bronchitis: Completed 5 days of levofloxacin-discontinued. 3. Essential hypertension: Home antihypertensives on hold to allow for permissive hypertension due to acute stroke. 4. Asthma/COPD: Stable without clinical bronchospasm. Continue Spiriva furoate/Vilanterol and when necessary bronchodilators. 5. Anxiety and depression: Continue  venlafaxine. 6. Hypokalemia: Replaced. 7. Leukocytosis: Unclear etiology.? Stress response. Resolved.   DVT prophylaxis: Lovenox Code Status: Full Family Communication: None at bedside Disposition: To be determined pending complete stroke workup. Possibly inpatient rehabilitation.   Consultants:  Neurology  Rehabilitation M.D.  Procedures:  Echo 09/29/16:  Study Conclusions  - Left ventricle: The cavity size was normal. Wall thickness was   normal. Systolic function was normal. The estimated ejection   fraction was in the range of 60% to 65%. Wall motion was normal;   there were no regional wall motion abnormalities. Left   ventricular diastolic function parameters were normal.  Antimicrobials:  Levofloxacin    Subjective: States that her right-sided strength continues to improve. Has ambulated in the room with the help of a walker. No chest pain or cough reported.  ROS: No chest pain, palpitations or dyspnea.  Objective:  Vitals:   09/29/16 2100 09/30/16 0011 09/30/16 0500 09/30/16 1023  BP: (!) 159/92 (!) 146/78 (!) 150/74 (!) 156/82  Pulse: 77 71 74 71  Resp: 18 17 17 15   Temp: 98.7 F (37.1 C) 98.5 F (36.9 C) 98.6 F (37 C) 98.5 F (36.9 C)  TempSrc: Oral Oral Oral Oral  SpO2: 93% 94% 95% 96%  Weight:      Height:        Examination:  General exam: Pleasant young female, moderately built and nourished, Lying comfortably propped up in bed. Respiratory system: Clear to auscultation without increased work of breathing. Cardiovascular system: S1 and S2 heard, RRR. No JVD, murmurs or pedal edema. Telemetry personally reviewed: Sinus rhythm. Gastrointestinal system: Abdomen is nondistended, soft and nontender. No organomegaly or masses felt. Normal bowel sounds heard. Central nervous system: Alert and oriented. No cranial nerve deficits appreciated. Extremities: Left limbs grade 5 x 5 power. Right  limbs at least grade 4 x 5 power-right lower extremity seems  weaker than upper extremity but also suspect that she is not providing adequate effort. Right pronator drift +. Skin: No rashes, lesions or ulcers Psychiatry: Judgement and insight appear normal. Mood & affect: Pleasant and appropriate. Not anxious.    Data Reviewed: I have personally reviewed following labs and imaging studies  CBC:  Recent Labs Lab 09/27/16 1611 09/27/16 1625 09/29/16 0653  WBC 15.1*  --  10.6*  NEUTROABS 10.1*  --   --   HGB 14.7 15.6* 14.5  HCT 42.8 46.0 44.8  MCV 88.8  --  92.2  PLT 302  --  462   Basic Metabolic Panel:  Recent Labs Lab 09/27/16 1611 09/27/16 1625 09/29/16 0653  NA 137 138 138  K 3.3* 3.3* 4.2  CL 100* 99* 102  CO2 25  --  29  GLUCOSE 143* 140* 114*  BUN 12 15 16   CREATININE 0.85 0.70 0.91  CALCIUM 9.1  --  9.5  MG  --   --  1.9   Liver Function Tests:  Recent Labs Lab 09/27/16 1611  AST 28  ALT 35  ALKPHOS 86  BILITOT 0.6  PROT 6.9  ALBUMIN 3.8   Coagulation Profile:  Recent Labs Lab 09/27/16 1611  INR 0.95          Radiology Studies: No results found.      Scheduled Meds: . clopidogrel  75 mg Oral Daily  . enoxaparin (LOVENOX) injection  40 mg Subcutaneous QHS  . famotidine  40 mg Oral QHS  . fluticasone furoate-vilanterol  1 puff Inhalation Daily  . levofloxacin  500 mg Oral Daily  . pantoprazole  80 mg Oral Daily  . temazepam  30 mg Oral QHS  . tiotropium  18 mcg Inhalation Daily   Continuous Infusions: . [START ON 10/01/2016] sodium chloride       LOS: 3 days     Wanya Bangura, MD, FACP, FHM. [facility] Pager [phone]  If 7PM-7AM, please contact night-coverage [email or web address] Password [email or web address] 09/30/2016, 12:03 PM

## 2016-09-30 NOTE — Progress Notes (Signed)
STROKE TEAM PROGRESS NOTE   SUBJECTIVE   Patient feels her RLE strength is improving. She was able to walk with walker.  TEE and loop recorder pending tomorrow.  OBJECTIVE Temp:  [97.7 F (36.5 C)-98.6 F (37 C)] 98 F (36.7 C) (06/03 2039) Pulse Rate:  [71-82] 79 (06/03 2039) Cardiac Rhythm: Normal sinus rhythm (06/03 1900) Resp:  [15-18] 18 (06/03 2039) BP: (146-173)/(71-89) 152/71 (06/03 2039) SpO2:  [94 %-98 %] 98 % (06/03 2039)  CBC:   Recent Labs Lab 09/27/16 1611 09/27/16 1625 09/29/16 0653  WBC 15.1*  --  10.6*  NEUTROABS 10.1*  --   --   HGB 14.7 15.6* 14.5  HCT 42.8 46.0 44.8  MCV 88.8  --  92.2  PLT 302  --  245    Basic Metabolic Panel:   Recent Labs Lab 09/27/16 1611 09/27/16 1625 09/29/16 0653  NA 137 138 138  K 3.3* 3.3* 4.2  CL 100* 99* 102  CO2 25  --  29  GLUCOSE 143* 140* 114*  BUN 12 15 16   CREATININE 0.85 0.70 0.91  CALCIUM 9.1  --  9.5  MG  --   --  1.9   HgbA1c:  Lab Results  Component Value Date   HGBA1C 5.7 (H) 09/28/2016   I have personally reviewed the radiological images below and agree with the radiology interpretations.  Dg Chest 2 View  Result Date: 09/27/2016 CLINICAL DATA:  Acute ischemic left ACA stroke. Right-sided weakness. EXAM: CHEST  2 VIEW COMPARISON:  07/05/2013 CXR FINDINGS: The heart size and mediastinal contours are within normal limits. Both lungs are clear. The visualized skeletal structures are unremarkable. IMPRESSION: No active cardiopulmonary disease. Electronically Signed   By: Ashley Royalty M.D.   On: 09/27/2016 22:30   Mr Angiogram Head Wo Contrast  Result Date: 09/27/2016 CLINICAL DATA:  51 year old female with right side deficits, headache. Recent diplopia. EXAM: MRI HEAD WITHOUT CONTRAST MRA HEAD WITHOUT CONTRAST TECHNIQUE: Multiplanar, multiecho pulse sequences of the brain and surrounding structures were obtained without intravenous contrast. Angiographic images of the head were obtained using MRA  technique without contrast. COMPARISON:  Head CT without contrast 1628 hours today. Brain MRI 09/28/2010. FINDINGS: MRI HEAD FINDINGS Brain: Small bilateral basal ganglia lacunar infarcts are chronic but have developed since 2012. There is a small 2 cm area of restricted diffusion in the cortex of the left medial left motor strip near midline. Restricted diffusion tracks into the nearby subcortical white matter. See series 3 images 38 through 41. There is also a small focus of restricted diffusion in the more anterior left cingulate gyrus on image 37. Mild associated T2 and FLAIR hyperintensity. No hemorrhage or mass effect. No other restricted diffusion. Chronic T2 and FLAIR hyperintensity in the posterior left temporal lobe is unchanged since 2012 and now favored to be post ischemic encephalomalacia. Occasional scattered subcortical white matter foci of T2 and FLAIR hyperintensity elsewhere have not significantly changed since 2012. However, there is a new but chronic appearing area of abnormal T2 and FLAIR hyperintensity in the left lateral cerebellar peduncle on series 6, image 6. No chronic cerebral blood products identified. No midline shift, mass effect, evidence of mass lesion, ventriculomegaly, extra-axial collection or acute intracranial hemorrhage. Cervicomedullary junction and pituitary are within normal limits. Vascular: Major intracranial vascular flow voids are stable since 2012 and appear normal. Skull and upper cervical spine: Negative. Normal bone marrow signal. Sinuses/Orbits: Stable and negative. Other: Visible internal auditory structures appear normal. Mastoids are  clear. Negative scalp soft tissues. MRA HEAD FINDINGS Antegrade flow in the posterior circulation. Normal distal vertebral arteries. Normal basilar artery. SCA and left PCA origins are normal. Fetal type right PCA origin. Left posterior communicating artery is diminutive. Bilateral PCA branches are normal. Antegrade flow in both ICA  siphons. No siphon stenosis. Normal ophthalmic and posterior communicating artery origins. Normal carotid termini, MCA and ACA origins. The left ACA is occluded in the proximal A2 segment. The anterior communicating artery might be fenestrated where there might be a filling defect at the left A2 origin on series 4, image 105. There is brief reconstitution of the more distal A2 segment before it appears occluded once more. The right ACA appears normal. Bilateral MCA origins, M1 segments, MCA bifurcations, and visible bilateral MCA branches are within normal limits. IMPRESSION: 1. Acute left ACA territory infarcts, primarily the distal territory at the right lower extremity representation area. No associated hemorrhage or mass effect. 2. Associated left A2 occlusion. Otherwise negative intracranial MRA. 3. Bilateral basal ganglia lacunar infarcts have occurred since 2012. Chronic signal abnormality in the posterior left temporal lobe now favored to be post ischemic encephalomalacia, and there is a new similar area of encephalomalacia in the left cerebellar peduncle. 4. Given the above, consider a cardiac or paradoxical embolic source such as patent foraminal ovale. This study was discussed by telephone with Dr. Roland Rack on 09/27/2016 at 20:34 . Electronically Signed   By: Genevie Ann M.D.   On: 09/27/2016 20:35   Mr Brain Wo Contrast  Result Date: 09/27/2016 CLINICAL DATA:  51 year old female with right side deficits, headache. Recent diplopia. EXAM: MRI HEAD WITHOUT CONTRAST MRA HEAD WITHOUT CONTRAST TECHNIQUE: Multiplanar, multiecho pulse sequences of the brain and surrounding structures were obtained without intravenous contrast. Angiographic images of the head were obtained using MRA technique without contrast. COMPARISON:  Head CT without contrast 1628 hours today. Brain MRI 09/28/2010. FINDINGS: MRI HEAD FINDINGS Brain: Small bilateral basal ganglia lacunar infarcts are chronic but have developed since  2012. There is a small 2 cm area of restricted diffusion in the cortex of the left medial left motor strip near midline. Restricted diffusion tracks into the nearby subcortical white matter. See series 3 images 38 through 41. There is also a small focus of restricted diffusion in the more anterior left cingulate gyrus on image 37. Mild associated T2 and FLAIR hyperintensity. No hemorrhage or mass effect. No other restricted diffusion. Chronic T2 and FLAIR hyperintensity in the posterior left temporal lobe is unchanged since 2012 and now favored to be post ischemic encephalomalacia. Occasional scattered subcortical white matter foci of T2 and FLAIR hyperintensity elsewhere have not significantly changed since 2012. However, there is a new but chronic appearing area of abnormal T2 and FLAIR hyperintensity in the left lateral cerebellar peduncle on series 6, image 6. No chronic cerebral blood products identified. No midline shift, mass effect, evidence of mass lesion, ventriculomegaly, extra-axial collection or acute intracranial hemorrhage. Cervicomedullary junction and pituitary are within normal limits. Vascular: Major intracranial vascular flow voids are stable since 2012 and appear normal. Skull and upper cervical spine: Negative. Normal bone marrow signal. Sinuses/Orbits: Stable and negative. Other: Visible internal auditory structures appear normal. Mastoids are clear. Negative scalp soft tissues. MRA HEAD FINDINGS Antegrade flow in the posterior circulation. Normal distal vertebral arteries. Normal basilar artery. SCA and left PCA origins are normal. Fetal type right PCA origin. Left posterior communicating artery is diminutive. Bilateral PCA branches are normal. Antegrade flow in both  ICA siphons. No siphon stenosis. Normal ophthalmic and posterior communicating artery origins. Normal carotid termini, MCA and ACA origins. The left ACA is occluded in the proximal A2 segment. The anterior communicating artery  might be fenestrated where there might be a filling defect at the left A2 origin on series 4, image 105. There is brief reconstitution of the more distal A2 segment before it appears occluded once more. The right ACA appears normal. Bilateral MCA origins, M1 segments, MCA bifurcations, and visible bilateral MCA branches are within normal limits. IMPRESSION: 1. Acute left ACA territory infarcts, primarily the distal territory at the right lower extremity representation area. No associated hemorrhage or mass effect. 2. Associated left A2 occlusion. Otherwise negative intracranial MRA. 3. Bilateral basal ganglia lacunar infarcts have occurred since 2012. Chronic signal abnormality in the posterior left temporal lobe now favored to be post ischemic encephalomalacia, and there is a new similar area of encephalomalacia in the left cerebellar peduncle. 4. Given the above, consider a cardiac or paradoxical embolic source such as patent foraminal ovale. This study was discussed by telephone with Dr. Roland Rack on 09/27/2016 at 20:34 . Electronically Signed   By: Genevie Ann M.D.   On: 09/27/2016 20:35   Ct Head Code Stroke W/o Cm  Result Date: 09/27/2016 CLINICAL DATA:  Code stroke. 51 year old female with right side paresis, double vision. EXAM: CT HEAD WITHOUT CONTRAST TECHNIQUE: Contiguous axial images were obtained from the base of the skull through the vertex without intravenous contrast. COMPARISON:  Head and cervical spine CT 07/05/2013 and earlier. FINDINGS: Brain: No acute intracranial hemorrhage identified. No midline shift, mass effect, or evidence of intracranial mass lesion. Gray-white matter differentiation appears stable and within normal limits throughout the brain. No cortically based acute infarct identified. No ventriculomegaly. Vascular: Symmetric appearing intracranial vascular density. No convincing abnormal intracranial vascular hyperdensity. Skull: Negative.  No acute osseous abnormality  identified. Sinuses/Orbits: Visualized paranasal sinuses and mastoids are stable and well pneumatized. Other: Visualized orbits and scalp soft tissues are within normal limits. ASPECTS ([place] Stroke Program Early CT Score) - Ganglionic level infarction (caudate, lentiform nuclei, internal capsule, insula, M1-M3 cortex): 7 - Supraganglionic infarction (M4-M6 cortex): 3 Total score (0-10 with 10 being normal): 10 IMPRESSION: 1. Stable and normal noncontrast CT appearance of the brain. 2. ASPECTS is 10. 3. The above was relayed via text pager to Dr. Bruce Donath on 09/27/2016 at 16:33 . Electronically Signed   By: Genevie Ann M.D.   On: 09/27/2016 16:33   TTE  Left ventricle: The cavity size was normal. Wall thickness was   normal. Systolic function was normal. The estimated ejection   fraction was in the range of 60% to 65%. Wall motion was normal;   there were no regional wall motion abnormalities. Left   ventricular diastolic function parameters were normal.  CUS - Bilateral: 1-39% ICA stenosis. Vertebral artery flow is antegrade.  LE venous doppler pending  TEE pending   PHYSICAL EXAM Pleasant middle-aged lady, obese, currently not in distress. Afebrile. Head is nontraumatic. Neck is supple without bruit. Cardiac exam no murmur or gallop. Lungs are clear to auscultation. Distal pulses are well felt.  Neurological Exam ;  Awake  Alert oriented x 3. Normal speech and language.eye movements full without nystagmus.fundi were not visualized. Vision acuity and fields appear normal. Hearing is normal. Palatal movements are normal. Face symmetric. Tongue midline. Normal strength, tone, reflexes and coordination, except mild right hip flexors and ankle dorsiflexor weakness. Decreased RLE sensation. Gait  deferred.  ASSESSMENT/PLAN Ms. FAUN MCQUEEN is a 51 y.o. female with history of HTN, depression, fibromyalgia and stroke presenting with R sided weakness. She did not receive IV t-PA due to being out of  the time window.   Stroke:   L ACA infarcts embolic secondary to unknown source  Resultant  RLE paresis  Code Stroke CT no acute stroke. Aspects 10.    MRI  Acute L ACA infarcts. B BG infarcts new since 21012. Also old infarcts L temporal lobe and L cerebellar peduncle  MRA  L A2 occlusion  Carotid Doppler - unremarkable  2D Echo - EF 60-65%. No cardiac source of emboli identified.  LE venous doppler - pending  TEE and loop pending   LDL 48  HgbA1c 5.7  Hypercoagulable and autoimmune work up pending  Lovenox 40 mg sq daily for VTE prophylaxis Diet NPO time specified Diet Heart Room service appropriate? Yes; Fluid consistency: Thin  clopidogrel 75 mg daily prior to admission, now on clopidogrel 75 mg daily. Recommend DAPT with ASA and plavix for 3 months and then plavix alone due to intracranial stenosis.  Therapy recommendations: CIR   Disposition:  pending   Hypertension  Stable Permissive hypertension (OK if < 220/120) but gradually normalize in 5-7 days Long-term BP goal normotensive  Other Stroke Risk Factors  Obesity, Body mass index is 37.41 kg/m.  Hx stroke in 2011  Other Active Problems  Acute bronchitis  Depression  fibromyalgia  Hospital day # 3  Rosalin Hawking, MD PhD Stroke Neurology 09/30/2016 10:53 PM     To contact Stroke Continuity provider, please refer to [email or web address] After hours, contact General Neurology

## 2016-10-01 ENCOUNTER — Inpatient Hospital Stay (HOSPITAL_COMMUNITY): Payer: Medicare HMO

## 2016-10-01 ENCOUNTER — Encounter (HOSPITAL_COMMUNITY)
Admission: EM | Disposition: A | Discharge status: Home Health | Payer: Self-pay | Source: Home / Self Care | Attending: Internal Medicine

## 2016-10-01 ENCOUNTER — Encounter (HOSPITAL_COMMUNITY): Payer: Self-pay

## 2016-10-01 DIAGNOSIS — I639 Cerebral infarction, unspecified: Secondary | ICD-10-CM

## 2016-10-01 DIAGNOSIS — I638 Other cerebral infarction: Secondary | ICD-10-CM

## 2016-10-01 DIAGNOSIS — I63522 Cerebral infarction due to unspecified occlusion or stenosis of left anterior cerebral artery: Secondary | ICD-10-CM

## 2016-10-01 HISTORY — PX: LOOP RECORDER INSERTION: EP1214

## 2016-10-01 HISTORY — PX: TEE WITHOUT CARDIOVERSION: SHX5443

## 2016-10-01 LAB — BASIC METABOLIC PANEL
Anion gap: 7 (ref 5–15)
BUN: 15 mg/dL (ref 6–20)
CHLORIDE: 105 mmol/L (ref 101–111)
CO2: 25 mmol/L (ref 22–32)
Calcium: 9.4 mg/dL (ref 8.9–10.3)
Creatinine, Ser: 0.72 mg/dL (ref 0.44–1.00)
GFR calc Af Amer: >60 mL/min (ref >60)
GFR calc non Af Amer: >60 mL/min (ref >60)
Glucose, Bld: 113 mg/dL — ABNORMAL HIGH (ref 65–99)
Potassium: 4 mmol/L (ref 3.5–5.1)
SODIUM: 137 mmol/L (ref 135–145)

## 2016-10-01 LAB — SEDIMENTATION RATE: SED RATE: 21 mm/h (ref 0–22)

## 2016-10-01 LAB — C-REACTIVE PROTEIN: CRP: <0.8 mg/dL (ref <1.0)

## 2016-10-01 SURGERY — LOOP RECORDER INSERTION

## 2016-10-01 SURGERY — ECHOCARDIOGRAM, TRANSESOPHAGEAL
Anesthesia: Moderate Sedation

## 2016-10-01 MED ORDER — FENTANYL CITRATE (PF) 100 MCG/2ML IJ SOLN
INTRAMUSCULAR | Status: AC
  Filled 2016-10-01: qty 2

## 2016-10-01 MED ORDER — FLUTICASONE FUROATE-VILANTEROL 200-25 MCG/INH IN AEPB
1.0000 | INHALATION_SPRAY | Freq: Every day | RESPIRATORY_TRACT | Status: DC
  Administered 2016-10-02: 08:00:00 1 via RESPIRATORY_TRACT
  Filled 2016-10-01: qty 28

## 2016-10-01 MED ORDER — HYDROCODONE-ACETAMINOPHEN 7.5-325 MG/15ML PO SOLN
ORAL | Status: AC
  Filled 2016-10-01: qty 15

## 2016-10-01 MED ORDER — LIDOCAINE-EPINEPHRINE 1 %-1:100000 IJ SOLN
INTRAMUSCULAR | Status: AC
  Filled 2016-10-01: qty 1

## 2016-10-01 MED ORDER — TIOTROPIUM BROMIDE MONOHYDRATE 18 MCG IN CAPS
18.0000 ug | ORAL_CAPSULE | Freq: Every day | RESPIRATORY_TRACT | Status: DC
  Administered 2016-10-02: 18 ug via RESPIRATORY_TRACT
  Filled 2016-10-01: qty 5

## 2016-10-01 MED ORDER — FENTANYL CITRATE (PF) 100 MCG/2ML IJ SOLN
INTRAMUSCULAR | Status: DC | PRN
  Administered 2016-10-01 (×3): 25 ug via INTRAVENOUS

## 2016-10-01 MED ORDER — BUTAMBEN-TETRACAINE-BENZOCAINE 2-2-14 % EX AERO
INHALATION_SPRAY | CUTANEOUS | Status: DC | PRN
  Administered 2016-10-01: 2 via TOPICAL

## 2016-10-01 MED ORDER — MIDAZOLAM HCL 10 MG/2ML IJ SOLN
INTRAMUSCULAR | Status: DC | PRN
  Administered 2016-10-01 (×3): 2 mg via INTRAVENOUS
  Administered 2016-10-01: 1 mg via INTRAVENOUS

## 2016-10-01 MED ORDER — LIDOCAINE-EPINEPHRINE 1 %-1:100000 IJ SOLN
INTRAMUSCULAR | Status: DC | PRN
  Administered 2016-10-01: 20 mL

## 2016-10-01 MED ORDER — MIDAZOLAM HCL 5 MG/ML IJ SOLN
INTRAMUSCULAR | Status: AC
  Filled 2016-10-01: qty 2

## 2016-10-01 MED ORDER — HYDROCODONE-ACETAMINOPHEN 5-325 MG PO TABS
1.0000 | ORAL_TABLET | Freq: Three times a day (TID) | ORAL | Status: DC
  Administered 2016-10-01 – 2016-10-02 (×4): 1 via ORAL
  Filled 2016-10-01 (×4): qty 1

## 2016-10-01 SURGICAL SUPPLY — 2 items
LOOP REVEAL LINQSYS (Prosthesis & Implant Heart) ×3 IMPLANT
PACK LOOP INSERTION (CUSTOM PROCEDURE TRAY) ×3 IMPLANT

## 2016-10-01 NOTE — Progress Notes (Signed)
PROGRESS NOTE   Lynn Rollins  JOI:[id number]    DOB: [birth year]    DOA: 09/27/2016  PCP: Nicoletta Dress, MD   I have briefly reviewed patients previous medical records [facility].  Brief Narrative:  51 year old female with PMH of asthma/COPD despite lifelong nonsmoking, anxiety, depression, fibromyalgia, HTN, prior strokes with residual right limp and on Plavix PTA,? Borderline DM,? Prior brain contusion, presented to ED with new onset right-sided weakness and "soreness". Symptoms started around 11:30 AM on 09/27/16 and are persistent. MRI confirms acute stroke. Ongoing stroke workup. Neurology consulting. Plan for TEE & Loop recorder 6/4.After stroke workup is complete, likely DC to CIR.   Assessment & Plan:   Principal Problem:   Acute ischemic left ACA stroke (HCC) Active Problems:   Cerebral embolism with cerebral infarction   Acute bronchitis   HTN (hypertension)   History of stroke   1. Acute stroke: Small left ACA territory infarctions. CT head: Normal. MRI brain: Acute left ACA territory infarcts. Old bilateral basal ganglia lacunar infarcts. MRA brain: Apart from left A2 occlusion, negative. LDL 48. A1c 5.7. 2-D echo: LVEF 60-65 percent and carotid Dopplers: 1-39 percent bilateral ICA stenosis, vertebral artery shows antegrade flow. Therapies evaluation recommend CIR-consulted, rehabilitation M.D. input appreciated and felt appropriate candidate but to follow progress. Stroke team follow-up appreciated and checking TEE and loop recorder for PAF - Cardiology plans 6/4, lower extremity venous Dopplers pending, hypercoagulable panel and autoimmune workup pending and recommend DAPT with aspirin & Plavix for 3 months and then Plavix alone due to intracranial stenosis. Resultant right hemiparesis, strength continues to improve. Etiology felt to be embolic of unknown source. 2. Recent acute bronchitis: Completed 5 days of levofloxacin-discontinued. No steroids  indicated. 3. Essential hypertension: Home antihypertensives on hold to allow for permissive hypertension due to acute stroke. May consider resuming one of her multiple antihypertensives and Lasix at discharge. 4. Asthma/COPD: Stable without clinical bronchospasm. Continue Spiriva furoate/Vilanterol and when necessary bronchodilators. 5. Anxiety and depression: Continue venlafaxine. 6. Hypokalemia: Replaced. 7. Leukocytosis: Unclear etiology.? Stress response. Resolved. 8. Fibromyalgia/chronic pain: States that she takes Vicodin 3/25, 1 tab 3 times a day, changed to same due to ongoing pain issues.   DVT prophylaxis: Lovenox Code Status: Full Family Communication: None at bedside Disposition: DC possibly to CIR on 10/02/16, pending completion of stroke workup.   Consultants:  Neurology  Rehabilitation M.D.  Procedures:  Echo 09/29/16:  Study Conclusions  - Left ventricle: The cavity size was normal. Wall thickness was   normal. Systolic function was normal. The estimated ejection   fraction was in the range of 60% to 65%. Wall motion was normal;   there were no regional wall motion abnormalities. Left   ventricular diastolic function parameters were normal.  Antimicrobials:  Levofloxacin    Subjective: Seen this morning prior to procedures. Complains of ongoing chronic pain due to her fibromyalgia and concerned that she is not getting her Vicodin like she takes it at home. Right-sided strength continues to improve. No new complaints reported.  ROS: No chest pain, palpitations or dyspnea.  Objective:  Vitals:   10/01/16 0043 10/01/16 0440 10/01/16 0733 10/01/16 1100  BP: (!) 144/69 (!) 155/81  (!) 155/89  Pulse: 77 73  70  Resp: 18 20  18   Temp: 98.2 F (36.8 C) 98.5 F (36.9 C)  98.2 F (36.8 C)  TempSrc: Oral Oral  Oral  SpO2: 98% 98% 96% 95%  Weight:      Height:  Examination:  General exam: Pleasant young female, moderately built and nourished, Lying  comfortably propped up in bed. Respiratory system: Clear to auscultation without increased work of breathing. Cardiovascular system: S1 and S2 heard, RRR. No JVD, murmurs or pedal edema. Telemetry personally reviewed: Sinus rhythm and no arrhythmias noted . Gastrointestinal system: Abdomen is nondistended, soft and nontender. No organomegaly or masses felt. Normal bowel sounds heard. Central nervous system: Alert and oriented. No cranial nerve deficits appreciated. Extremities: Left limbs grade 5 x 5 power. Right limbs at least grade 4 x 5 power-right lower extremity seems weaker than upper extremity but also suspect that she is not providing adequate effort- seems to be ongoing issue. Right pronator drift +. Skin: No rashes, lesions or ulcers Psychiatry: Judgement and insight appear normal. Mood & affect: Pleasant and appropriate. Not anxious.    Data Reviewed: I have personally reviewed following labs and imaging studies  CBC:  Recent Labs Lab 09/27/16 1611 09/27/16 1625 09/29/16 0653  WBC 15.1*  --  10.6*  NEUTROABS 10.1*  --   --   HGB 14.7 15.6* 14.5  HCT 42.8 46.0 44.8  MCV 88.8  --  92.2  PLT 302  --  528   Basic Metabolic Panel:  Recent Labs Lab 09/27/16 1611 09/27/16 1625 09/29/16 0653 10/01/16 0532  NA 137 138 138 137  K 3.3* 3.3* 4.2 4.0  CL 100* 99* 102 105  CO2 25  --  29 25  GLUCOSE 143* 140* 114* 113*  BUN 12 15 16 15   CREATININE 0.85 0.70 0.91 0.72  CALCIUM 9.1  --  9.5 9.4  MG  --   --  1.9  --    Liver Function Tests:  Recent Labs Lab 09/27/16 1611  AST 28  ALT 35  ALKPHOS 86  BILITOT 0.6  PROT 6.9  ALBUMIN 3.8   Coagulation Profile:  Recent Labs Lab 09/27/16 1611  INR 0.95          Radiology Studies: No results found.      Scheduled Meds: . aspirin EC  325 mg Oral Daily  . clopidogrel  75 mg Oral Daily  . enoxaparin (LOVENOX) injection  40 mg Subcutaneous QHS  . famotidine  40 mg Oral QHS  . [START ON 10/02/2016]  fluticasone furoate-vilanterol  1 puff Inhalation Daily  . HYDROcodone-acetaminophen  1 tablet Oral TID  . pantoprazole  80 mg Oral Daily  . temazepam  30 mg Oral QHS  . tiotropium  18 mcg Inhalation QHS   Continuous Infusions: . sodium chloride       LOS: 4 days     Mechele Kittleson, MD, FACP, FHM. [facility] Pager [phone]  If 7PM-7AM, please contact night-coverage [email or web address] Password [email or web address] 10/01/2016, 12:51 PM

## 2016-10-01 NOTE — Consult Note (Signed)
ELECTROPHYSIOLOGY CONSULT NOTE  Patient ID: ISRAELLA HUBERT MRN: [id number], DOB/AGE: [birth year]   Admit date: 09/27/2016 Date of Consult: 10/01/2016  Primary Physician: Nicoletta Dress, MD Primary Cardiologist: new to [facility] Reason for Consultation: Cryptogenic stroke; recommendations regarding Implantable Loop Recorder  History of Present Illness KEYLA MILONE is a 51 y.o. female EP has been asked to see by Dr Erlinda Hong to evaluate for ILR. Imaging demonstrated L ACA infarcts felt to be embolic 2/2 unknown source.  She has undergone workup for stroke including echocardiogram and carotid imaging.  The patient has been monitored on telemetry which has demonstrated sinus rhythm with no arrhythmias.  Inpatient stroke work-up is to be completed with a TEE.   Echocardiogram this admission demonstrated EF 60-65%, no RWMA, LA 28.  Lab work is reviewed.  Prior to admission, the patient denies chest pain, shortness of breath, dizziness, palpitations, or syncope.  They are recovering from their stroke with plans to possibly participate in CIR at discharge.  EP has been asked to evaluate for placement of an implantable loop recorder to monitor for atrial fibrillation.   Past Medical History:  Diagnosis Date  . Anxiety   . Asthma   . Chronic fatigue   . COPD (chronic obstructive pulmonary disease) ([place])   . Depression   . Fibromyalgia   . Hypertension   . Stroke [facility])      Surgical History:  Past Surgical History:  Procedure Laterality Date  . ABDOMINAL HYSTERECTOMY    . CHOLECYSTECTOMY    . TUBAL LIGATION       Prescriptions Prior to Admission  Medication Sig Dispense Refill Last Dose  . albuterol (PROVENTIL HFA;VENTOLIN HFA) 108 (90 BASE) MCG/ACT inhaler Inhale 2 puffs into the lungs every 4 (four) hours as needed for wheezing.    09/26/2016 at pm  . amLODipine (NORVASC) 10 MG tablet Take 10 mg by mouth daily.   09/27/2016 at am  . BREO ELLIPTA 200-25 MCG/INH AEPB Inhale 1 puff  into the lungs daily.  3 09/26/2016 at Unknown time  . citalopram (CELEXA) 20 MG tablet Take 20 mg by mouth daily as needed (for mood).    PRN at PRN  . clopidogrel (PLAVIX) 75 MG tablet Take 75 mg by mouth daily.   09/26/2016 at 2000  . DEXILANT 60 MG capsule Take 60 mg by mouth daily.  4 09/27/2016 at am  . diazepam (VALIUM) 5 MG tablet Take 5 mg by mouth 3 (three) times daily as needed for anxiety.   PRN at PRN  . dicyclomine (BENTYL) 20 MG tablet Take 20 mg by mouth every 6 (six) hours as needed for spasms.    PRN at PRN  . furosemide (LASIX) 40 MG tablet Take 40 mg by mouth 2 (two) times daily.  12 09/27/2016 at am  . HYDROcodone-acetaminophen (NORCO/VICODIN) 5-325 MG tablet Take 1 tablet by mouth 3 (three) times daily.   09/27/2016 at 0600  . labetalol (NORMODYNE) 300 MG tablet Take 300 mg by mouth 2 (two) times daily.  3 09/27/2016 at am  . levofloxacin (LEVAQUIN) 500 MG tablet Take 500 mg by mouth daily. For 10 days  0 09/27/2016 at am  . lisinopril (PRINIVIL,ZESTRIL) 40 MG tablet Take 40 mg by mouth daily.   09/26/2016 at pm  . mupirocin ointment (BACTROBAN) 2 % Apply 1 application topically 3 (three) times daily.   Past Week at Unknown time  . orphenadrine (NORFLEX) 100 MG tablet Take 100 mg by mouth 2 (  two) times daily as needed for muscle spasms.    PRN at PRN  . predniSONE (DELTASONE) 10 MG tablet See admin instructions. TAKE 4 TABS X3 DAYS THEN 3 TABS X3 DAYS THEN 2 TABS X3 DAYS THEN 1 TAB X3 DAYS THEN STOP  3 09/26/2016 at pm  . promethazine (PHENERGAN) 25 MG tablet Take 25 mg by mouth every 6 (six) hours as needed for nausea.    PRN at PRN  . ranitidine (ZANTAC) 300 MG tablet Take 300 mg by mouth at bedtime.  3 09/26/2016 at pm  . SPIRIVA RESPIMAT 1.25 MCG/ACT AERS Inhale 1 puff into the lungs 2 (two) times daily.  3 09/26/2016 at pm  . temazepam (RESTORIL) 30 MG capsule Take 30 mg by mouth at bedtime.  5 09/26/2016 at pm  . venlafaxine (EFFEXOR) 75 MG tablet Take 75 mg by mouth daily.  12  09/26/2016 at am  . Vitamin D, Ergocalciferol, (DRISDOL) 50000 units CAPS capsule Take 50,000 Units by mouth once a week.  12 09/22/2016 at am  . cloNIDine (CATAPRES) 0.1 MG tablet Take 0.1 mg by mouth 3 (three) times daily.   Unk at [facility]    Inpatient Medications:  . aspirin EC  325 mg Oral Daily  . clopidogrel  75 mg Oral Daily  . enoxaparin (LOVENOX) injection  40 mg Subcutaneous QHS  . famotidine  40 mg Oral QHS  . fluticasone furoate-vilanterol  1 puff Inhalation Daily  . HYDROcodone-acetaminophen  1 tablet Oral TID  . pantoprazole  80 mg Oral Daily  . temazepam  30 mg Oral QHS  . tiotropium  18 mcg Inhalation QHS    Allergies:  Allergies  Allergen Reactions  . Statins Other (See Comments)    Patient cannot recall reaction  . Tape Other (See Comments)    Patient has Fibromyalgia and cannot tolerate medical tape and it's removal    Social History   Social History  . Marital status: Married    Spouse name: N/A  . Number of children: N/A  . Years of education: N/A   Occupational History  . Not on file.   Social History Main Topics  . Smoking status: Never Smoker  . Smokeless tobacco: Never Used  . Alcohol use No  . Drug use: No  . Sexual activity: Not on file   Other Topics Concern  . Not on file   Social History Narrative  . No narrative on file     Family History: mom - stroke, HTN;  dad - CAD, parkinson's, HTN; brother - MS  Review of Systems: All other systems reviewed and are otherwise negative except as noted above.  Physical Exam: Vitals:   09/30/16 2039 10/01/16 0043 10/01/16 0440 10/01/16 0733  BP: (!) 152/71 (!) 144/69 (!) 155/81   Pulse: 79 77 73   Resp: 18 18 20    Temp: 98 F (36.7 C) 98.2 F (36.8 C) 98.5 F (36.9 C)   TempSrc: Oral Oral Oral   SpO2: 98% 98% 98% 96%  Weight:      Height:        GEN- The patient is well appearing, alert and oriented x 3 today.   Head- normocephalic, atraumatic Eyes-  Sclera clear, conjunctiva  pink Ears- hearing intact Oropharynx- clear Neck- supple Lungs- Clear to ausculation bilaterally, normal work of breathing Heart- Regular rate and rhythm, no murmurs, rubs or gallops  GI- soft, NT, ND, + BS Extremities- no clubbing, cyanosis, or edema MS- no significant deformity or  atrophy Skin- no rash or lesion Psych- euthymic mood, full affect   Labs:   Lab Results  Component Value Date   WBC 10.6 (H) 09/29/2016   HGB 14.5 09/29/2016   HCT 44.8 09/29/2016   MCV 92.2 09/29/2016   PLT 307 09/29/2016    Recent Labs Lab 09/27/16 1611  10/01/16 0532  NA 137  < > 137  K 3.3*  < > 4.0  CL 100*  < > 105  CO2 25  < > 25  BUN 12  < > 15  CREATININE 0.85  < > 0.72  CALCIUM 9.1  < > 9.4  PROT 6.9  --   --   BILITOT 0.6  --   --   ALKPHOS 86  --   --   ALT 35  --   --   AST 28  --   --   GLUCOSE 143*  < > 113*  < > = values in this interval not displayed. No results found for: CKTOTAL, CKMB, CKMBINDEX, TROPONINI   Radiology/Studies: Dg Chest 2 View Result Date: 09/27/2016 CLINICAL DATA:  Acute ischemic left ACA stroke. Right-sided weakness. EXAM: CHEST  2 VIEW COMPARISON:  07/05/2013 CXR FINDINGS: The heart size and mediastinal contours are within normal limits. Both lungs are clear. The visualized skeletal structures are unremarkable. IMPRESSION: No active cardiopulmonary disease. Electronically Signed   By: Ashley Royalty M.D.   On: 09/27/2016 22:30   Mr Angiogram Head Wo Contrast Result Date: 09/27/2016 CLINICAL DATA:  51 year old female with right side deficits, headache. Recent diplopia. EXAM: MRI HEAD WITHOUT CONTRAST MRA HEAD WITHOUT CONTRAST TECHNIQUE: Multiplanar, multiecho pulse sequences of the brain and surrounding structures were obtained without intravenous contrast. Angiographic images of the head were obtained using MRA technique without contrast. COMPARISON:  Head CT without contrast 1628 hours today. Brain MRI 09/28/2010. FINDINGS: MRI HEAD FINDINGS Brain: Small  bilateral basal ganglia lacunar infarcts are chronic but have developed since 2012. There is a small 2 cm area of restricted diffusion in the cortex of the left medial left motor strip near midline. Restricted diffusion tracks into the nearby subcortical white matter. See series 3 images 38 through 41. There is also a small focus of restricted diffusion in the more anterior left cingulate gyrus on image 37. Mild associated T2 and FLAIR hyperintensity. No hemorrhage or mass effect. No other restricted diffusion. Chronic T2 and FLAIR hyperintensity in the posterior left temporal lobe is unchanged since 2012 and now favored to be post ischemic encephalomalacia. Occasional scattered subcortical white matter foci of T2 and FLAIR hyperintensity elsewhere have not significantly changed since 2012. However, there is a new but chronic appearing area of abnormal T2 and FLAIR hyperintensity in the left lateral cerebellar peduncle on series 6, image 6. No chronic cerebral blood products identified. No midline shift, mass effect, evidence of mass lesion, ventriculomegaly, extra-axial collection or acute intracranial hemorrhage. Cervicomedullary junction and pituitary are within normal limits. Vascular: Major intracranial vascular flow voids are stable since 2012 and appear normal. Skull and upper cervical spine: Negative. Normal bone marrow signal. Sinuses/Orbits: Stable and negative. Other: Visible internal auditory structures appear normal. Mastoids are clear. Negative scalp soft tissues. MRA HEAD FINDINGS Antegrade flow in the posterior circulation. Normal distal vertebral arteries. Normal basilar artery. SCA and left PCA origins are normal. Fetal type right PCA origin. Left posterior communicating artery is diminutive. Bilateral PCA branches are normal. Antegrade flow in both ICA siphons. No siphon stenosis. Normal ophthalmic and posterior communicating  artery origins. Normal carotid termini, MCA and ACA origins. The left  ACA is occluded in the proximal A2 segment. The anterior communicating artery might be fenestrated where there might be a filling defect at the left A2 origin on series 4, image 105. There is brief reconstitution of the more distal A2 segment before it appears occluded once more. The right ACA appears normal. Bilateral MCA origins, M1 segments, MCA bifurcations, and visible bilateral MCA branches are within normal limits. IMPRESSION: 1. Acute left ACA territory infarcts, primarily the distal territory at the right lower extremity representation area. No associated hemorrhage or mass effect. 2. Associated left A2 occlusion. Otherwise negative intracranial MRA. 3. Bilateral basal ganglia lacunar infarcts have occurred since 2012. Chronic signal abnormality in the posterior left temporal lobe now favored to be post ischemic encephalomalacia, and there is a new similar area of encephalomalacia in the left cerebellar peduncle. 4. Given the above, consider a cardiac or paradoxical embolic source such as patent foraminal ovale. This study was discussed by telephone with Dr. Roland Rack on 09/27/2016 at 20:34 . Electronically Signed   By: Genevie Ann M.D.   On: 09/27/2016 20:35   12-lead ECG sinus rhythm All prior EKG's in EPIC reviewed with no documented atrial fibrillation  Telemetry sinus rhythm  Assessment and Plan:  1. Cryptogenic stroke The patient presents with cryptogenic stroke.  The patient has a TEE planned for this AM.  I spoke at length with the patient about monitoring for afib with an implantable loop recorder.  Risks, benefits, and alteratives to implantable loop recorder were discussed with the patient today.   At this time, the patient is very clear in their decision to proceed with implantable loop recorder.   Wound care was reviewed with the patient (keep incision clean and dry for 3 days).     Please call with questions.   Chanetta Marshall, NP 10/01/2016 9:30 AM  Pt seen and examined,  interviewed and procedure reviewed Cryptogenic Stroke with intracranial stenosis now of DAPT w high dose ASA

## 2016-10-01 NOTE — Progress Notes (Signed)
  Echocardiogram Echocardiogram Transesophageal has been performed.  Bernadean Saling G Barnaby Rippeon 10/01/2016, 3:07 PM

## 2016-10-01 NOTE — Interval H&P Note (Signed)
History and Physical Interval Note:  10/01/2016 2:07 PM  Lynn Rollins  has presented today for surgery, with the diagnosis of STROKE  The various methods of treatment have been discussed with the patient and family. After consideration of risks, benefits and other options for treatment, the patient has consented to  Procedure(s): TRANSESOPHAGEAL ECHOCARDIOGRAM (TEE) (N/A) as a surgical intervention .  The patient's history has been reviewed, patient examined, no change in status, stable for surgery.  I have reviewed the patient's chart and labs.  Questions were answered to the patient's satisfaction.     Mertie Moores

## 2016-10-01 NOTE — Progress Notes (Signed)
PT Cancellation Note  Patient Details Name: Lynn Rollins MRN: [id number] DOB: 1965/10/20   Cancelled Treatment:    Reason Eval/Treat Not Completed: Patient at procedure or test/unavailable   Duncan Dull 10/01/2016, 1:33 PM

## 2016-10-01 NOTE — H&P (View-Only) (Signed)
PROGRESS NOTE   Lynn Rollins  DJS:[id number]    DOB: 1966-01-05    DOA: 09/27/2016  PCP: Nicoletta Dress, MD   I have briefly reviewed patients previous medical records in [facility].  Brief Narrative:  51 year old female with PMH of asthma/COPD despite lifelong nonsmoking, anxiety, depression, fibromyalgia, HTN, prior strokes with residual right limp and on Plavix PTA,? Borderline DM,? Prior brain contusion, presented to ED with new onset right-sided weakness and "soreness". Symptoms started around 11:30 AM on 09/27/16 and are persistent. MRI confirms acute stroke. Ongoing stroke workup. Neurology consulting. Plan for TEE & Loop recorder 6/4.After stroke workup is complete, likely DC to CIR.   Assessment & Plan:   Principal Problem:   Acute ischemic left ACA stroke (HCC) Active Problems:   Cerebral embolism with cerebral infarction   Acute bronchitis   HTN (hypertension)   History of stroke   1. Acute stroke: Small left ACA territory infarctions. CT head: Normal. MRI brain: Acute left ACA territory infarcts. Old bilateral basal ganglia lacunar infarcts. MRA brain: Apart from left A2 occlusion, negative. LDL 48. A1c 5.7. 2-D echo: LVEF 60-65 percent and carotid Dopplers: 1-39 percent bilateral ICA stenosis, vertebral artery shows antegrade flow. Therapies evaluation recommend CIR-consulted, rehabilitation M.D. input appreciated and felt appropriate candidate but to follow progress. Stroke team follow-up appreciated and checking TEE and loop recorder for PAF - Cardiology plans 6/4, lower extremity venous Dopplers pending, hypercoagulable panel and autoimmune workup pending and recommend DAPT with aspirin & Plavix for 3 months and then Plavix alone due to intracranial stenosis. Resultant right hemiparesis, strength continues to improve. Etiology felt to be embolic of unknown source. 2. Recent acute bronchitis: Completed 5 days of levofloxacin-discontinued. No steroids  indicated. 3. Essential hypertension: Home antihypertensives on hold to allow for permissive hypertension due to acute stroke. May consider resuming one of her multiple antihypertensives and Lasix at discharge. 4. Asthma/COPD: Stable without clinical bronchospasm. Continue Spiriva furoate/Vilanterol and when necessary bronchodilators. 5. Anxiety and depression: Continue venlafaxine. 6. Hypokalemia: Replaced. 7. Leukocytosis: Unclear etiology.? Stress response. Resolved. 8. Fibromyalgia/chronic pain: States that she takes Vicodin 3/25, 1 tab 3 times a day, changed to same due to ongoing pain issues.   DVT prophylaxis: Lovenox Code Status: Full Family Communication: None at bedside Disposition: DC possibly to CIR on 10/02/16, pending completion of stroke workup.   Consultants:  Neurology  Rehabilitation M.D.  Procedures:  Echo 09/29/16:  Study Conclusions  - Left ventricle: The cavity size was normal. Wall thickness was   normal. Systolic function was normal. The estimated ejection   fraction was in the range of 60% to 65%. Wall motion was normal;   there were no regional wall motion abnormalities. Left   ventricular diastolic function parameters were normal.  Antimicrobials:  Levofloxacin    Subjective: Seen this morning prior to procedures. Complains of ongoing chronic pain due to her fibromyalgia and concerned that she is not getting her Vicodin like she takes it at home. Right-sided strength continues to improve. No new complaints reported.  ROS: No chest pain, palpitations or dyspnea.  Objective:  Vitals:   10/01/16 0043 10/01/16 0440 10/01/16 0733 10/01/16 1100  BP: (!) 144/69 (!) 155/81  (!) 155/89  Pulse: 77 73  70  Resp: 18 20  18   Temp: 98.2 F (36.8 C) 98.5 F (36.9 C)  98.2 F (36.8 C)  TempSrc: Oral Oral  Oral  SpO2: 98% 98% 96% 95%  Weight:      Height:  Examination:  General exam: Pleasant young female, moderately built and nourished, Lying  comfortably propped up in bed. Respiratory system: Clear to auscultation without increased work of breathing. Cardiovascular system: S1 and S2 heard, RRR. No JVD, murmurs or pedal edema. Telemetry personally reviewed: Sinus rhythm and no arrhythmias noted . Gastrointestinal system: Abdomen is nondistended, soft and nontender. No organomegaly or masses felt. Normal bowel sounds heard. Central nervous system: Alert and oriented. No cranial nerve deficits appreciated. Extremities: Left limbs grade 5 x 5 power. Right limbs at least grade 4 x 5 power-right lower extremity seems weaker than upper extremity but also suspect that she is not providing adequate effort- seems to be ongoing issue. Right pronator drift +. Skin: No rashes, lesions or ulcers Psychiatry: Judgement and insight appear normal. Mood & affect: Pleasant and appropriate. Not anxious.    Data Reviewed: I have personally reviewed following labs and imaging studies  CBC:  Recent Labs Lab 09/27/16 1611 09/27/16 1625 09/29/16 0653  WBC 15.1*  --  10.6*  NEUTROABS 10.1*  --   --   HGB 14.7 15.6* 14.5  HCT 42.8 46.0 44.8  MCV 88.8  --  92.2  PLT 302  --  656   Basic Metabolic Panel:  Recent Labs Lab 09/27/16 1611 09/27/16 1625 09/29/16 0653 10/01/16 0532  NA 137 138 138 137  K 3.3* 3.3* 4.2 4.0  CL 100* 99* 102 105  CO2 25  --  29 25  GLUCOSE 143* 140* 114* 113*  BUN 12 15 16 15   CREATININE 0.85 0.70 0.91 0.72  CALCIUM 9.1  --  9.5 9.4  MG  --   --  1.9  --    Liver Function Tests:  Recent Labs Lab 09/27/16 1611  AST 28  ALT 35  ALKPHOS 86  BILITOT 0.6  PROT 6.9  ALBUMIN 3.8   Coagulation Profile:  Recent Labs Lab 09/27/16 1611  INR 0.95          Radiology Studies: No results found.      Scheduled Meds: . aspirin EC  325 mg Oral Daily  . clopidogrel  75 mg Oral Daily  . enoxaparin (LOVENOX) injection  40 mg Subcutaneous QHS  . famotidine  40 mg Oral QHS  . [START ON 10/02/2016]  fluticasone furoate-vilanterol  1 puff Inhalation Daily  . HYDROcodone-acetaminophen  1 tablet Oral TID  . pantoprazole  80 mg Oral Daily  . temazepam  30 mg Oral QHS  . tiotropium  18 mcg Inhalation QHS   Continuous Infusions: . sodium chloride       LOS: 4 days     HONGALGI,ANAND, MD, FACP, FHM. [facility] Pager [phone]  If 7PM-7AM, please contact night-coverage [email or web address] Password [email or web address] 10/01/2016, 12:51 PM

## 2016-10-01 NOTE — Care Management Important Message (Signed)
Important Message  Patient Details  Name: Lynn Rollins MRN: [id number] Date of Birth: 06-Feb-1966   Medicare Important Message Given:  Yes    Orbie Pyo 10/01/2016, 12:03 PM

## 2016-10-01 NOTE — Progress Notes (Signed)
STROKE TEAM PROGRESS NOTE   SUBJECTIVE   no family at bedside. She is walking with walker in room and she has mild dragging in her left leg. She complains of constant lightheadedness not vertigo. It has been like this for a while before she was admitted. She concerns whether her home medication caused her lightheadedness. Her BP was somehow high during admission due to anxiety.   OBJECTIVE Temp:  [97.9 F (36.6 C)-98.5 F (36.9 C)] 97.9 F (36.6 C) (06/04 1447) Pulse Rate:  [66-96] 77 (06/04 1553) Cardiac Rhythm: Normal sinus rhythm (06/04 0700) Resp:  [12-35] 19 (06/04 1553) BP: (119-217)/(54-128) 179/93 (06/04 1550) SpO2:  [92 %-99 %] 96 % (06/04 1553)  CBC:   Recent Labs Lab 09/27/16 1611 09/27/16 1625 09/29/16 0653  WBC 15.1*  --  10.6*  NEUTROABS 10.1*  --   --   HGB 14.7 15.6* 14.5  HCT 42.8 46.0 44.8  MCV 88.8  --  92.2  PLT 302  --  112    Basic Metabolic Panel:   Recent Labs Lab 09/29/16 0653 10/01/16 0532  NA 138 137  K 4.2 4.0  CL 102 105  CO2 29 25  GLUCOSE 114* 113*  BUN 16 15  CREATININE 0.91 0.72  CALCIUM 9.5 9.4  MG 1.9  --    HgbA1c:  Lab Results  Component Value Date   HGBA1C 5.7 (H) 09/28/2016   I have personally reviewed the radiological images below and agree with the radiology interpretations.  Dg Chest 2 View 09/27/2016 IMPRESSION: No active cardiopulmonary disease.   Mri and Mra Head Wo Contrast 09/27/2016 IMPRESSION: 1. Acute left ACA territory infarcts, primarily the distal territory at the right lower extremity representation area. No associated hemorrhage or mass effect. 2. Associated left A2 occlusion. Otherwise negative intracranial MRA. 3. Bilateral basal ganglia lacunar infarcts have occurred since 2012. Chronic signal abnormality in the posterior left temporal lobe now favored to be post ischemic encephalomalacia, and there is a new similar area of encephalomalacia in the left cerebellar peduncle. 4. Given the above, consider  a cardiac or paradoxical embolic source such as patent foraminal ovale.   Ct Head Code Stroke W/o Cm 09/27/2016 IMPRESSION: 1. Stable and normal noncontrast CT appearance of the brain. 2. ASPECTS is 10.   TTE  Left ventricle: The cavity size was normal. Wall thickness was   normal. Systolic function was normal. The estimated ejection   fraction was in the range of 60% to 65%. Wall motion was normal;   there were no regional wall motion abnormalities. Left   ventricular diastolic function parameters were normal.  CUS - Bilateral: 1-39% ICA stenosis. Vertebral artery flow is antegrade.  LE venous doppler No evidence of DVT or baker's cyst.  TEE Left Ventrical:  Normal LV function  Mitral Valve: trace MR , normal MV Aortic Valve: normal AV  Tricuspid Valve: normal  Pulmonic Valve: normal  Left Atrium/ Left atrial appendage: no thrombi  Atrial septum: no evidence of PFO or ASD.   There was a single bubble that appeared in the LA after  8 cardiac cycles .  This is c/w a pulmoonary AVM or similar shunt  Aorta: normal    PHYSICAL EXAM Pleasant middle-aged lady, obese, currently not in distress. Afebrile. Head is nontraumatic. Neck is supple without bruit. Cardiac exam no murmur or gallop. Lungs are clear to auscultation. Distal pulses are well felt.  Neurological Exam ;  Awake  Alert oriented x 3. Normal speech and language.eye  movements full without nystagmus.fundi were not visualized. Vision acuity and fields appear normal. Hearing is normal. Palatal movements are normal. Face symmetric. Tongue midline. Normal strength, tone, reflexes and coordination, except mild right hip flexors and ankle dorsiflexor weakness. Decreased RLE sensation. Gait deferred.  ASSESSMENT/PLAN [name] is a 51 y.o. female with history of HTN, depression, fibromyalgia and stroke presenting with R sided weakness. She did not receive IV t-PA due to being out of the time window.   Stroke:   L ACA  infarcts embolic secondary to unknown source  Resultant  RLE paresis  Code Stroke CT no acute stroke. Aspects 10.    MRI  Acute L ACA infarcts. B BG infarcts new since 21012. Also old infarcts L temporal lobe and L cerebellar peduncle  MRA  L A2 occlusion  Carotid Doppler - unremarkable  2D Echo - EF 60-65%. No cardiac source of emboli identified.  LE venous doppler - no DVT  TEE unremarkable  Loop recorder pending   LDL 48  HgbA1c 5.7  Hypercoagulable and autoimmune work up pending  Lovenox 40 mg sq daily for VTE prophylaxis Diet NPO time specified  clopidogrel 75 mg daily prior to admission, now on clopidogrel 75 mg daily. Recommend DAPT with ASA and plavix for 3 months and then plavix alone due to intracranial stenosis.  Therapy recommendations: CIR   Disposition:  pending   Hypertension  Stable Permissive hypertension (OK if < 220/120) but gradually normalize in 5-7 days Long-term BP goal normotensive  Other Stroke Risk Factors  Obesity, Body mass index is 37.41 kg/m.  Hx stroke in 2011  Other Active Problems  Acute bronchitis - finished levaquin  Depression  Fibromyalgia  Anxiety   Hospital day # 4   Neurology will sign off. Please call with questions. Pt will follow up with stroke clinic at [facility] in about 6 weeks. Thanks for the consult.  Rosalin Hawking, MD PhD Stroke Neurology 10/01/2016 4:26 PM     To contact Stroke Continuity provider, please refer to [email or web address] After hours, contact General Neurology

## 2016-10-01 NOTE — Progress Notes (Signed)
Occupational Therapy Treatment Patient Details Name: Lynn Rollins MRN: [id number] DOB: 12-30-1965 Today's Date: 10/01/2016    History of present illness Lynn Rollins is a 51 y.o. female with medical history significant of HTN, depression, fibromyalgia, stroke. MRI brain positive for small left ACA territory ischemic infarctions.   OT comments  Pt demonstrating progress toward OT goals. She was able to complete standing grooming tasks to brush hair with R UE with min guard assist for balance. She did demonstrate increased fatigue and decreased grasp strength with hair brushing tasks requiring therapeutic rest breaks and min assist for brushing through tangles. She was able to identify increasing fatigue and request seated rest break without verbal cues. Educated pt concerning energy conservation strategies and she reports understanding. Pt able to complete toilet transfers with min guard assist this session as well. Continue to feel that pt would best benefit from CIR level therapies post-acute D/C in order to maximize return to independent PLOF. Will continue to follow acutely.    Follow Up Recommendations  CIR;Supervision/Assistance - 24 hour    Equipment Recommendations  Other (comment) (TBD at next venue of care)    Recommendations for Other Services Rehab consult    Precautions / Restrictions Precautions Precautions: Fall Restrictions Weight Bearing Restrictions: No       Mobility Bed Mobility Overal bed mobility: Needs Assistance Bed Mobility: Supine to Sit     Supine to sit: Supervision     General bed mobility comments: Supervision for safety.   Transfers Overall transfer level: Needs assistance Equipment used: Rolling walker (2 wheeled) Transfers: Sit to/from [facility] Sit to Stand: Min guard         General transfer comment: Min guard assist for safety and steadying on rise to stand.     Balance Overall balance assessment: Needs  assistance Sitting-balance support: Feet supported;No upper extremity supported Sitting balance-Leahy Scale: Good     Standing balance support: Bilateral upper extremity supported;No upper extremity supported;During functional activity Standing balance-Leahy Scale: Fair Standing balance comment: Able to statically stand at sink for grooming tasks without UE support.                            ADL either performed or assessed with clinical judgement   ADL Overall ADL's : Needs assistance/impaired     Grooming: Brushing hair;Min guard;Standing Grooming Details (indicate cue type and reason): Pt with tendency to use L UE when becoming fatigued. Able to complete hair brushing activity with min guard assist standing at sink. She did require min assist for untangled areas of hair. Decreased strength noted with increased effort to maintain grasp on comb during task.                  Toilet Transfer: Min guard;Ambulation;BSC;RW Toilet Transfer Details (indicate cue type and reason): Min guard assist for safety with ambulation.         Functional mobility during ADLs: Min guard;Rolling walker General ADL Comments: Pt with good awareness of fatigue and need for seated rest break while standing at sink following grooming activities. Facilitated improved R UE strength with daily self-care activities this session.      Vision   Vision Assessment?: No apparent visual deficits   Perception     Praxis      Cognition Arousal/Alertness: Awake/alert Behavior During Therapy: WFL for tasks assessed/performed Overall Cognitive Status: Within Functional Limits for tasks assessed  Exercises     Shoulder Instructions       General Comments      Pertinent Vitals/ Pain       Pain Assessment: Faces Faces Pain Scale: Hurts little more Pain Location: back - chronic pain Pain Descriptors / Indicators: Aching;Sore Pain  Intervention(s): Limited activity within patient's tolerance;Monitored during session;Premedicated before session;Repositioned  Home Living                                          Prior Functioning/Environment              Frequency  Min 3X/week        Progress Toward Goals  OT Goals(current goals can now be found in the care plan section)  Progress towards OT goals: Progressing toward goals  Acute Rehab OT Goals Patient Stated Goal: to get back to PLOF OT Goal Formulation: With patient Time For Goal Achievement: 10/12/16 Potential to Achieve Goals: Good  Plan Discharge plan remains appropriate    Co-evaluation                 AM-PAC PT "6 Clicks" Daily Activity     Outcome Measure   Help from another person eating meals?: A Little Help from another person taking care of personal grooming?: A Little Help from another person toileting, which includes using toliet, bedpan, or urinal?: A Little Help from another person bathing (including washing, rinsing, drying)?: A Little Help from another person to put on and taking off regular upper body clothing?: A Little Help from another person to put on and taking off regular lower body clothing?: A Lot 6 Click Score: 17    End of Session Equipment Utilized During Treatment: Gait belt;Rolling walker  OT Visit Diagnosis: Other abnormalities of gait and mobility (R26.89);Unsteadiness on feet (R26.81);Muscle weakness (generalized) (M62.81);Other symptoms and signs involving the nervous system (R29.898);Feeding difficulties (R63.3);Hemiplegia and hemiparesis Hemiplegia - Right/Left: Right Hemiplegia - dominant/non-dominant: Dominant Hemiplegia - caused by: Cerebral infarction   Activity Tolerance Patient tolerated treatment well   Patient Left in chair;with call bell/phone within reach   Nurse Communication Mobility status        Time: 5277-8242 OT Time Calculation (min): 20 min  Charges: OT  General Charges $OT Visit: 1 Procedure OT Treatments $Self Care/Home Management : 8-22 mins  Norman Herrlich, MS OTR/L  Pager: [place] A Khadeem Rockett 10/01/2016, 5:13 PM

## 2016-10-01 NOTE — Progress Notes (Signed)
*  PRELIMINARY RESULTS* Vascular Ultrasound Lower extremity venous duplex has been completed.  Preliminary findings: No evidence of DVT or baker's cyst.    Landry Mellow, RDMS, RVT  10/01/2016, 12:56 PM

## 2016-10-01 NOTE — Progress Notes (Signed)
Dr Acie Fredrickson aware of pre procedure BP 217/112. Will await sedation effects on BP.

## 2016-10-01 NOTE — Progress Notes (Signed)
Rehab admissions - I met with patient this am.  She would like inpatient rehab admission.  I have called and opened the case with [employer] requesting acute inpatient rehab admission.  I will follow up with all once I hear back from insurance case manager.  Call me for questions.  #[phone] 

## 2016-10-01 NOTE — CV Procedure (Signed)
    Transesophageal Echocardiogram Note  Lynn Rollins [id number] 10/27/1965  Procedure: Transesophageal Echocardiogram Indications: CVA   Procedure Details Consent: Obtained Time Out: Verified patient identification, verified procedure, site/side was marked, verified correct patient position, special equipment/implants available, Radiology Safety Procedures followed,  medications/allergies/relevent history reviewed, required imaging and test results available.  Performed  Medications:  During this procedure the patient is administered a total of Versed 7  mg and Fentanyl 75  mcg  to achieve and maintain moderate conscious sedation.  The patient's heart rate, blood pressure, and oxygen saturation are monitored continuously during the procedure. The period of conscious sedation is 30 minutes, of which I was present face-to-face 100% of this time.  Left Ventrical:  Normal LV function   Mitral Valve: trace MR , normal MV  Aortic Valve: normal AV   Tricuspid Valve: normal   Pulmonic Valve: normal   Left Atrium/ Left atrial appendage: no thrombi   Atrial septum: no evidence of PFO or ASD.   There was a single bubble that appeared in the LA after  8 cardiac cycles .  This is c/w a pulmoonary AVM or similar shunt   Aorta: normal    Complications: No apparent complications Patient did tolerate procedure well.   Lynn Rollins, Lynn Rollins., MD, [facility] 10/01/2016, 2:27 PM

## 2016-10-01 NOTE — Progress Notes (Signed)
Rehab admissions - we have received a denial for acute inpatient rehab admission.  The insurance carrier says that patient needs can be met at a lower level of care such as SNF or HH therapies.  I have made the case manager aware.  Call me for questions.  #[phone]

## 2016-10-02 ENCOUNTER — Encounter (HOSPITAL_COMMUNITY): Payer: Self-pay | Admitting: Internal Medicine

## 2016-10-02 DIAGNOSIS — J208 Acute bronchitis due to other specified organisms: Secondary | ICD-10-CM

## 2016-10-02 LAB — VAS US CAROTID
LEFT ECA DIAS: -7 cm/s
LEFT VERTEBRAL DIAS: -12 cm/s
Left CCA dist dias: -16 cm/s
Left CCA dist sys: -55 cm/s
Left CCA prox dias: 13 cm/s
Left CCA prox sys: 81 cm/s
Left ICA dist dias: -29 cm/s
Left ICA dist sys: -87 cm/s
Left ICA prox dias: 17 cm/s
Left ICA prox sys: 55 cm/s
RIGHT ECA DIAS: -6 cm/s
RIGHT VERTEBRAL DIAS: -14 cm/s
Right CCA prox dias: -12 cm/s
Right CCA prox sys: -66 cm/s
Right cca dist sys: -53 cm/s

## 2016-10-02 LAB — ANTI-DNA ANTIBODY, DOUBLE-STRANDED

## 2016-10-02 LAB — CARDIOLIPIN ANTIBODIES, IGG, IGM, IGA: Anticardiolipin IgM: <9 MPL U/mL (ref 0–12)

## 2016-10-02 LAB — ANTINUCLEAR ANTIBODIES, IFA: ANTINUCLEAR ANTIBODIES, IFA: NEGATIVE

## 2016-10-02 LAB — BETA-2-GLYCOPROTEIN I ABS, IGG/M/A
Beta-2 Glyco I IgG: <9 GPI IgG units (ref 0–20)
Beta-2-Glycoprotein I IgA: <9 GPI IgA units (ref 0–25)
Beta-2-Glycoprotein I IgM: <9 GPI IgM units (ref 0–32)

## 2016-10-02 LAB — HOMOCYSTEINE: Homocysteine: 12 umol/L (ref 0.0–15.0)

## 2016-10-02 MED ORDER — ASPIRIN 325 MG PO TBEC: 325.0000 mg | DELAYED_RELEASE_TABLET | Freq: Every day | ORAL | 0 refills | Status: DC

## 2016-10-02 MED ORDER — DIAZEPAM 5 MG PO TABS: 2.5000 mg | ORAL_TABLET | Freq: Three times a day (TID) | ORAL | 0 refills | Status: DC | PRN

## 2016-10-02 MED ORDER — HYDROCODONE-ACETAMINOPHEN 5-325 MG PO TABS: 1.0000 | ORAL_TABLET | Freq: Three times a day (TID) | ORAL | 0 refills | Status: DC

## 2016-10-02 MED ORDER — TEMAZEPAM 30 MG PO CAPS: 30.0000 mg | ORAL_CAPSULE | Freq: Every day | ORAL | 5 refills | Status: DC

## 2016-10-02 NOTE — Care Management Note (Signed)
Case Management Note  Patient Details  Name: Lynn Rollins MRN: [id number] Date of Birth: [birth year]  Subjective/Objective:                    Action/Plan: Pt discharging home with [facility] services. CM met with the patient and she would like to use [facility] (pts husband works for [employer]). Santiago Glad with [facility] notified and accepted the referral.  Pt with orders for youth walker, shower chair and 3 in 1. [employer] will not cover the shower seat so pt refused. Santiago Glad notified of the walker and 3 in 1 and will deliver them to the room. Pt states she has a lot of assistance at home from her son, parents and husband.  Family to provide transportation home.  Expected Discharge Date:  10/02/16               Expected Discharge Plan:  [place]  In-House Referral:     Discharge planning Services  CM Consult  Post Acute Care Choice:  Durable Medical Equipment, Home Health Choice offered to:  Patient  DME Arranged:  3-N-1, Walker youth DME Agency:  [place] Arranged:  PT, OT, Nurse's Aide [place] Agency:  [place]  Status of Service:  Completed, signed off  If discussed at [place] of Stay Meetings, dates discussed:    Additional Comments:  Pollie Friar, RN 10/02/2016, 10:30 AM

## 2016-10-02 NOTE — Discharge Summary (Addendum)
Physician Discharge Summary  RAYHANA SLIDER MRN: [id number] DOB/AGE: 1965-10-27 51 y.o.  PCP: Nicoletta Dress, MD   Admit date: 09/27/2016 Discharge date: 10/02/2016  Discharge Diagnoses:    Principal Problem:   Acute ischemic left ACA stroke [facility]) Active Problems:   Cerebral embolism with cerebral infarction   Acute bronchitis   HTN (hypertension)   History of stroke    Follow-up recommendations Follow-up with PCP in 3-5 days , including all  additional recommended appointments as below Follow-up CBC, CMP in 3-5 days Pt will follow up with stroke clinic at [facility] in about 6 weeks     Current Discharge Medication List    START taking these medications   Details  aspirin EC 325 MG EC tablet Take 1 tablet (325 mg total) by mouth daily. Qty: 30 tablet, Refills: 0      CONTINUE these medications which have CHANGED   Details  diazepam (VALIUM) 5 MG tablet Take 0.5 tablets (2.5 mg total) by mouth 3 (three) times daily as needed for anxiety. Qty: 30 tablet, Refills: 0    HYDROcodone-acetaminophen (NORCO/VICODIN) 5-325 MG tablet Take 1 tablet by mouth 3 (three) times daily. Qty: 10 tablet, Refills: 0    temazepam (RESTORIL) 30 MG capsule Take 1 capsule (30 mg total) by mouth at bedtime. Qty: 30 capsule, Refills: 5      CONTINUE these medications which have NOT CHANGED   Details  albuterol (PROVENTIL HFA;VENTOLIN HFA) 108 (90 BASE) MCG/ACT inhaler Inhale 2 puffs into the lungs every 4 (four) hours as needed for wheezing.     amLODipine (NORVASC) 10 MG tablet Take 10 mg by mouth daily.    BREO ELLIPTA 200-25 MCG/INH AEPB Inhale 1 puff into the lungs daily. Refills: 3    citalopram (CELEXA) 20 MG tablet Take 20 mg by mouth daily as needed (for mood).     clopidogrel (PLAVIX) 75 MG tablet Take 75 mg by mouth daily.    DEXILANT 60 MG capsule Take 60 mg by mouth daily. Refills: 4    dicyclomine (BENTYL) 20 MG tablet Take 20 mg by mouth every 6 (six) hours as needed  for spasms.     furosemide (LASIX) 40 MG tablet Take 40 mg by mouth 2 (two) times daily. Refills: 12    labetalol (NORMODYNE) 300 MG tablet Take 300 mg by mouth 2 (two) times daily. Refills: 3          lisinopril (PRINIVIL,ZESTRIL) 40 MG tablet Take 40 mg by mouth daily.    mupirocin ointment (BACTROBAN) 2 % Apply 1 application topically 3 (three) times daily.    orphenadrine (NORFLEX) 100 MG tablet Take 100 mg by mouth 2 (two) times daily as needed for muscle spasms.     predniSONE (DELTASONE) 10 MG tablet See admin instructions. TAKE 4 TABS X3 DAYS THEN 3 TABS X3 DAYS THEN 2 TABS X3 DAYS THEN 1 TAB X3 DAYS THEN STOP Refills: 3    promethazine (PHENERGAN) 25 MG tablet Take 25 mg by mouth every 6 (six) hours as needed for nausea.     ranitidine (ZANTAC) 300 MG tablet Take 300 mg by mouth at bedtime. Refills: 3    SPIRIVA RESPIMAT 1.25 MCG/ACT AERS Inhale 1 puff into the lungs 2 (two) times daily. Refills: 3    venlafaxine (EFFEXOR) 75 MG tablet Take 75 mg by mouth daily. Refills: 12    Vitamin D, Ergocalciferol, (DRISDOL) 50000 units CAPS capsule Take 50,000 Units by mouth once a week. Refills: 12  cloNIDine (CATAPRES) 0.1 MG tablet Take 0.1 mg by mouth 3 (three) times daily.         Discharge Condition:  Stable   Discharge Instructions Get Medicines reviewed and adjusted: Please take all your medications with you for your next visit with your Primary MD  Please request your Primary MD to go over all hospital tests and procedure/radiological results at the follow up, please ask your Primary MD to get all Hospital records sent to his/her office.  If you experience worsening of your admission symptoms, develop shortness of breath, life threatening emergency, suicidal or homicidal thoughts you must seek medical attention immediately by calling 911 or calling your MD immediately if symptoms less severe.  You must read complete instructions/literature along with all  the possible adverse reactions/side effects for all the Medicines you take and that have been prescribed to you. Take any new Medicines after you have completely understood and accpet all the possible adverse reactions/side effects.   Do not drive when taking Pain medications.   Do not take more than prescribed Pain, Sleep and Anxiety Medications  Special Instructions: If you have smoked or chewed Tobacco in the last 2 yrs please stop smoking, stop any regular Alcohol and or any Recreational drug use.  Wear Seat belts while driving.  Please note  You were cared for by a hospitalist during your hospital stay. Once you are discharged, your primary care physician will handle any further medical issues. Please note that NO REFILLS for any discharge medications will be authorized once you are discharged, as it is imperative that you return to your primary care physician (or establish a relationship with a primary care physician if you do not have one) for your aftercare needs so that they can reassess your need for medications and monitor your lab values.  Discharge Instructions    Ambulatory referral to Neurology    Complete by:  As directed    Pt will follow up with stroke MD Erlinda Hong preferred, if not available, then consider Leonie Man, Penumalli or Ahern) at [facility] in about 2 months. Thanks.   Care order/instruction    Complete by:  As directed    Remove bulky dressing in Am Steristrips in place until seen  In office Keep wound dry for 2days   Wound check in office , to be scheduled prior to release   Diet - low sodium heart healthy    Complete by:  As directed    Increase activity slowly    Complete by:  As directed        Allergies  Allergen Reactions  . Statins Other (See Comments)    Patient cannot recall reaction  . Tape Other (See Comments)    Patient has Fibromyalgia and cannot tolerate medical tape and it's removal      Disposition: SNF   Consults:  Neurology  Significant  Diagnostic Studies:  Dg Chest 2 View  Result Date: 09/27/2016 CLINICAL DATA:  Acute ischemic left ACA stroke. Right-sided weakness. EXAM: CHEST  2 VIEW COMPARISON:  07/05/2013 CXR FINDINGS: The heart size and mediastinal contours are within normal limits. Both lungs are clear. The visualized skeletal structures are unremarkable. IMPRESSION: No active cardiopulmonary disease. Electronically Signed   By: Ashley Royalty M.D.   On: 09/27/2016 22:30   Mr Angiogram Head Wo Contrast  Result Date: 09/27/2016 CLINICAL DATA:  51 year old female with right side deficits, headache. Recent diplopia. EXAM: MRI HEAD WITHOUT CONTRAST MRA HEAD WITHOUT CONTRAST TECHNIQUE: Multiplanar, multiecho pulse sequences  of the brain and surrounding structures were obtained without intravenous contrast. Angiographic images of the head were obtained using MRA technique without contrast. COMPARISON:  Head CT without contrast 1628 hours today. Brain MRI 09/28/2010. FINDINGS: MRI HEAD FINDINGS Brain: Small bilateral basal ganglia lacunar infarcts are chronic but have developed since 2012. There is a small 2 cm area of restricted diffusion in the cortex of the left medial left motor strip near midline. Restricted diffusion tracks into the nearby subcortical white matter. See series 3 images 38 through 41. There is also a small focus of restricted diffusion in the more anterior left cingulate gyrus on image 37. Mild associated T2 and FLAIR hyperintensity. No hemorrhage or mass effect. No other restricted diffusion. Chronic T2 and FLAIR hyperintensity in the posterior left temporal lobe is unchanged since 2012 and now favored to be post ischemic encephalomalacia. Occasional scattered subcortical white matter foci of T2 and FLAIR hyperintensity elsewhere have not significantly changed since 2012. However, there is a new but chronic appearing area of abnormal T2 and FLAIR hyperintensity in the left lateral cerebellar peduncle on series 6, image 6.  No chronic cerebral blood products identified. No midline shift, mass effect, evidence of mass lesion, ventriculomegaly, extra-axial collection or acute intracranial hemorrhage. Cervicomedullary junction and pituitary are within normal limits. Vascular: Major intracranial vascular flow voids are stable since 2012 and appear normal. Skull and upper cervical spine: Negative. Normal bone marrow signal. Sinuses/Orbits: Stable and negative. Other: Visible internal auditory structures appear normal. Mastoids are clear. Negative scalp soft tissues. MRA HEAD FINDINGS Antegrade flow in the posterior circulation. Normal distal vertebral arteries. Normal basilar artery. SCA and left PCA origins are normal. Fetal type right PCA origin. Left posterior communicating artery is diminutive. Bilateral PCA branches are normal. Antegrade flow in both ICA siphons. No siphon stenosis. Normal ophthalmic and posterior communicating artery origins. Normal carotid termini, MCA and ACA origins. The left ACA is occluded in the proximal A2 segment. The anterior communicating artery might be fenestrated where there might be a filling defect at the left A2 origin on series 4, image 105. There is brief reconstitution of the more distal A2 segment before it appears occluded once more. The right ACA appears normal. Bilateral MCA origins, M1 segments, MCA bifurcations, and visible bilateral MCA branches are within normal limits. IMPRESSION: 1. Acute left ACA territory infarcts, primarily the distal territory at the right lower extremity representation area. No associated hemorrhage or mass effect. 2. Associated left A2 occlusion. Otherwise negative intracranial MRA. 3. Bilateral basal ganglia lacunar infarcts have occurred since 2012. Chronic signal abnormality in the posterior left temporal lobe now favored to be post ischemic encephalomalacia, and there is a new similar area of encephalomalacia in the left cerebellar peduncle. 4. Given the above,  consider a cardiac or paradoxical embolic source such as patent foraminal ovale. This study was discussed by telephone with Dr. Roland Rack on 09/27/2016 at 20:34 . Electronically Signed   By: Genevie Ann M.D.   On: 09/27/2016 20:35   Mr Brain Wo Contrast  Result Date: 09/27/2016 CLINICAL DATA:  51 year old female with right side deficits, headache. Recent diplopia. EXAM: MRI HEAD WITHOUT CONTRAST MRA HEAD WITHOUT CONTRAST TECHNIQUE: Multiplanar, multiecho pulse sequences of the brain and surrounding structures were obtained without intravenous contrast. Angiographic images of the head were obtained using MRA technique without contrast. COMPARISON:  Head CT without contrast 1628 hours today. Brain MRI 09/28/2010. FINDINGS: MRI HEAD FINDINGS Brain: Small bilateral basal ganglia lacunar infarcts are chronic  but have developed since 2012. There is a small 2 cm area of restricted diffusion in the cortex of the left medial left motor strip near midline. Restricted diffusion tracks into the nearby subcortical white matter. See series 3 images 38 through 41. There is also a small focus of restricted diffusion in the more anterior left cingulate gyrus on image 37. Mild associated T2 and FLAIR hyperintensity. No hemorrhage or mass effect. No other restricted diffusion. Chronic T2 and FLAIR hyperintensity in the posterior left temporal lobe is unchanged since 2012 and now favored to be post ischemic encephalomalacia. Occasional scattered subcortical white matter foci of T2 and FLAIR hyperintensity elsewhere have not significantly changed since 2012. However, there is a new but chronic appearing area of abnormal T2 and FLAIR hyperintensity in the left lateral cerebellar peduncle on series 6, image 6. No chronic cerebral blood products identified. No midline shift, mass effect, evidence of mass lesion, ventriculomegaly, extra-axial collection or acute intracranial hemorrhage. Cervicomedullary junction and pituitary are  within normal limits. Vascular: Major intracranial vascular flow voids are stable since 2012 and appear normal. Skull and upper cervical spine: Negative. Normal bone marrow signal. Sinuses/Orbits: Stable and negative. Other: Visible internal auditory structures appear normal. Mastoids are clear. Negative scalp soft tissues. MRA HEAD FINDINGS Antegrade flow in the posterior circulation. Normal distal vertebral arteries. Normal basilar artery. SCA and left PCA origins are normal. Fetal type right PCA origin. Left posterior communicating artery is diminutive. Bilateral PCA branches are normal. Antegrade flow in both ICA siphons. No siphon stenosis. Normal ophthalmic and posterior communicating artery origins. Normal carotid termini, MCA and ACA origins. The left ACA is occluded in the proximal A2 segment. The anterior communicating artery might be fenestrated where there might be a filling defect at the left A2 origin on series 4, image 105. There is brief reconstitution of the more distal A2 segment before it appears occluded once more. The right ACA appears normal. Bilateral MCA origins, M1 segments, MCA bifurcations, and visible bilateral MCA branches are within normal limits. IMPRESSION: 1. Acute left ACA territory infarcts, primarily the distal territory at the right lower extremity representation area. No associated hemorrhage or mass effect. 2. Associated left A2 occlusion. Otherwise negative intracranial MRA. 3. Bilateral basal ganglia lacunar infarcts have occurred since 2012. Chronic signal abnormality in the posterior left temporal lobe now favored to be post ischemic encephalomalacia, and there is a new similar area of encephalomalacia in the left cerebellar peduncle. 4. Given the above, consider a cardiac or paradoxical embolic source such as patent foraminal ovale. This study was discussed by telephone with Dr. Roland Rack on 09/27/2016 at 20:34 . Electronically Signed   By: Genevie Ann M.D.   On:  09/27/2016 20:35   Ct Head Code Stroke W/o Cm  Result Date: 09/27/2016 CLINICAL DATA:  Code stroke. 51 year old female with right side paresis, double vision. EXAM: CT HEAD WITHOUT CONTRAST TECHNIQUE: Contiguous axial images were obtained from the base of the skull through the vertex without intravenous contrast. COMPARISON:  Head and cervical spine CT 07/05/2013 and earlier. FINDINGS: Brain: No acute intracranial hemorrhage identified. No midline shift, mass effect, or evidence of intracranial mass lesion. Gray-white matter differentiation appears stable and within normal limits throughout the brain. No cortically based acute infarct identified. No ventriculomegaly. Vascular: Symmetric appearing intracranial vascular density. No convincing abnormal intracranial vascular hyperdensity. Skull: Negative.  No acute osseous abnormality identified. Sinuses/Orbits: Visualized paranasal sinuses and mastoids are stable and well pneumatized. Other: Visualized orbits and scalp  soft tissues are within normal limits. ASPECTS ([facility] Stroke Program Early CT Score) - Ganglionic level infarction (caudate, lentiform nuclei, internal capsule, insula, M1-M3 cortex): 7 - Supraganglionic infarction (M4-M6 cortex): 3 Total score (0-10 with 10 being normal): 10 IMPRESSION: 1. Stable and normal noncontrast CT appearance of the brain. 2. ASPECTS is 10. 3. The above was relayed via text pager to Dr. Bruce Donath on 09/27/2016 at 16:33 . Electronically Signed   By: Genevie Ann M.D.   On: 09/27/2016 16:33    echocardiogram  LV EF: 60% -   65%  ------------------------------------------------------------------- History:   PMH:  Stroke  Stroke.  Risk factors:  Hypertension.  ------------------------------------------------------------------- Study Conclusions  - Left ventricle: The cavity size was normal. Wall thickness was   normal. Systolic function was normal. The estimated ejection   fraction was in the range of 60% to 65%.  Wall motion was normal;   there were no regional wall motion abnormalities. Left   ventricular diastolic function parameters were normal.   TEE   Mitral Valve: trace MR , normal MV  Aortic Valve: normal AV   Tricuspid Valve: normal   Pulmonic Valve: normal   Left Atrium/ Left atrial appendage: no thrombi   Atrial septum: no evidence of PFO or ASD.   There was a single bubble that appeared in the LA after  8 cardiac cycles .  This is c/w a pulmoonary AVM or similar shunt   Aorta: normal   Filed Weights   09/27/16 1559 09/27/16 2247  Weight: 83.9 kg (185 lb) 81.2 kg (179 lb)     Microbiology: No results found for this or any previous visit (from the past 240 hour(s)).     Blood Culture No results found for: SDES, SPECREQUEST, CULT, REPTSTATUS    Labs: Results for orders placed or performed during the hospital encounter of 09/27/16 (from the past 48 hour(s))  Basic metabolic panel     Status: Abnormal   Collection Time: 10/01/16  5:32 AM  Result Value Ref Range   Sodium 137 135 - 145 mmol/L   Potassium 4.0 3.5 - 5.1 mmol/L   Chloride 105 101 - 111 mmol/L   CO2 25 22 - 32 mmol/L   Glucose, Bld 113 (H) 65 - 99 mg/dL   BUN 15 6 - 20 mg/dL   Creatinine, Ser 0.72 0.44 - 1.00 mg/dL   Calcium 9.4 8.9 - 10.3 mg/dL   GFR calc non Af Amer >60 >60 mL/min   GFR calc Af Amer >60 >60 mL/min    Comment: (NOTE) The eGFR has been calculated using the CKD EPI equation. This calculation has not been validated in all clinical situations. eGFR's persistently <60 mL/min signify possible Chronic Kidney Disease.    Anion gap 7 5 - 15  Sedimentation rate     Status: None   Collection Time: 10/01/16  5:32 AM  Result Value Ref Range   Sed Rate 21 0 - 22 mm/hr  C-reactive protein     Status: None   Collection Time: 10/01/16  5:32 AM  Result Value Ref Range   CRP <0.8 <1.0 mg/dL     Lipid Panel     Component Value Date/Time   CHOL 145 09/28/2016 0518   TRIG 256 (H)  09/28/2016 0518   HDL 46 09/28/2016 0518   CHOLHDL 3.2 09/28/2016 0518   VLDL 51 (H) 09/28/2016 0518   LDLCALC 48 09/28/2016 0518     Lab Results  Component Value Date  HGBA1C 5.7 (H) 09/28/2016       HPI :  11 -year-old female with PMH of asthma/COPD despite lifelong nonsmoking, anxiety, depression, fibromyalgia, HTN, prior strokes with residual right limp and on Plavix PTA,? Borderline DM,? Prior brain contusion, presented to ED with new onset right-sided weakness and "soreness". Symptoms started around 11:30 AM on 09/27/16 and are persistent. MRI confirms acute stroke. Ongoing stroke workup. Neurology consulting. Plan for TEE & Loop recorder 6/4.   HOSPITAL COURSE: *   1. Acute stroke: Small left ACA territory infarctions. CT head: Normal. MRI brain: Acute left ACA territory infarcts. Old bilateral basal ganglia lacunar infarcts. MRA brain: Apart from left A2 occlusion, negative. LDL 48. A1c 5.7. 2-D echo: LVEF 60-65 percent and carotid Dopplers: 1-39 percent bilateral ICA stenosis, vertebral artery shows antegrade flow. Therapies evaluation recommend CIR-denied by insurance, now discharged to SNF,  Stroke team recommended TEE which was negative  and loop recorder for PAF - completed 6/4, lower extremity venous Dopplers negative, hypercoagulable panel and autoimmune workup pending and recommend DAPT with aspirin & Plavix for 3 months and then Plavix alone due to intracranial stenosis. Resultant right hemiparesis, strength continues to improve. Etiology felt to be embolic of unknown source. 2. Recent acute bronchitis: Completed 5 days of levofloxacin-discontinued. No steroids indicated. 3. Essential hypertension: Home antihypertensives on hold to allow for permissive hypertension due to acute stroke.  resume home medications at the time of discharge 4. Asthma/COPD: Stable without clinical bronchospasm. Continue Spiriva furoate/Vilanterol and when necessary bronchodilators. 5. Anxiety and  depression: Continue venlafaxine. 6. Hypokalemia: Replaced. 7. Leukocytosis: Unclear etiology.? Stress response. Resolved. 8. Fibromyalgia/chronic pain: States that she takes Vicodin 3/25, 1 tab 3 times a day, changed to same due to ongoing pain issues.   Discharge Exam:  Blood pressure (!) 160/81, pulse 67, temperature 98.8 F (37.1 C), temperature source Oral, resp. rate 20, height 4' 10"  (1.473 m), weight 81.2 kg (179 lb), SpO2 95 %. Cardiovascular system: S1 and S2 heard, RRR. No JVD, murmurs or pedal edema. Telemetry personally reviewed: Sinus rhythm and no arrhythmias noted . Gastrointestinal system: Abdomen is nondistended, soft and nontender. No organomegaly or masses felt. Normal bowel sounds heard. Central nervous system: Alert and oriented. No cranial nerve deficits appreciated. Extremities: Left limbs grade 5 x 5 power. Right limbs at least grade 4 x 5 power-right lower extremity seems weaker than upper extremity but also suspect that she is not providing adequate effort- seems to be ongoing issue. Right pronator drift +. Skin: No rashes, lesions or ulcers Psychiatry: Judgement and insight appear normal. Mood & affect: Pleasant and appropriate. Not anxious     Follow-up Information    Rosalin Hawking, MD. Schedule an appointment as soon as possible for a visit in 6 week(s).   Specialty:  Neurology Contact information: [address] [place] [address] [phone]           Signed: Reyne Dumas 10/02/2016, 8:36 AM        Time spent >1 hour

## 2016-10-02 NOTE — Progress Notes (Signed)
CSW consulted for possible SNF placement, as patient's insurance would not approve CIR placement. CSW explained SNF placement option to patient, and patient is not interested in SNF placement at this time. Patient requested home health services, and ordering of medical equipment to help her maximize independence at home. CSW alerted RNCM for home health and DME needs.  CSW no longer needed to facilitate discharge. CSW signing off.  Laveda Abbe LCSW [phone]

## 2016-10-02 NOTE — Progress Notes (Signed)
Physical Therapy Treatment Patient Details Name: Lynn Rollins MRN: [id number] DOB: 08-Jan-1966 Today's Date: 10/02/2016    History of Present Illness Lynn Rollins is a 51 y.o. female with medical history significant of HTN, depression, fibromyalgia, stroke. MRI brain positive for small left ACA territory ischemic infarctions.    PT Comments    Pt presented with improved functional mobility and activity tolerance today. Pt able to ambulate 450ft with RW and supervision for safety, and completed transfers mod I. Pt would benefit from continued skilled PT to improve activity tolerance, strength, and maximize independence with mobility to return to PLOF. Pt demonstrates improvements with mobility and safety awareness to d/c home. PT will continue to follow acutely.    Follow Up Recommendations  Home health PT     Equipment Recommendations  Rolling walker with 5" wheels (junior)    Recommendations for Other Services       Precautions / Restrictions Precautions Precautions: Fall Restrictions Weight Bearing Restrictions: No    Mobility  Bed Mobility               General bed mobility comments: Received in chair   Transfers Overall transfer level: Modified independent Equipment used: Rolling walker (2 wheeled) Transfers: Sit to/from Stand              Ambulation/Gait Ambulation/Gait assistance: Supervision Ambulation Distance (Feet): 400 Feet Assistive device: Rolling walker (2 wheeled) (junior) Gait Pattern/deviations: Step-through pattern;Decreased stride length Gait velocity: decreased Gait velocity interpretation: Below normal speed for age/gender General Gait Details: PT provided junior RW to improve posture with pt reporting improved comfort.    Stairs            Wheelchair Mobility    Modified Rankin (Stroke Patients Only) Modified Rankin (Stroke Patients Only) Pre-Morbid Rankin Score: Slight disability Modified Rankin: Moderate  disability     Balance Overall balance assessment: Needs assistance Sitting-balance support: No upper extremity supported;Feet supported Sitting balance-Leahy Scale: Good     Standing balance support: Bilateral upper extremity supported;During functional activity Standing balance-Leahy Scale: Poor Standing balance comment: reliant on RW in standing                            Cognition Arousal/Alertness: Awake/alert Behavior During Therapy: WFL for tasks assessed/performed Overall Cognitive Status: Within Functional Limits for tasks assessed                                        Exercises      General Comments        Pertinent Vitals/Pain Pain Assessment: Faces Faces Pain Scale: Hurts little more Pain Location: "everywhere" pt has fibromyalgia Pain Descriptors / Indicators: Aching;Sore Pain Intervention(s): Monitored during session    Home Living                      Prior Function            PT Goals (current goals can now be found in the care plan section) Acute Rehab PT Goals Patient Stated Goal: to go home PT Goal Formulation: With patient Time For Goal Achievement: 10/12/16 Potential to Achieve Goals: Good Progress towards PT goals: Progressing toward goals    Frequency    Min 3X/week      PT Plan Discharge plan needs to be updated    Co-evaluation  AM-PAC PT "6 Clicks" Daily Activity  Outcome Measure  Difficulty turning over in bed (including adjusting bedclothes, sheets and blankets)?: None Difficulty moving from lying on back to sitting on the side of the bed? : None Difficulty sitting down on and standing up from a chair with arms (e.g., wheelchair, bedside commode, etc,.)?: None Help needed moving to and from a bed to chair (including a wheelchair)?: A Little Help needed walking in hospital room?: A Little Help needed climbing 3-5 steps with a railing? : A Lot 6 Click Score: 20     End of Session Equipment Utilized During Treatment: Gait belt Activity Tolerance: Patient tolerated treatment well Patient left: in chair;with call bell/phone within reach Nurse Communication: Mobility status PT Visit Diagnosis: Unsteadiness on feet (R26.81);Other abnormalities of gait and mobility (R26.89)     Time: 6384-5364 PT Time Calculation (min) (ACUTE ONLY): 22 min  Charges:  $Gait Training: 8-22 mins                    G Codes:       Loma Sousa, SPT  [phone]   Loma Sousa 10/02/2016, 11:58 AM

## 2016-10-02 NOTE — Progress Notes (Signed)
Pt discharged at this time with family taking all personal belongings. IV discontinued, dry dressing applied. Discharge instructions provided with verbal understanding. Pt will follow up with Md per dc summary. No noted distress.

## 2016-10-03 DIAGNOSIS — I10 Essential (primary) hypertension: Secondary | ICD-10-CM | POA: Diagnosis not present

## 2016-10-03 DIAGNOSIS — J449 Chronic obstructive pulmonary disease, unspecified: Secondary | ICD-10-CM | POA: Diagnosis not present

## 2016-10-03 DIAGNOSIS — F329 Major depressive disorder, single episode, unspecified: Secondary | ICD-10-CM | POA: Diagnosis not present

## 2016-10-03 DIAGNOSIS — M797 Fibromyalgia: Secondary | ICD-10-CM | POA: Diagnosis not present

## 2016-10-03 DIAGNOSIS — J45909 Unspecified asthma, uncomplicated: Secondary | ICD-10-CM | POA: Diagnosis not present

## 2016-10-03 DIAGNOSIS — R262 Difficulty in walking, not elsewhere classified: Secondary | ICD-10-CM | POA: Diagnosis not present

## 2016-10-03 DIAGNOSIS — F419 Anxiety disorder, unspecified: Secondary | ICD-10-CM | POA: Diagnosis not present

## 2016-10-03 DIAGNOSIS — I69351 Hemiplegia and hemiparesis following cerebral infarction affecting right dominant side: Secondary | ICD-10-CM | POA: Diagnosis not present

## 2016-10-03 DIAGNOSIS — J209 Acute bronchitis, unspecified: Secondary | ICD-10-CM | POA: Diagnosis not present

## 2016-10-03 LAB — LUPUS ANTICOAGULANT PANEL
DRVVT: 34.6 s (ref 0.0–47.0)
PTT LA: 30.3 s (ref 0.0–51.9)

## 2016-10-05 DIAGNOSIS — F419 Anxiety disorder, unspecified: Secondary | ICD-10-CM | POA: Diagnosis not present

## 2016-10-05 DIAGNOSIS — I63522 Cerebral infarction due to unspecified occlusion or stenosis of left anterior cerebral artery: Secondary | ICD-10-CM | POA: Diagnosis not present

## 2016-10-05 DIAGNOSIS — J45909 Unspecified asthma, uncomplicated: Secondary | ICD-10-CM | POA: Diagnosis not present

## 2016-10-05 DIAGNOSIS — I10 Essential (primary) hypertension: Secondary | ICD-10-CM | POA: Diagnosis not present

## 2016-10-05 DIAGNOSIS — Z6838 Body mass index (BMI) 38.0-38.9, adult: Secondary | ICD-10-CM | POA: Diagnosis not present

## 2016-10-09 DIAGNOSIS — F329 Major depressive disorder, single episode, unspecified: Secondary | ICD-10-CM | POA: Diagnosis not present

## 2016-10-09 DIAGNOSIS — J209 Acute bronchitis, unspecified: Secondary | ICD-10-CM | POA: Diagnosis not present

## 2016-10-09 DIAGNOSIS — R262 Difficulty in walking, not elsewhere classified: Secondary | ICD-10-CM | POA: Diagnosis not present

## 2016-10-09 DIAGNOSIS — I69351 Hemiplegia and hemiparesis following cerebral infarction affecting right dominant side: Secondary | ICD-10-CM | POA: Diagnosis not present

## 2016-10-09 DIAGNOSIS — I10 Essential (primary) hypertension: Secondary | ICD-10-CM | POA: Diagnosis not present

## 2016-10-09 DIAGNOSIS — J449 Chronic obstructive pulmonary disease, unspecified: Secondary | ICD-10-CM | POA: Diagnosis not present

## 2016-10-09 DIAGNOSIS — J45909 Unspecified asthma, uncomplicated: Secondary | ICD-10-CM | POA: Diagnosis not present

## 2016-10-09 DIAGNOSIS — F419 Anxiety disorder, unspecified: Secondary | ICD-10-CM | POA: Diagnosis not present

## 2016-10-09 DIAGNOSIS — M797 Fibromyalgia: Secondary | ICD-10-CM | POA: Diagnosis not present

## 2016-10-11 DIAGNOSIS — F419 Anxiety disorder, unspecified: Secondary | ICD-10-CM | POA: Diagnosis not present

## 2016-10-11 DIAGNOSIS — M797 Fibromyalgia: Secondary | ICD-10-CM | POA: Diagnosis not present

## 2016-10-11 DIAGNOSIS — F329 Major depressive disorder, single episode, unspecified: Secondary | ICD-10-CM | POA: Diagnosis not present

## 2016-10-11 DIAGNOSIS — J449 Chronic obstructive pulmonary disease, unspecified: Secondary | ICD-10-CM | POA: Diagnosis not present

## 2016-10-11 DIAGNOSIS — I10 Essential (primary) hypertension: Secondary | ICD-10-CM | POA: Diagnosis not present

## 2016-10-11 DIAGNOSIS — I69351 Hemiplegia and hemiparesis following cerebral infarction affecting right dominant side: Secondary | ICD-10-CM | POA: Diagnosis not present

## 2016-10-11 DIAGNOSIS — J209 Acute bronchitis, unspecified: Secondary | ICD-10-CM | POA: Diagnosis not present

## 2016-10-11 DIAGNOSIS — J45909 Unspecified asthma, uncomplicated: Secondary | ICD-10-CM | POA: Diagnosis not present

## 2016-10-11 DIAGNOSIS — R262 Difficulty in walking, not elsewhere classified: Secondary | ICD-10-CM | POA: Diagnosis not present

## 2016-10-12 DIAGNOSIS — J209 Acute bronchitis, unspecified: Secondary | ICD-10-CM | POA: Diagnosis not present

## 2016-10-12 DIAGNOSIS — I69351 Hemiplegia and hemiparesis following cerebral infarction affecting right dominant side: Secondary | ICD-10-CM | POA: Diagnosis not present

## 2016-10-12 DIAGNOSIS — I10 Essential (primary) hypertension: Secondary | ICD-10-CM | POA: Diagnosis not present

## 2016-10-12 DIAGNOSIS — F329 Major depressive disorder, single episode, unspecified: Secondary | ICD-10-CM | POA: Diagnosis not present

## 2016-10-12 DIAGNOSIS — F419 Anxiety disorder, unspecified: Secondary | ICD-10-CM | POA: Diagnosis not present

## 2016-10-12 DIAGNOSIS — R262 Difficulty in walking, not elsewhere classified: Secondary | ICD-10-CM | POA: Diagnosis not present

## 2016-10-12 DIAGNOSIS — M797 Fibromyalgia: Secondary | ICD-10-CM | POA: Diagnosis not present

## 2016-10-12 DIAGNOSIS — J45909 Unspecified asthma, uncomplicated: Secondary | ICD-10-CM | POA: Diagnosis not present

## 2016-10-12 DIAGNOSIS — J449 Chronic obstructive pulmonary disease, unspecified: Secondary | ICD-10-CM | POA: Diagnosis not present

## 2016-10-15 DIAGNOSIS — J209 Acute bronchitis, unspecified: Secondary | ICD-10-CM | POA: Diagnosis not present

## 2016-10-15 DIAGNOSIS — I69351 Hemiplegia and hemiparesis following cerebral infarction affecting right dominant side: Secondary | ICD-10-CM | POA: Diagnosis not present

## 2016-10-15 DIAGNOSIS — F329 Major depressive disorder, single episode, unspecified: Secondary | ICD-10-CM | POA: Diagnosis not present

## 2016-10-15 DIAGNOSIS — J449 Chronic obstructive pulmonary disease, unspecified: Secondary | ICD-10-CM | POA: Diagnosis not present

## 2016-10-15 DIAGNOSIS — I10 Essential (primary) hypertension: Secondary | ICD-10-CM | POA: Diagnosis not present

## 2016-10-15 DIAGNOSIS — R262 Difficulty in walking, not elsewhere classified: Secondary | ICD-10-CM | POA: Diagnosis not present

## 2016-10-15 DIAGNOSIS — J45909 Unspecified asthma, uncomplicated: Secondary | ICD-10-CM | POA: Diagnosis not present

## 2016-10-15 DIAGNOSIS — F419 Anxiety disorder, unspecified: Secondary | ICD-10-CM | POA: Diagnosis not present

## 2016-10-15 DIAGNOSIS — M797 Fibromyalgia: Secondary | ICD-10-CM | POA: Diagnosis not present

## 2016-10-16 ENCOUNTER — Ambulatory Visit (INDEPENDENT_AMBULATORY_CARE_PROVIDER_SITE_OTHER): Payer: Medicare HMO | Admitting: *Deleted

## 2016-10-16 ENCOUNTER — Other Ambulatory Visit: Payer: Self-pay

## 2016-10-16 DIAGNOSIS — I639 Cerebral infarction, unspecified: Secondary | ICD-10-CM

## 2016-10-16 LAB — CUP PACEART INCLINIC DEVICE CHECK
Date Time Interrogation Session: 20180619102603
MDC IDC PG IMPLANT DT: 20180604

## 2016-10-16 NOTE — Progress Notes (Signed)
Wound check appointment. Steri-strips removed. Wound without redness or edema. Incision edges approximated, wound well healed. Battery status: Good. R-waves 0.32 mV. 0 symptom episodes, 0 tachy episodes, 0 pause episodes, 0 brady episodes. 0 AF episodes (0% burden). Patient educated about wound care (including signs and symptoms of infection). Monthly summary reports and ROV with SK PRN.

## 2016-10-16 NOTE — Patient Outreach (Signed)
[facility]) Care Management  10/16/2016  Lynn Rollins 29-May-1965 [id number]   EMMI: stroke Referral date: 10/16/16 Referral source: EMMI stroke red alert Referral reason:   Feeling worse overall: NO Day # 69,  Stroke after follow up EMMI stroke calls completed  Telephone call to patient regarding EMMI stroke red alert. HIPAA verified with patient. Discussed EMMI stroke program with patient. Patient states the automated service heard her wrong. Patient states she does not feel worse related to her recent stroke. Patient states she has fibromyalgia. Patient states she is following a protocol for managing her fibromyalgia. Patient states she will be going to a pain clinic in July 2018 for management.  Patient states she has followed up with her primary MD since being discharged from the hospital. Patient states her doctor took her off several medications that were causing her not to feel well. Patient reports she has an appointment scheduled with the neurologist for 12/17/16.  Patient states she had her wound check for her loop recorder.  Patient states she has [facility] providing PT, OT, ST, and nursing .  Patient states she still has weakness on the right side from the hip down. Patient states she also has some short term memory block.  Patient reports she has a strong social support.  States her mother makes sure she is taking her medications daily and her husband helps her. Patient states she can afford her medications.  Patient states she has had 5-6 mini strokes since 2011. States the doctors are unsure why she has them.  Patient states she expects she will have more testing once she sees the neurologist.  Patient states she monitors her blood pressure at home.  RNCM discussed signs/ symptoms of stroke with patient. Advised to call 911 for stroke like symptoms.  RNCM discussed with patient how to contact doctor after hours.  RNCM advised patient to notify MD of  any changes in condition prior to scheduled appointment. RNCM provided contact name and number: [phone] or main office number [phone] and 24 hour nurse advise line [phone].  RNCM verified patient aware of 911 services for urgent/ emergent needs.   PLAN: RNCM will refer patient to care management assistant to close due to patient being assessed and having no further needs.  RNCM will notify patients primary MD of closure.  Quinn Plowman RN,BSN,CCM [facility] Telephonic  [phone]

## 2016-10-16 NOTE — Patient Instructions (Signed)
Please call Device clinic for further questions or concerns regarding LINQ  [phone]

## 2016-10-17 DIAGNOSIS — J209 Acute bronchitis, unspecified: Secondary | ICD-10-CM | POA: Diagnosis not present

## 2016-10-17 DIAGNOSIS — J45909 Unspecified asthma, uncomplicated: Secondary | ICD-10-CM | POA: Diagnosis not present

## 2016-10-17 DIAGNOSIS — F329 Major depressive disorder, single episode, unspecified: Secondary | ICD-10-CM | POA: Diagnosis not present

## 2016-10-17 DIAGNOSIS — J449 Chronic obstructive pulmonary disease, unspecified: Secondary | ICD-10-CM | POA: Diagnosis not present

## 2016-10-17 DIAGNOSIS — F419 Anxiety disorder, unspecified: Secondary | ICD-10-CM | POA: Diagnosis not present

## 2016-10-17 DIAGNOSIS — I10 Essential (primary) hypertension: Secondary | ICD-10-CM | POA: Diagnosis not present

## 2016-10-17 DIAGNOSIS — M797 Fibromyalgia: Secondary | ICD-10-CM | POA: Diagnosis not present

## 2016-10-17 DIAGNOSIS — R262 Difficulty in walking, not elsewhere classified: Secondary | ICD-10-CM | POA: Diagnosis not present

## 2016-10-17 DIAGNOSIS — I69351 Hemiplegia and hemiparesis following cerebral infarction affecting right dominant side: Secondary | ICD-10-CM | POA: Diagnosis not present

## 2016-10-18 DIAGNOSIS — J209 Acute bronchitis, unspecified: Secondary | ICD-10-CM | POA: Diagnosis not present

## 2016-10-18 DIAGNOSIS — F419 Anxiety disorder, unspecified: Secondary | ICD-10-CM | POA: Diagnosis not present

## 2016-10-18 DIAGNOSIS — M797 Fibromyalgia: Secondary | ICD-10-CM | POA: Diagnosis not present

## 2016-10-18 DIAGNOSIS — J449 Chronic obstructive pulmonary disease, unspecified: Secondary | ICD-10-CM | POA: Diagnosis not present

## 2016-10-18 DIAGNOSIS — R262 Difficulty in walking, not elsewhere classified: Secondary | ICD-10-CM | POA: Diagnosis not present

## 2016-10-18 DIAGNOSIS — I10 Essential (primary) hypertension: Secondary | ICD-10-CM | POA: Diagnosis not present

## 2016-10-18 DIAGNOSIS — J45909 Unspecified asthma, uncomplicated: Secondary | ICD-10-CM | POA: Diagnosis not present

## 2016-10-18 DIAGNOSIS — I69351 Hemiplegia and hemiparesis following cerebral infarction affecting right dominant side: Secondary | ICD-10-CM | POA: Diagnosis not present

## 2016-10-18 DIAGNOSIS — F329 Major depressive disorder, single episode, unspecified: Secondary | ICD-10-CM | POA: Diagnosis not present

## 2016-10-19 DIAGNOSIS — F419 Anxiety disorder, unspecified: Secondary | ICD-10-CM | POA: Diagnosis not present

## 2016-10-19 DIAGNOSIS — I10 Essential (primary) hypertension: Secondary | ICD-10-CM | POA: Diagnosis not present

## 2016-10-19 DIAGNOSIS — J449 Chronic obstructive pulmonary disease, unspecified: Secondary | ICD-10-CM | POA: Diagnosis not present

## 2016-10-19 DIAGNOSIS — R262 Difficulty in walking, not elsewhere classified: Secondary | ICD-10-CM | POA: Diagnosis not present

## 2016-10-19 DIAGNOSIS — M797 Fibromyalgia: Secondary | ICD-10-CM | POA: Diagnosis not present

## 2016-10-19 DIAGNOSIS — J209 Acute bronchitis, unspecified: Secondary | ICD-10-CM | POA: Diagnosis not present

## 2016-10-19 DIAGNOSIS — I69351 Hemiplegia and hemiparesis following cerebral infarction affecting right dominant side: Secondary | ICD-10-CM | POA: Diagnosis not present

## 2016-10-19 DIAGNOSIS — F329 Major depressive disorder, single episode, unspecified: Secondary | ICD-10-CM | POA: Diagnosis not present

## 2016-10-19 DIAGNOSIS — J45909 Unspecified asthma, uncomplicated: Secondary | ICD-10-CM | POA: Diagnosis not present

## 2016-10-22 DIAGNOSIS — J209 Acute bronchitis, unspecified: Secondary | ICD-10-CM | POA: Diagnosis not present

## 2016-10-22 DIAGNOSIS — R262 Difficulty in walking, not elsewhere classified: Secondary | ICD-10-CM | POA: Diagnosis not present

## 2016-10-22 DIAGNOSIS — F329 Major depressive disorder, single episode, unspecified: Secondary | ICD-10-CM | POA: Diagnosis not present

## 2016-10-22 DIAGNOSIS — F419 Anxiety disorder, unspecified: Secondary | ICD-10-CM | POA: Diagnosis not present

## 2016-10-22 DIAGNOSIS — M797 Fibromyalgia: Secondary | ICD-10-CM | POA: Diagnosis not present

## 2016-10-22 DIAGNOSIS — J449 Chronic obstructive pulmonary disease, unspecified: Secondary | ICD-10-CM | POA: Diagnosis not present

## 2016-10-22 DIAGNOSIS — J45909 Unspecified asthma, uncomplicated: Secondary | ICD-10-CM | POA: Diagnosis not present

## 2016-10-22 DIAGNOSIS — I10 Essential (primary) hypertension: Secondary | ICD-10-CM | POA: Diagnosis not present

## 2016-10-22 DIAGNOSIS — I69351 Hemiplegia and hemiparesis following cerebral infarction affecting right dominant side: Secondary | ICD-10-CM | POA: Diagnosis not present

## 2016-10-24 DIAGNOSIS — I69351 Hemiplegia and hemiparesis following cerebral infarction affecting right dominant side: Secondary | ICD-10-CM | POA: Diagnosis not present

## 2016-10-24 DIAGNOSIS — M797 Fibromyalgia: Secondary | ICD-10-CM | POA: Diagnosis not present

## 2016-10-24 DIAGNOSIS — J449 Chronic obstructive pulmonary disease, unspecified: Secondary | ICD-10-CM | POA: Diagnosis not present

## 2016-10-24 DIAGNOSIS — R262 Difficulty in walking, not elsewhere classified: Secondary | ICD-10-CM | POA: Diagnosis not present

## 2016-10-24 DIAGNOSIS — J45909 Unspecified asthma, uncomplicated: Secondary | ICD-10-CM | POA: Diagnosis not present

## 2016-10-24 DIAGNOSIS — F329 Major depressive disorder, single episode, unspecified: Secondary | ICD-10-CM | POA: Diagnosis not present

## 2016-10-24 DIAGNOSIS — I10 Essential (primary) hypertension: Secondary | ICD-10-CM | POA: Diagnosis not present

## 2016-10-24 DIAGNOSIS — J209 Acute bronchitis, unspecified: Secondary | ICD-10-CM | POA: Diagnosis not present

## 2016-10-24 DIAGNOSIS — F419 Anxiety disorder, unspecified: Secondary | ICD-10-CM | POA: Diagnosis not present

## 2016-10-25 DIAGNOSIS — I69351 Hemiplegia and hemiparesis following cerebral infarction affecting right dominant side: Secondary | ICD-10-CM | POA: Diagnosis not present

## 2016-10-25 DIAGNOSIS — F419 Anxiety disorder, unspecified: Secondary | ICD-10-CM | POA: Diagnosis not present

## 2016-10-25 DIAGNOSIS — I10 Essential (primary) hypertension: Secondary | ICD-10-CM | POA: Diagnosis not present

## 2016-10-25 DIAGNOSIS — J209 Acute bronchitis, unspecified: Secondary | ICD-10-CM | POA: Diagnosis not present

## 2016-10-25 DIAGNOSIS — J45909 Unspecified asthma, uncomplicated: Secondary | ICD-10-CM | POA: Diagnosis not present

## 2016-10-25 DIAGNOSIS — J449 Chronic obstructive pulmonary disease, unspecified: Secondary | ICD-10-CM | POA: Diagnosis not present

## 2016-10-25 DIAGNOSIS — F329 Major depressive disorder, single episode, unspecified: Secondary | ICD-10-CM | POA: Diagnosis not present

## 2016-10-25 DIAGNOSIS — R262 Difficulty in walking, not elsewhere classified: Secondary | ICD-10-CM | POA: Diagnosis not present

## 2016-10-25 DIAGNOSIS — M797 Fibromyalgia: Secondary | ICD-10-CM | POA: Diagnosis not present

## 2016-11-01 ENCOUNTER — Ambulatory Visit (INDEPENDENT_AMBULATORY_CARE_PROVIDER_SITE_OTHER): Payer: Medicare HMO | Admitting: *Deleted

## 2016-11-01 DIAGNOSIS — I639 Cerebral infarction, unspecified: Secondary | ICD-10-CM | POA: Diagnosis not present

## 2016-11-02 NOTE — Progress Notes (Signed)
Carelink Summary Report / Loop Recorder 

## 2016-11-05 LAB — CUP PACEART REMOTE DEVICE CHECK
Date Time Interrogation Session: 20180704213613
Implantable Pulse Generator Implant Date: 20180604

## 2016-11-07 DIAGNOSIS — I679 Cerebrovascular disease, unspecified: Secondary | ICD-10-CM | POA: Diagnosis not present

## 2016-11-07 DIAGNOSIS — G8929 Other chronic pain: Secondary | ICD-10-CM | POA: Diagnosis not present

## 2016-11-07 DIAGNOSIS — I69153 Hemiplegia and hemiparesis following nontraumatic intracerebral hemorrhage affecting right non-dominant side: Secondary | ICD-10-CM | POA: Diagnosis not present

## 2016-11-07 DIAGNOSIS — M5412 Radiculopathy, cervical region: Secondary | ICD-10-CM | POA: Diagnosis not present

## 2016-11-07 DIAGNOSIS — F1120 Opioid dependence, uncomplicated: Secondary | ICD-10-CM | POA: Diagnosis not present

## 2016-11-07 DIAGNOSIS — Z79899 Other long term (current) drug therapy: Secondary | ICD-10-CM | POA: Diagnosis not present

## 2016-11-07 DIAGNOSIS — I10 Essential (primary) hypertension: Secondary | ICD-10-CM | POA: Diagnosis not present

## 2016-11-07 DIAGNOSIS — M542 Cervicalgia: Secondary | ICD-10-CM | POA: Diagnosis not present

## 2016-11-07 DIAGNOSIS — G894 Chronic pain syndrome: Secondary | ICD-10-CM | POA: Diagnosis not present

## 2016-11-16 DIAGNOSIS — J449 Chronic obstructive pulmonary disease, unspecified: Secondary | ICD-10-CM | POA: Diagnosis not present

## 2016-11-16 DIAGNOSIS — I10 Essential (primary) hypertension: Secondary | ICD-10-CM | POA: Diagnosis not present

## 2016-11-16 DIAGNOSIS — I63522 Cerebral infarction due to unspecified occlusion or stenosis of left anterior cerebral artery: Secondary | ICD-10-CM | POA: Diagnosis not present

## 2016-11-16 DIAGNOSIS — F419 Anxiety disorder, unspecified: Secondary | ICD-10-CM | POA: Diagnosis not present

## 2016-11-16 DIAGNOSIS — G894 Chronic pain syndrome: Secondary | ICD-10-CM | POA: Diagnosis not present

## 2016-11-21 DIAGNOSIS — I69153 Hemiplegia and hemiparesis following nontraumatic intracerebral hemorrhage affecting right non-dominant side: Secondary | ICD-10-CM | POA: Diagnosis not present

## 2016-11-21 DIAGNOSIS — M542 Cervicalgia: Secondary | ICD-10-CM | POA: Diagnosis not present

## 2016-11-21 DIAGNOSIS — I10 Essential (primary) hypertension: Secondary | ICD-10-CM | POA: Diagnosis not present

## 2016-11-21 DIAGNOSIS — G8929 Other chronic pain: Secondary | ICD-10-CM | POA: Diagnosis not present

## 2016-11-21 DIAGNOSIS — Z79899 Other long term (current) drug therapy: Secondary | ICD-10-CM | POA: Diagnosis not present

## 2016-11-21 DIAGNOSIS — F1120 Opioid dependence, uncomplicated: Secondary | ICD-10-CM | POA: Diagnosis not present

## 2016-11-21 DIAGNOSIS — I679 Cerebrovascular disease, unspecified: Secondary | ICD-10-CM | POA: Diagnosis not present

## 2016-11-21 DIAGNOSIS — M5412 Radiculopathy, cervical region: Secondary | ICD-10-CM | POA: Diagnosis not present

## 2016-11-21 DIAGNOSIS — G894 Chronic pain syndrome: Secondary | ICD-10-CM | POA: Diagnosis not present

## 2016-11-26 ENCOUNTER — Encounter: Payer: Self-pay | Admitting: Diagnostic Neuroimaging

## 2016-11-26 ENCOUNTER — Ambulatory Visit (INDEPENDENT_AMBULATORY_CARE_PROVIDER_SITE_OTHER): Payer: Medicare HMO | Admitting: Diagnostic Neuroimaging

## 2016-11-26 VITALS — BP 128/81 | HR 77 | Ht <= 58 in | Wt 192.0 lb

## 2016-11-26 DIAGNOSIS — I63422 Cerebral infarction due to embolism of left anterior cerebral artery: Secondary | ICD-10-CM

## 2016-11-26 DIAGNOSIS — I10 Essential (primary) hypertension: Secondary | ICD-10-CM | POA: Diagnosis not present

## 2016-11-26 NOTE — Progress Notes (Addendum)
[facility]  PATIENT: Lynn Rollins DOB: [birth year]  REFERRING CLINICIAN: Reyne Dumas HISTORY FROM: patient  REASON FOR VISIT: new consult    HISTORICAL  CHIEF COMPLAINT:  Chief Complaint  Patient presents with  . Hospitalization Follow-up  . Cerebrovascular Accident Left    R sided weakness, using cane.  Was getting PT, then due to back pain, went to pain clinic,  has not had therapy since.   Husband - Antony Haste.      HISTORY OF PRESENT ILLNESS:   51 year old female with hypertension, depression, fibromyalgia, presented to hospital on 09/27/16 for acute right-sided weakness. IV TPA was not given due to being out of window. In the vascular procedure was considered but patient and husband deferred. Patient was admitted for evaluation. Patient was found to have acute left ACA infarcts. Patient also had chronic infarcts in bilateral basal ganglia, left temporal lobe and left cerebellar peduncle. Patient had hypercoagulable and autoimmune workup which was negative. Patient was discharged on dual antiplatelet therapy. Patient had implanted loop recorder placed.  Since that time patient feels stable. She continues to have depression, anxiety, memory loss, fatigue. She has chronic pain.  In retrospect 10 days prior to hospital admission patient felt double vision transiently. She also had brief passing out spell.    REVIEW OF SYSTEMS: Full 14 system review of systems performed and negative with exception of: As per history of present illness.  ALLERGIES: Allergies  Allergen Reactions  . Statins Other (See Comments)    Patient cannot recall reaction  . Tape Other (See Comments)    Patient has Fibromyalgia and cannot tolerate medical tape and it's removal    HOME MEDICATIONS: Outpatient Medications Prior to Visit  Medication Sig Dispense Refill  . albuterol (PROVENTIL HFA;VENTOLIN HFA) 108 (90 BASE) MCG/ACT inhaler Inhale 2 puffs into the lungs every 4 (four)  hours as needed for wheezing.     Marland Kitchen amLODipine (NORVASC) 10 MG tablet Take 10 mg by mouth daily.    Marland Kitchen aspirin EC 325 MG EC tablet Take 1 tablet (325 mg total) by mouth daily. 30 tablet 0  . BREO ELLIPTA 200-25 MCG/INH AEPB Inhale 1 puff into the lungs daily.  3  . cloNIDine (CATAPRES) 0.1 MG tablet Take 0.1 mg by mouth 3 (three) times daily.    . clopidogrel (PLAVIX) 75 MG tablet Take 75 mg by mouth daily.    Marland Kitchen DEXILANT 60 MG capsule Take 60 mg by mouth daily.  4  . diazepam (VALIUM) 5 MG tablet Take 0.5 tablets (2.5 mg total) by mouth 3 (three) times daily as needed for anxiety. 30 tablet 0  . dicyclomine (BENTYL) 20 MG tablet Take 20 mg by mouth every 6 (six) hours as needed for spasms.     . furosemide (LASIX) 40 MG tablet Take 40 mg by mouth 2 (two) times daily.  12  . labetalol (NORMODYNE) 300 MG tablet Take 300 mg by mouth 2 (two) times daily.  3  . lisinopril (PRINIVIL,ZESTRIL) 40 MG tablet Take 40 mg by mouth daily.    . mupirocin ointment (BACTROBAN) 2 % Apply 1 application topically 3 (three) times daily.    . orphenadrine (NORFLEX) 100 MG tablet Take 100 mg by mouth 2 (two) times daily as needed for muscle spasms.     . ranitidine (ZANTAC) 300 MG tablet Take 300 mg by mouth at bedtime.  3  . SPIRIVA RESPIMAT 1.25 MCG/ACT AERS Inhale 1 puff into the lungs 2 (two) times daily.  3  . temazepam (RESTORIL) 30 MG capsule Take 1 capsule (30 mg total) by mouth at bedtime. 30 capsule 5  . Vitamin D, Ergocalciferol, (DRISDOL) 50000 units CAPS capsule Take 50,000 Units by mouth once a week.  12  . predniSONE (DELTASONE) 10 MG tablet See admin instructions. TAKE 4 TABS X3 DAYS THEN 3 TABS X3 DAYS THEN 2 TABS X3 DAYS THEN 1 TAB X3 DAYS THEN STOP  3  . citalopram (CELEXA) 20 MG tablet Take 20 mg by mouth daily as needed (for mood).     Marland Kitchen HYDROcodone-acetaminophen (NORCO/VICODIN) 5-325 MG tablet Take 1 tablet by mouth 3 (three) times daily. (Patient not taking: Reported on 11/26/2016) 10 tablet 0  .  promethazine (PHENERGAN) 25 MG tablet Take 25 mg by mouth every 6 (six) hours as needed for nausea.     Marland Kitchen venlafaxine (EFFEXOR) 75 MG tablet Take 75 mg by mouth daily.  12   No facility-administered medications prior to visit.     PAST MEDICAL HISTORY: Past Medical History:  Diagnosis Date  . Anxiety   . Asthma   . Brain lesion 2011-2012   MRI  . Chronic fatigue   . COPD (chronic obstructive pulmonary disease) ([place])   . Depression   . Fibromyalgia   . Hypertension   . Stroke [facility])     PAST SURGICAL HISTORY: Past Surgical History:  Procedure Laterality Date  . ABDOMINAL HYSTERECTOMY    . CHOLECYSTECTOMY    . LOOP RECORDER INSERTION N/A 10/01/2016   Procedure: Loop Recorder Insertion;  Surgeon: Deboraha Sprang, MD;  Location: [place] [facility] LAB;  Service: Cardiovascular;  Laterality: N/A;  . TEE WITHOUT CARDIOVERSION N/A 10/01/2016   Procedure: TRANSESOPHAGEAL ECHOCARDIOGRAM (TEE);  Surgeon: Acie Fredrickson Wonda Cheng, MD;  Location[facility] ENDOSCOPY;  Service: Cardiovascular;  Laterality: N/A;  . TUBAL LIGATION      FAMILY HISTORY: Family History  Problem Relation Age of Onset  . Stroke Mother   . Parkinson's disease Father   . Heart disease Father   . Stroke Father   . Multiple sclerosis Brother     SOCIAL HISTORY:  Social History   Social History  . Marital status: Married    Spouse name: N/A  . Number of children: N/A  . Years of education: N/A   Occupational History  . Not on file.   Social History Main Topics  . Smoking status: Never Smoker  . Smokeless tobacco: Never Used  . Alcohol use No  . Drug use: No  . Sexual activity: Not on file   Other Topics Concern  . Not on file   Social History Narrative   Lives at home with Antony Haste, husband.  She is disabled.  Education graduate.  2 children.  Caffeine 2 drinks per day.  (most).     PHYSICAL EXAM  GENERAL EXAM/CONSTITUTIONAL: Vitals:  Vitals:   11/26/16 0846  BP: 128/81  Pulse: 77  Weight: 192 lb (87.1 kg)    Height: 4\' 10"  (1.473 m)     Body mass index is 40.13 kg/m.  Visual Acuity Screening   Right eye Left eye Both eyes  Without correction:     With correction: 20/30 20/40      Patient is in no distress; well developed, nourished and groomed; neck is supple  CARDIOVASCULAR:  Examination of carotid arteries is normal; no carotid bruits  Regular rate and rhythm, no murmurs  Examination of peripheral vascular system by observation and palpation is normal  EYES:  Ophthalmoscopic  exam of optic discs and posterior segments is normal; no papilledema or hemorrhages  MUSCULOSKELETAL:  Gait, strength, tone, movements noted in Neurologic exam below  NEUROLOGIC: MENTAL STATUS:  No flowsheet data found.  awake, alert, oriented to person, place and time  recent and remote memory intact  normal attention and concentration  language fluent, comprehension intact, naming intact,   fund of knowledge appropriate  CRANIAL NERVE:   2nd - no papilledema on fundoscopic exam  2nd, 3rd, 4th, 6th - pupils equal and reactive to light, visual fields full to confrontation, extraocular muscles intact, no nystagmus  5th - facial sensation symmetric  7th - facial strength symmetric  8th - hearing intact  9th - palate elevates symmetrically, uvula midline  11th - shoulder shrug symmetric  12th - tongue protrusion midline  MOTOR:   normal bulk and tone  RUE 4  RLE 4  LUE AND LLE 5  SENSORY:   normal and symmetric to light touch  COORDINATION:   finger-nose-finger, fine finger movements normal  REFLEXES:   deep tendon reflexes present and symmetric  GAIT/STATION:   narrow based gait; USING SINGLE POINT CANE    DIAGNOSTIC DATA (LABS, IMAGING, TESTING) - I reviewed patient records, labs, notes, testing and imaging myself where available.  Lab Results  Component Value Date   WBC 10.6 (H) 09/29/2016   HGB 14.5 09/29/2016   HCT 44.8 09/29/2016   MCV 92.2  09/29/2016   PLT 307 09/29/2016      Component Value Date/Time   NA 137 10/01/2016 0532   K 4.0 10/01/2016 0532   CL 105 10/01/2016 0532   CO2 25 10/01/2016 0532   GLUCOSE 113 (H) 10/01/2016 0532   BUN 15 10/01/2016 0532   CREATININE 0.72 10/01/2016 0532   CALCIUM 9.4 10/01/2016 0532   PROT 6.9 09/27/2016 1611   ALBUMIN 3.8 09/27/2016 1611   AST 28 09/27/2016 1611   ALT 35 09/27/2016 1611   ALKPHOS 86 09/27/2016 1611   BILITOT 0.6 09/27/2016 1611   GFRNONAA >60 10/01/2016 0532   GFRAA >60 10/01/2016 0532   Lab Results  Component Value Date   CHOL 145 09/28/2016   HDL 46 09/28/2016   LDLCALC 48 09/28/2016   TRIG 256 (H) 09/28/2016   CHOLHDL 3.2 09/28/2016   Lab Results  Component Value Date   HGBA1C 5.7 (H) 09/28/2016   No results found for: VITAMINB12 No results found for: TSH   09/27/16 MRI brain / MRA head  1. Acute left ACA territory infarcts, primarily the distal territory at the right lower extremity representation area. No associated hemorrhage or mass effect. 2. Associated left A2 occlusion. Otherwise negative intracranial MRA. 3. Bilateral basal ganglia lacunar infarcts have occurred since 2012. Chronic signal abnormality in the posterior left temporal lobe now favored to be post ischemic encephalomalacia, and there is a new similar area of encephalomalacia in the left cerebellar peduncle.  4. Given the above, consider a cardiac or paradoxical embolic source such as patent foraminal ovale.   09/29/16 carotid u/s - Findings are consistent with a 1-39 percent stenosis involving the right internal carotid artery and the left internal carotid artery. The vertebral arteries demonstrate antegrade flow.  10/01/16 BLE u/s - No evidence of deep vein thrombosis involving the right lower   extremity and left lower extremity. - No evidence of Baker&'s cyst on the right or left.  09/29/16 TTE  - Left ventricle: The cavity size was normal. Wall thickness was   normal. Systolic  function was normal. The estimated ejection   fraction was in the range of 60% to 65%. Wall motion was normal;   there were no regional wall motion abnormalities. Left   ventricular diastolic function parameters were normal.  10/01/16 TEE - Left ventricle: The cavity size was normal. Wall thickness was   normal. Systolic function was normal. The estimated ejection   fraction was in the range of 60% to 65%. - Aortic valve: No evidence of vegetation. - Left atrium: No evidence of thrombus in the atrial cavity or   appendage. - Atrial septum: No defect or patent foramen ovale was identified.  10/01/16 Hypercoag panel - negative    ASSESSMENT AND PLAN  51 y.o. year old female here with multiple anterior and posterior circulation ischemic infarcts, with recent left ACA infarct leading to hospital admission on 09/27/2016. Signs symptoms and testing results suspicious for embolic infarcts, but no source found.   Dx: embolic strokes unknown source  1. Cerebral infarction due to embolism of left anterior cerebral artery ([place])   2. Essential hypertension      PLAN: - continue aspirin + plavix x 3 months post stroke, then stop aspirin in Sept 1, 2018 - advised to improve nutrition, exercise, sleep and stress mgmt - continue CPAP for OSA - continue loop recorder monitoring  - follow up with Cecille Rubin, NP in 6 months, then may follow up as needed  Return in about 6 months (around 05/29/2017) for with Cecille Rubin, NP.    Penni Bombard, MD 01/26/5746, 3:40 AM Certified in Neurology, Neurophysiology and Neuroimaging  [facility] [address] [place], [place] [address] [phone]

## 2016-11-26 NOTE — Patient Instructions (Addendum)
Thank you for coming to see [place] at [facility]. I hope we have been able to provide you high quality care today.  You may receive a patient satisfaction survey over the next few weeks. We would appreciate your feedback and comments so that we may continue to improve ourselves and the health of our patients.  - continue aspirin + plavix x 3 months post stroke, then stop aspirin in Sept 1, 2018  - advised to improve nutrition, exercise, sleep and stress mgmt  - continue CPAP for OSA  - continue loop recorder monitoring   - follow up with stroke clinic Cecille Rubin, NP in 6 months), then may follow up as needed   ~~~~~~~~~~~~~~~~~~~~~~~~~~~~~~~~~~~~~~~~~~~~~~~~~~~~~~~~~~~~~~~~~  DR. Jontavious Commons'S GUIDE TO HAPPY AND HEALTHY LIVING These are some of my general health and wellness recommendations. Some of them may apply to you better than others. Please use common sense as you try these suggestions and feel free to ask me any questions.   ACTIVITY/FITNESS Mental, social, emotional and physical stimulation are very important for brain and body health. Try learning a new activity (arts, music, language, sports, games).  Keep moving your body to the best of your abilities. You can do this at home, inside or outside, the park, community center, gym or anywhere you like. Consider a physical therapist or personal trainer to get started. Consider the app Sworkit. Fitness trackers such as smart-watches, smart-phones or Fitbits can help as well.   NUTRITION Eat more plants: colorful vegetables, nuts, seeds and berries.  Eat less sugar, salt, preservatives and processed foods.  Avoid toxins such as cigarettes and alcohol.  Drink water when you are thirsty. Warm water with a slice of lemon is an excellent morning drink to start the day.  Consider these websites for more information The Nutrition Source (https://www.henry-hernandez.biz/) [employer] Nutrition  (WindowBlog.ch)   RELAXATION Consider practicing mindfulness meditation or other relaxation techniques such as deep breathing, prayer, yoga, tai chi, massage. See website mindful.org or the apps Headspace or Calm to help get started.   SLEEP Try to get at least 7-8+ hours sleep per day. Regular exercise and reduced caffeine will help you sleep better. Practice good sleep hygeine techniques. See website sleep.org for more information.   PLANNING Prepare estate planning, living will, healthcare POA documents. Sometimes this is best planned with the help of an attorney. Theconversationproject.org and agingwithdignity.org are excellent resources.

## 2016-11-30 ENCOUNTER — Ambulatory Visit (INDEPENDENT_AMBULATORY_CARE_PROVIDER_SITE_OTHER): Payer: Medicare HMO | Admitting: *Deleted

## 2016-11-30 DIAGNOSIS — I639 Cerebral infarction, unspecified: Secondary | ICD-10-CM

## 2016-12-04 NOTE — Progress Notes (Signed)
Carelink Summary Report / Loop Recorder 

## 2016-12-06 ENCOUNTER — Telehealth: Payer: Self-pay | Admitting: Cardiology

## 2016-12-06 NOTE — Telephone Encounter (Signed)
Spoke w/ pt and requested that she send a manual transmission b/c her home monitor has not updated in at least 7 days.   

## 2016-12-11 LAB — CUP PACEART REMOTE DEVICE CHECK
Implantable Pulse Generator Implant Date: 20180604
MDC IDC SESS DTM: 20180803220607

## 2016-12-17 ENCOUNTER — Ambulatory Visit: Payer: Self-pay | Admitting: Neurology

## 2016-12-19 DIAGNOSIS — F1120 Opioid dependence, uncomplicated: Secondary | ICD-10-CM | POA: Diagnosis not present

## 2016-12-19 DIAGNOSIS — I69153 Hemiplegia and hemiparesis following nontraumatic intracerebral hemorrhage affecting right non-dominant side: Secondary | ICD-10-CM | POA: Diagnosis not present

## 2016-12-19 DIAGNOSIS — I679 Cerebrovascular disease, unspecified: Secondary | ICD-10-CM | POA: Diagnosis not present

## 2016-12-19 DIAGNOSIS — G894 Chronic pain syndrome: Secondary | ICD-10-CM | POA: Diagnosis not present

## 2016-12-19 DIAGNOSIS — G8929 Other chronic pain: Secondary | ICD-10-CM | POA: Diagnosis not present

## 2016-12-19 DIAGNOSIS — M5412 Radiculopathy, cervical region: Secondary | ICD-10-CM | POA: Diagnosis not present

## 2016-12-19 DIAGNOSIS — M542 Cervicalgia: Secondary | ICD-10-CM | POA: Diagnosis not present

## 2016-12-19 DIAGNOSIS — I10 Essential (primary) hypertension: Secondary | ICD-10-CM | POA: Diagnosis not present

## 2016-12-20 DIAGNOSIS — Z0000 Encounter for general adult medical examination without abnormal findings: Secondary | ICD-10-CM | POA: Diagnosis not present

## 2016-12-20 DIAGNOSIS — E785 Hyperlipidemia, unspecified: Secondary | ICD-10-CM | POA: Diagnosis not present

## 2016-12-20 DIAGNOSIS — Z9181 History of falling: Secondary | ICD-10-CM | POA: Diagnosis not present

## 2016-12-20 DIAGNOSIS — E669 Obesity, unspecified: Secondary | ICD-10-CM | POA: Diagnosis not present

## 2016-12-20 DIAGNOSIS — Z6838 Body mass index (BMI) 38.0-38.9, adult: Secondary | ICD-10-CM | POA: Diagnosis not present

## 2016-12-20 DIAGNOSIS — Z136 Encounter for screening for cardiovascular disorders: Secondary | ICD-10-CM | POA: Diagnosis not present

## 2016-12-20 DIAGNOSIS — Z1389 Encounter for screening for other disorder: Secondary | ICD-10-CM | POA: Diagnosis not present

## 2016-12-20 DIAGNOSIS — N959 Unspecified menopausal and perimenopausal disorder: Secondary | ICD-10-CM | POA: Diagnosis not present

## 2016-12-20 DIAGNOSIS — Z1231 Encounter for screening mammogram for malignant neoplasm of breast: Secondary | ICD-10-CM | POA: Diagnosis not present

## 2016-12-26 DIAGNOSIS — I679 Cerebrovascular disease, unspecified: Secondary | ICD-10-CM | POA: Diagnosis not present

## 2016-12-26 DIAGNOSIS — M5412 Radiculopathy, cervical region: Secondary | ICD-10-CM | POA: Diagnosis not present

## 2016-12-26 DIAGNOSIS — G8929 Other chronic pain: Secondary | ICD-10-CM | POA: Diagnosis not present

## 2016-12-26 DIAGNOSIS — Z79899 Other long term (current) drug therapy: Secondary | ICD-10-CM | POA: Diagnosis not present

## 2016-12-26 DIAGNOSIS — I69153 Hemiplegia and hemiparesis following nontraumatic intracerebral hemorrhage affecting right non-dominant side: Secondary | ICD-10-CM | POA: Diagnosis not present

## 2016-12-26 DIAGNOSIS — I10 Essential (primary) hypertension: Secondary | ICD-10-CM | POA: Diagnosis not present

## 2016-12-26 DIAGNOSIS — G894 Chronic pain syndrome: Secondary | ICD-10-CM | POA: Diagnosis not present

## 2016-12-26 DIAGNOSIS — F1120 Opioid dependence, uncomplicated: Secondary | ICD-10-CM | POA: Diagnosis not present

## 2016-12-26 DIAGNOSIS — M542 Cervicalgia: Secondary | ICD-10-CM | POA: Diagnosis not present

## 2016-12-28 DIAGNOSIS — I10 Essential (primary) hypertension: Secondary | ICD-10-CM | POA: Diagnosis not present

## 2016-12-28 DIAGNOSIS — Z6838 Body mass index (BMI) 38.0-38.9, adult: Secondary | ICD-10-CM | POA: Diagnosis not present

## 2016-12-28 DIAGNOSIS — R7301 Impaired fasting glucose: Secondary | ICD-10-CM | POA: Diagnosis not present

## 2016-12-28 DIAGNOSIS — F419 Anxiety disorder, unspecified: Secondary | ICD-10-CM | POA: Diagnosis not present

## 2016-12-28 DIAGNOSIS — J449 Chronic obstructive pulmonary disease, unspecified: Secondary | ICD-10-CM | POA: Diagnosis not present

## 2016-12-28 DIAGNOSIS — I63522 Cerebral infarction due to unspecified occlusion or stenosis of left anterior cerebral artery: Secondary | ICD-10-CM | POA: Diagnosis not present

## 2016-12-28 DIAGNOSIS — E785 Hyperlipidemia, unspecified: Secondary | ICD-10-CM | POA: Diagnosis not present

## 2017-01-01 ENCOUNTER — Ambulatory Visit (INDEPENDENT_AMBULATORY_CARE_PROVIDER_SITE_OTHER): Payer: Medicare HMO | Admitting: *Deleted

## 2017-01-01 DIAGNOSIS — I639 Cerebral infarction, unspecified: Secondary | ICD-10-CM | POA: Diagnosis not present

## 2017-01-02 NOTE — Progress Notes (Signed)
Carelink Summary Report / Loop Recorder 

## 2017-01-06 LAB — CUP PACEART REMOTE DEVICE CHECK
Date Time Interrogation Session: 20180902223554
MDC IDC PG IMPLANT DT: 20180604

## 2017-01-23 DIAGNOSIS — G894 Chronic pain syndrome: Secondary | ICD-10-CM | POA: Diagnosis not present

## 2017-01-23 DIAGNOSIS — I10 Essential (primary) hypertension: Secondary | ICD-10-CM | POA: Diagnosis not present

## 2017-01-23 DIAGNOSIS — M5412 Radiculopathy, cervical region: Secondary | ICD-10-CM | POA: Diagnosis not present

## 2017-01-23 DIAGNOSIS — I69153 Hemiplegia and hemiparesis following nontraumatic intracerebral hemorrhage affecting right non-dominant side: Secondary | ICD-10-CM | POA: Diagnosis not present

## 2017-01-23 DIAGNOSIS — M542 Cervicalgia: Secondary | ICD-10-CM | POA: Diagnosis not present

## 2017-01-23 DIAGNOSIS — G8929 Other chronic pain: Secondary | ICD-10-CM | POA: Diagnosis not present

## 2017-01-23 DIAGNOSIS — F1120 Opioid dependence, uncomplicated: Secondary | ICD-10-CM | POA: Diagnosis not present

## 2017-01-23 DIAGNOSIS — I679 Cerebrovascular disease, unspecified: Secondary | ICD-10-CM | POA: Diagnosis not present

## 2017-01-23 DIAGNOSIS — Z79899 Other long term (current) drug therapy: Secondary | ICD-10-CM | POA: Diagnosis not present

## 2017-01-29 ENCOUNTER — Ambulatory Visit (INDEPENDENT_AMBULATORY_CARE_PROVIDER_SITE_OTHER): Payer: Medicare HMO | Admitting: *Deleted

## 2017-01-29 DIAGNOSIS — I639 Cerebral infarction, unspecified: Secondary | ICD-10-CM

## 2017-01-30 ENCOUNTER — Telehealth: Payer: Self-pay | Admitting: Cardiology

## 2017-01-30 NOTE — Progress Notes (Signed)
Carelink Summary Report / Loop Recorder 

## 2017-01-30 NOTE — Telephone Encounter (Signed)
LMOVM requesting that pt send manual transmission b/c home monitor has not updated in at least 14 days.    

## 2017-01-31 LAB — CUP PACEART REMOTE DEVICE CHECK
Date Time Interrogation Session: 20181002223641
MDC IDC PG IMPLANT DT: 20180604

## 2017-02-27 DIAGNOSIS — I10 Essential (primary) hypertension: Secondary | ICD-10-CM | POA: Diagnosis not present

## 2017-02-27 DIAGNOSIS — F1120 Opioid dependence, uncomplicated: Secondary | ICD-10-CM | POA: Diagnosis not present

## 2017-02-27 DIAGNOSIS — I679 Cerebrovascular disease, unspecified: Secondary | ICD-10-CM | POA: Diagnosis not present

## 2017-02-27 DIAGNOSIS — I69153 Hemiplegia and hemiparesis following nontraumatic intracerebral hemorrhage affecting right non-dominant side: Secondary | ICD-10-CM | POA: Diagnosis not present

## 2017-02-27 DIAGNOSIS — G894 Chronic pain syndrome: Secondary | ICD-10-CM | POA: Diagnosis not present

## 2017-02-27 DIAGNOSIS — M5412 Radiculopathy, cervical region: Secondary | ICD-10-CM | POA: Diagnosis not present

## 2017-02-27 DIAGNOSIS — Z79899 Other long term (current) drug therapy: Secondary | ICD-10-CM | POA: Diagnosis not present

## 2017-02-27 DIAGNOSIS — M542 Cervicalgia: Secondary | ICD-10-CM | POA: Diagnosis not present

## 2017-02-28 ENCOUNTER — Ambulatory Visit (INDEPENDENT_AMBULATORY_CARE_PROVIDER_SITE_OTHER): Payer: Medicare HMO | Admitting: *Deleted

## 2017-02-28 DIAGNOSIS — I639 Cerebral infarction, unspecified: Secondary | ICD-10-CM

## 2017-03-01 NOTE — Progress Notes (Signed)
Carelink Summary Report / Loop Recorder 

## 2017-03-04 LAB — CUP PACEART REMOTE DEVICE CHECK
Implantable Pulse Generator Implant Date: 20180604
MDC IDC SESS DTM: 20181101233614

## 2017-03-06 DIAGNOSIS — G8929 Other chronic pain: Secondary | ICD-10-CM | POA: Diagnosis not present

## 2017-03-06 DIAGNOSIS — M5412 Radiculopathy, cervical region: Secondary | ICD-10-CM | POA: Diagnosis not present

## 2017-03-06 DIAGNOSIS — G894 Chronic pain syndrome: Secondary | ICD-10-CM | POA: Diagnosis not present

## 2017-03-06 DIAGNOSIS — I69153 Hemiplegia and hemiparesis following nontraumatic intracerebral hemorrhage affecting right non-dominant side: Secondary | ICD-10-CM | POA: Diagnosis not present

## 2017-03-06 DIAGNOSIS — M542 Cervicalgia: Secondary | ICD-10-CM | POA: Diagnosis not present

## 2017-03-06 DIAGNOSIS — Z79899 Other long term (current) drug therapy: Secondary | ICD-10-CM | POA: Diagnosis not present

## 2017-03-06 DIAGNOSIS — I679 Cerebrovascular disease, unspecified: Secondary | ICD-10-CM | POA: Diagnosis not present

## 2017-03-06 DIAGNOSIS — F1120 Opioid dependence, uncomplicated: Secondary | ICD-10-CM | POA: Diagnosis not present

## 2017-03-06 DIAGNOSIS — I10 Essential (primary) hypertension: Secondary | ICD-10-CM | POA: Diagnosis not present

## 2017-03-27 DIAGNOSIS — M542 Cervicalgia: Secondary | ICD-10-CM | POA: Diagnosis not present

## 2017-03-27 DIAGNOSIS — I69153 Hemiplegia and hemiparesis following nontraumatic intracerebral hemorrhage affecting right non-dominant side: Secondary | ICD-10-CM | POA: Diagnosis not present

## 2017-03-27 DIAGNOSIS — I679 Cerebrovascular disease, unspecified: Secondary | ICD-10-CM | POA: Diagnosis not present

## 2017-03-27 DIAGNOSIS — G894 Chronic pain syndrome: Secondary | ICD-10-CM | POA: Diagnosis not present

## 2017-03-27 DIAGNOSIS — F1120 Opioid dependence, uncomplicated: Secondary | ICD-10-CM | POA: Diagnosis not present

## 2017-03-27 DIAGNOSIS — M5412 Radiculopathy, cervical region: Secondary | ICD-10-CM | POA: Diagnosis not present

## 2017-03-27 DIAGNOSIS — Z79899 Other long term (current) drug therapy: Secondary | ICD-10-CM | POA: Diagnosis not present

## 2017-03-27 DIAGNOSIS — I10 Essential (primary) hypertension: Secondary | ICD-10-CM | POA: Diagnosis not present

## 2017-04-01 ENCOUNTER — Ambulatory Visit (INDEPENDENT_AMBULATORY_CARE_PROVIDER_SITE_OTHER): Payer: Medicare HMO | Admitting: *Deleted

## 2017-04-01 DIAGNOSIS — Z6837 Body mass index (BMI) 37.0-37.9, adult: Secondary | ICD-10-CM | POA: Diagnosis not present

## 2017-04-01 DIAGNOSIS — I63522 Cerebral infarction due to unspecified occlusion or stenosis of left anterior cerebral artery: Secondary | ICD-10-CM | POA: Diagnosis not present

## 2017-04-01 DIAGNOSIS — F419 Anxiety disorder, unspecified: Secondary | ICD-10-CM | POA: Diagnosis not present

## 2017-04-01 DIAGNOSIS — R7301 Impaired fasting glucose: Secondary | ICD-10-CM | POA: Diagnosis not present

## 2017-04-01 DIAGNOSIS — I639 Cerebral infarction, unspecified: Secondary | ICD-10-CM

## 2017-04-01 DIAGNOSIS — E785 Hyperlipidemia, unspecified: Secondary | ICD-10-CM | POA: Diagnosis not present

## 2017-04-01 DIAGNOSIS — J449 Chronic obstructive pulmonary disease, unspecified: Secondary | ICD-10-CM | POA: Diagnosis not present

## 2017-04-01 DIAGNOSIS — I10 Essential (primary) hypertension: Secondary | ICD-10-CM | POA: Diagnosis not present

## 2017-04-01 DIAGNOSIS — F5104 Psychophysiologic insomnia: Secondary | ICD-10-CM | POA: Diagnosis not present

## 2017-04-01 DIAGNOSIS — Z1231 Encounter for screening mammogram for malignant neoplasm of breast: Secondary | ICD-10-CM | POA: Diagnosis not present

## 2017-04-01 NOTE — Progress Notes (Signed)
Carelink Summary Report / Loop Recorder 

## 2017-04-09 LAB — CUP PACEART REMOTE DEVICE CHECK
Date Time Interrogation Session: 20181202003634
MDC IDC PG IMPLANT DT: 20180604

## 2017-04-17 DIAGNOSIS — G894 Chronic pain syndrome: Secondary | ICD-10-CM | POA: Diagnosis not present

## 2017-04-17 DIAGNOSIS — I69153 Hemiplegia and hemiparesis following nontraumatic intracerebral hemorrhage affecting right non-dominant side: Secondary | ICD-10-CM | POA: Diagnosis not present

## 2017-04-17 DIAGNOSIS — M5412 Radiculopathy, cervical region: Secondary | ICD-10-CM | POA: Diagnosis not present

## 2017-04-17 DIAGNOSIS — F1120 Opioid dependence, uncomplicated: Secondary | ICD-10-CM | POA: Diagnosis not present

## 2017-04-17 DIAGNOSIS — I679 Cerebrovascular disease, unspecified: Secondary | ICD-10-CM | POA: Diagnosis not present

## 2017-04-17 DIAGNOSIS — I10 Essential (primary) hypertension: Secondary | ICD-10-CM | POA: Diagnosis not present

## 2017-04-17 DIAGNOSIS — M542 Cervicalgia: Secondary | ICD-10-CM | POA: Diagnosis not present

## 2017-04-17 DIAGNOSIS — Z79899 Other long term (current) drug therapy: Secondary | ICD-10-CM | POA: Diagnosis not present

## 2017-04-29 ENCOUNTER — Ambulatory Visit (INDEPENDENT_AMBULATORY_CARE_PROVIDER_SITE_OTHER): Payer: Medicare HMO | Admitting: *Deleted

## 2017-04-29 DIAGNOSIS — I639 Cerebral infarction, unspecified: Secondary | ICD-10-CM | POA: Diagnosis not present

## 2017-05-01 NOTE — Progress Notes (Signed)
Carelink Summary Report / Loop Recorder 

## 2017-05-13 DIAGNOSIS — Z0100 Encounter for examination of eyes and vision without abnormal findings: Secondary | ICD-10-CM | POA: Diagnosis not present

## 2017-05-14 LAB — CUP PACEART REMOTE DEVICE CHECK
Date Time Interrogation Session: 20190101010450
MDC IDC PG IMPLANT DT: 20180604

## 2017-05-22 DIAGNOSIS — M5412 Radiculopathy, cervical region: Secondary | ICD-10-CM | POA: Diagnosis not present

## 2017-05-22 DIAGNOSIS — G894 Chronic pain syndrome: Secondary | ICD-10-CM | POA: Diagnosis not present

## 2017-05-22 DIAGNOSIS — I679 Cerebrovascular disease, unspecified: Secondary | ICD-10-CM | POA: Diagnosis not present

## 2017-05-22 DIAGNOSIS — F1120 Opioid dependence, uncomplicated: Secondary | ICD-10-CM | POA: Diagnosis not present

## 2017-05-22 DIAGNOSIS — Z79899 Other long term (current) drug therapy: Secondary | ICD-10-CM | POA: Diagnosis not present

## 2017-05-22 DIAGNOSIS — I10 Essential (primary) hypertension: Secondary | ICD-10-CM | POA: Diagnosis not present

## 2017-05-22 DIAGNOSIS — M542 Cervicalgia: Secondary | ICD-10-CM | POA: Diagnosis not present

## 2017-05-22 DIAGNOSIS — I69153 Hemiplegia and hemiparesis following nontraumatic intracerebral hemorrhage affecting right non-dominant side: Secondary | ICD-10-CM | POA: Diagnosis not present

## 2017-05-27 DIAGNOSIS — G4733 Obstructive sleep apnea (adult) (pediatric): Secondary | ICD-10-CM | POA: Diagnosis not present

## 2017-05-27 DIAGNOSIS — R269 Unspecified abnormalities of gait and mobility: Secondary | ICD-10-CM | POA: Diagnosis not present

## 2017-05-29 ENCOUNTER — Ambulatory Visit (INDEPENDENT_AMBULATORY_CARE_PROVIDER_SITE_OTHER): Payer: Medicare HMO | Admitting: *Deleted

## 2017-05-29 ENCOUNTER — Ambulatory Visit: Payer: Medicare HMO | Admitting: Nurse Practitioner

## 2017-05-29 ENCOUNTER — Encounter: Payer: Self-pay | Admitting: Nurse Practitioner

## 2017-05-29 ENCOUNTER — Encounter (INDEPENDENT_AMBULATORY_CARE_PROVIDER_SITE_OTHER): Payer: Self-pay

## 2017-05-29 VITALS — BP 203/103 | HR 60 | Wt 183.8 lb

## 2017-05-29 DIAGNOSIS — I639 Cerebral infarction, unspecified: Secondary | ICD-10-CM

## 2017-05-29 DIAGNOSIS — Z8673 Personal history of transient ischemic attack (TIA), and cerebral infarction without residual deficits: Secondary | ICD-10-CM

## 2017-05-29 DIAGNOSIS — I10 Essential (primary) hypertension: Secondary | ICD-10-CM

## 2017-05-29 DIAGNOSIS — G8929 Other chronic pain: Secondary | ICD-10-CM | POA: Insufficient documentation

## 2017-05-29 HISTORY — DX: Other chronic pain: G89.29

## 2017-05-29 NOTE — Progress Notes (Signed)
[facility]  PATIENT: Lynn Rollins DOB: July 06, 1965   REASON FOR VISIT: Follow-up for stroke left ACA infarct, May 2018 HISTORY FROM: Patient alone at visit    [place] 1/30/2019CM [name], 52 year old female returns for follow-up with a history of left ACA infarct in May 2018.  She remains on Plavix for secondary stroke prevention without further stroke or TIA symptoms.  Blood pressure elevated in the office today at 170/96.  She claims she is compliant with her blood pressure medication but she is in a lot of pain today from her fibromyalgia.  She had recent changes in her pain medications.  She goes to a pain clinic in [place].  She gets little exercise.  Loop recorder so far has not shown atrial fibrillation.  She returns for reevaluation  11/26/16 VP42 year old female with hypertension, depression, fibromyalgia, presented to hospital on 09/27/16 for acute right-sided weakness. IV TPA was not given due to being out of window. In the vascular procedure was considered but patient and husband deferred. Patient was admitted for evaluation. Patient was found to have acute left ACA infarcts. Patient also had chronic infarcts in bilateral basal ganglia, left temporal lobe and left cerebellar peduncle. Patient had hypercoagulable and autoimmune workup which was negative. Patient was discharged on dual antiplatelet therapy. Patient had implanted loop recorder placed.  Since that time patient feels stable. She continues to have depression, anxiety, memory loss, fatigue. She has chronic pain.  In retrospect 10 days prior to hospital admission patient felt double vision transiently. She also had brief passing out spell.    REVIEW OF SYSTEMS: Full 14 system review of systems performed and notable only for those listed, all others are neg:  Constitutional: neg  Cardiovascular: neg Ear/Nose/Throat: neg  Skin: neg Eyes: neg Respiratory:  COPD Gastroitestinal: Chronic constipation Hematology/Lymphatic: neg  Endocrine: neg Musculoskeletal: Joint pain fibromyalgia, chronic pain Allergy/Immunology: neg Neurological: Memory loss Psychiatric: Depression Sleep : Obstructive sleep apnea with CPAP   ALLERGIES: Allergies  Allergen Reactions  . Celexa [Citalopram Hydrobromide]     "made me cry and made me worse"  . Statins Other (See Comments)    Patient cannot recall reaction  . Tape Other (See Comments)    Patient has Fibromyalgia and cannot tolerate medical tape and it's removal    HOME MEDICATIONS: Outpatient Medications Prior to Visit  Medication Sig Dispense Refill  . albuterol (PROVENTIL HFA;VENTOLIN HFA) 108 (90 BASE) MCG/ACT inhaler Inhale 2 puffs into the lungs every 4 (four) hours as needed for wheezing.     Marland Kitchen amLODipine (NORVASC) 10 MG tablet Take 10 mg by mouth daily.    Marland Kitchen BREO ELLIPTA 200-25 MCG/INH AEPB Inhale 1 puff into the lungs daily.  3  . cloNIDine (CATAPRES) 0.1 MG tablet Take 0.1 mg by mouth 3 (three) times daily.    . clopidogrel (PLAVIX) 75 MG tablet Take 75 mg by mouth daily.    Marland Kitchen DEXILANT 60 MG capsule Take 60 mg by mouth daily.  4  . diazepam (VALIUM) 5 MG tablet Take 0.5 tablets (2.5 mg total) by mouth 3 (three) times daily as needed for anxiety. 30 tablet 0  . dicyclomine (BENTYL) 20 MG tablet Take 20 mg by mouth every 6 (six) hours as needed for spasms.     . furosemide (LASIX) 40 MG tablet Take 40 mg by mouth 2 (two) times daily.  12  . HYSINGLA ER 20 MG T24A Take 1 tablet by mouth 2 (two) times daily.     Marland Kitchen  labetalol (NORMODYNE) 300 MG tablet Take 300 mg by mouth 2 (two) times daily.  3  . lisinopril (PRINIVIL,ZESTRIL) 40 MG tablet Take 40 mg by mouth daily.    . mupirocin ointment (BACTROBAN) 2 % Apply 1 application topically 3 (three) times daily.    . orphenadrine (NORFLEX) 100 MG tablet Take 100 mg by mouth 2 (two) times daily as needed for muscle spasms.     . OXYCONTIN 10 MG 12 hr  tablet 10 mg 3 (three) times daily. Will begin on 06/03/17    . ranitidine (ZANTAC) 300 MG tablet Take 300 mg by mouth at bedtime.  3  . SPIRIVA RESPIMAT 1.25 MCG/ACT AERS Inhale 1 puff into the lungs 2 (two) times daily.  3  . temazepam (RESTORIL) 30 MG capsule Take 1 capsule (30 mg total) by mouth at bedtime. 30 capsule 5  . Vitamin D, Ergocalciferol, (DRISDOL) 50000 units CAPS capsule Take 50,000 Units by mouth once a week.  12  . XTAMPZA ER 9 MG C12A 9 mg 3 (three) times daily. Will stop on 06/03/17 when she starts Oxycontin    . aspirin EC 325 MG EC tablet Take 1 tablet (325 mg total) by mouth daily. 30 tablet 0  . citalopram (CELEXA) 20 MG tablet Take 20 mg by mouth daily as needed (for mood).      No facility-administered medications prior to visit.     PAST MEDICAL HISTORY: Past Medical History:  Diagnosis Date  . Anxiety   . Asthma   . Brain lesion 2011-2012   MRI  . Chronic fatigue   . COPD (chronic obstructive pulmonary disease) ([place])   . Depression   . Fibromyalgia   . Hypertension   . Stroke [facility])     PAST SURGICAL HISTORY: Past Surgical History:  Procedure Laterality Date  . ABDOMINAL HYSTERECTOMY    . CHOLECYSTECTOMY    . LOOP RECORDER INSERTION N/A 10/01/2016   Procedure: Loop Recorder Insertion;  Surgeon: Deboraha Sprang, MD;  Location: [place] [facility];  Service: Cardiovascular;  Laterality: N/A;  . TEE WITHOUT CARDIOVERSION N/A 10/01/2016   Procedure: TRANSESOPHAGEAL ECHOCARDIOGRAM (TEE);  Surgeon: Acie Fredrickson Wonda Cheng, MD;  Location[facility] ENDOSCOPY;  Service: Cardiovascular;  Laterality: N/A;  . TUBAL LIGATION      FAMILY HISTORY: Family History  Problem Relation Age of Onset  . Stroke Mother   . Parkinson's disease Father   . Heart disease Father   . Stroke Father   . Multiple sclerosis Brother     SOCIAL HISTORY: Social History   Socioeconomic History  . Marital status: Married    Spouse name: Not on file  . Number of children: Not on file  . Years of  education: Not on file  . Highest education level: Not on file  Social Needs  . Financial resource strain: Not on file  . Food insecurity - worry: Not on file  . Food insecurity - inability: Not on file  . Transportation needs - medical: Not on file  . Transportation needs - non-medical: Not on file  Occupational History  . Not on file  Tobacco Use  . Smoking status: Never Smoker  . Smokeless tobacco: Never Used  Substance and Sexual Activity  . Alcohol use: No  . Drug use: No  . Sexual activity: Not on file  Other Topics Concern  . Not on file  Social History Narrative   Lives at home with Antony Haste, husband.  She is disabled.  Education graduate.  2 children.  Caffeine 2 drinks per day.  (most).     PHYSICAL EXAM  Vitals:   05/29/17 1239 05/29/17 1248  BP: (!) 170/96  (!) 203/103  Pulse: 64 60  Weight: 183 lb 12.8 oz (83.4 kg)    Body mass index is 38.41 kg/m.  Generalized: Well developed, obese female in no acute distress  Head: normocephalic and atraumatic,. Oropharynx benign  Neck: Supple, no carotid bruits  Cardiac: Regular rate rhythm, no murmur  Musculoskeletal: No deformity   Neurological examination   Mentation: Alert oriented to time, place, history taking. Attention span and concentration appropriate. Recent and remote memory intact.  Follows all commands speech and language fluent.   Cranial nerve II-XII: Fundoscopic exam reveals sharp disc margins.Pupils were equal round reactive to light extraocular movements were full, visual field were full on confrontational test. Facial sensation and strength were normal. hearing was intact to finger rubbing bilaterally. Uvula tongue midline. head turning and shoulder shrug were normal and symmetric.Tongue protrusion into cheek strength was normal. Motor: normal bulk and tone, 4/5 RUE and LUE full strength in  BLE, fine finger movements normal, no pronator drift. No focal weakness Sensory: normal and symmetric to light  touch, pinprick, and  Vibration, in the upper and lower extremities Coordination: finger-nose-finger, heel-to-shin bilaterally, no dysmetria Reflexes: 1+ upper lower and symmetric, plantar responses were flexor bilaterally. Gait and Station: Rising up from seated position without assistance, narrow stance,   able to perform tiptoe, and heel walking without difficulty. Tandem gait is unsteady.  Uses a single-point cane  DIAGNOSTIC DATA (LABS, IMAGING, TESTING) - I reviewed patient records, labs, notes, testing and imaging myself where available.  Lab Results  Component Value Date   WBC 10.6 (H) 09/29/2016   HGB 14.5 09/29/2016   HCT 44.8 09/29/2016   MCV 92.2 09/29/2016   PLT 307 09/29/2016      Component Value Date/Time   NA 137 10/01/2016 0532   K 4.0 10/01/2016 0532   CL 105 10/01/2016 0532   CO2 25 10/01/2016 0532   GLUCOSE 113 (H) 10/01/2016 0532   BUN 15 10/01/2016 0532   CREATININE 0.72 10/01/2016 0532   CALCIUM 9.4 10/01/2016 0532   PROT 6.9 09/27/2016 1611   ALBUMIN 3.8 09/27/2016 1611   AST 28 09/27/2016 1611   ALT 35 09/27/2016 1611   ALKPHOS 86 09/27/2016 1611   BILITOT 0.6 09/27/2016 1611   GFRNONAA >60 10/01/2016 0532   GFRAA >60 10/01/2016 0532   Lab Results  Component Value Date   CHOL 145 09/28/2016   HDL 46 09/28/2016   LDLCALC 48 09/28/2016   TRIG 256 (H) 09/28/2016   CHOLHDL 3.2 09/28/2016   Lab Results  Component Value Date   HGBA1C 5.7 (H) 09/28/2016    ASSESSMENT AND PLAN 52 y.o. year old female here with multiple anterior and posterior circulation ischemic infarcts, with recent left ACA infarct leading to hospital admission on 09/27/2016. Signs symptoms and testing results suspicious for embolic infarcts, but no source found.  Patient also has a history of fibromyalgia chronic obstructive pulmonary disease depression hypertension.  PLAN: Stressed the importance of management of risk factors to prevent further stroke Continue Plavix for  secondary stroke prevention Maintain strict control of hypertension with blood pressure goal below 130/90, today's reading 170/96  continue antihypertensive medications and F/U for bp management PCP Control of diabetes with hemoglobin A1c below 6.5 followed by primary care Cholesterol with LDL cholesterol less than 70, followed by primary care,  Exercise by walking, use cane if necessary, exercise will also help your fibromyalgia eat healthy diet with whole grains,  fresh fruits and vegetables Continue CPAP for obstructive sleep apnea Continue loop recorder monitoring, so far no atrial fib Follow-up in 6 months if stable will dismiss I spent 25 minutes in total face to face time with the patient more than 50% of which was spent counseling and coordination of care, reviewing test results reviewing medications and discussing and reviewing the diagnosis of stroke and management of risk factors and further treatment options. , Rayburn Ma, [place], APRN  [facility] [address], [place], [place] [address] [phone]

## 2017-05-29 NOTE — Patient Instructions (Signed)
Stressed the importance of management of risk factors to prevent further stroke Continue Plavix for secondary stroke prevention Maintain strict control of hypertension with blood pressure goal below 130/90, today's reading 170/96  continue antihypertensive medications and F/U for bp management Control of diabetes with hemoglobin A1c below 6.5 followed by primary care Cholesterol with LDL cholesterol less than 70, followed by primary care,  Exercise by walking,  eat healthy diet with whole grains,  fresh fruits and vegetables Continue CPAP for obstructive sleep apnea Continue loop recorder monitoring, so far no atrial fib Follow-up in 6 months

## 2017-05-30 NOTE — Progress Notes (Signed)
Carelink Summary Report / Loop Recorder 

## 2017-06-11 LAB — CUP PACEART REMOTE DEVICE CHECK
Date Time Interrogation Session: 20190131013732
MDC IDC PG IMPLANT DT: 20180604

## 2017-06-11 NOTE — Progress Notes (Signed)
I reviewed note and agree with plan.   Jadelin Eng R. Moustafa Mossa, MD 06/11/2017, 9:14 AM Certified in Neurology, Neurophysiology and Neuroimaging  [facility] [address], [address] [place], [place] [address] [phone]  

## 2017-06-19 DIAGNOSIS — I679 Cerebrovascular disease, unspecified: Secondary | ICD-10-CM | POA: Diagnosis not present

## 2017-06-19 DIAGNOSIS — Z79899 Other long term (current) drug therapy: Secondary | ICD-10-CM | POA: Diagnosis not present

## 2017-06-19 DIAGNOSIS — G894 Chronic pain syndrome: Secondary | ICD-10-CM | POA: Diagnosis not present

## 2017-06-19 DIAGNOSIS — I69153 Hemiplegia and hemiparesis following nontraumatic intracerebral hemorrhage affecting right non-dominant side: Secondary | ICD-10-CM | POA: Diagnosis not present

## 2017-06-19 DIAGNOSIS — F1120 Opioid dependence, uncomplicated: Secondary | ICD-10-CM | POA: Diagnosis not present

## 2017-06-19 DIAGNOSIS — M5412 Radiculopathy, cervical region: Secondary | ICD-10-CM | POA: Diagnosis not present

## 2017-06-19 DIAGNOSIS — M542 Cervicalgia: Secondary | ICD-10-CM | POA: Diagnosis not present

## 2017-06-19 DIAGNOSIS — I10 Essential (primary) hypertension: Secondary | ICD-10-CM | POA: Diagnosis not present

## 2017-07-01 ENCOUNTER — Ambulatory Visit (INDEPENDENT_AMBULATORY_CARE_PROVIDER_SITE_OTHER): Payer: Medicare HMO | Admitting: *Deleted

## 2017-07-01 DIAGNOSIS — I639 Cerebral infarction, unspecified: Secondary | ICD-10-CM

## 2017-07-01 DIAGNOSIS — J45909 Unspecified asthma, uncomplicated: Secondary | ICD-10-CM | POA: Diagnosis not present

## 2017-07-01 DIAGNOSIS — R7301 Impaired fasting glucose: Secondary | ICD-10-CM | POA: Diagnosis not present

## 2017-07-01 DIAGNOSIS — I63522 Cerebral infarction due to unspecified occlusion or stenosis of left anterior cerebral artery: Secondary | ICD-10-CM | POA: Diagnosis not present

## 2017-07-01 DIAGNOSIS — F419 Anxiety disorder, unspecified: Secondary | ICD-10-CM | POA: Diagnosis not present

## 2017-07-01 DIAGNOSIS — E785 Hyperlipidemia, unspecified: Secondary | ICD-10-CM | POA: Diagnosis not present

## 2017-07-01 DIAGNOSIS — I10 Essential (primary) hypertension: Secondary | ICD-10-CM | POA: Diagnosis not present

## 2017-07-01 DIAGNOSIS — F5104 Psychophysiologic insomnia: Secondary | ICD-10-CM | POA: Diagnosis not present

## 2017-07-01 DIAGNOSIS — Z6837 Body mass index (BMI) 37.0-37.9, adult: Secondary | ICD-10-CM | POA: Diagnosis not present

## 2017-07-02 NOTE — Progress Notes (Signed)
Carelink Summary Report / Loop Recorder 

## 2017-07-17 DIAGNOSIS — I679 Cerebrovascular disease, unspecified: Secondary | ICD-10-CM | POA: Diagnosis not present

## 2017-07-17 DIAGNOSIS — Z79899 Other long term (current) drug therapy: Secondary | ICD-10-CM | POA: Diagnosis not present

## 2017-07-17 DIAGNOSIS — I10 Essential (primary) hypertension: Secondary | ICD-10-CM | POA: Diagnosis not present

## 2017-07-17 DIAGNOSIS — M5412 Radiculopathy, cervical region: Secondary | ICD-10-CM | POA: Diagnosis not present

## 2017-07-17 DIAGNOSIS — F1120 Opioid dependence, uncomplicated: Secondary | ICD-10-CM | POA: Diagnosis not present

## 2017-07-17 DIAGNOSIS — G894 Chronic pain syndrome: Secondary | ICD-10-CM | POA: Diagnosis not present

## 2017-07-17 DIAGNOSIS — M542 Cervicalgia: Secondary | ICD-10-CM | POA: Diagnosis not present

## 2017-07-17 DIAGNOSIS — I69153 Hemiplegia and hemiparesis following nontraumatic intracerebral hemorrhage affecting right non-dominant side: Secondary | ICD-10-CM | POA: Diagnosis not present

## 2017-08-05 ENCOUNTER — Ambulatory Visit (INDEPENDENT_AMBULATORY_CARE_PROVIDER_SITE_OTHER): Payer: Medicare HMO | Admitting: *Deleted

## 2017-08-05 DIAGNOSIS — I639 Cerebral infarction, unspecified: Secondary | ICD-10-CM | POA: Diagnosis not present

## 2017-08-06 NOTE — Progress Notes (Signed)
Carelink Summary Report / Loop Recorder 

## 2017-08-07 ENCOUNTER — Telehealth: Payer: Self-pay | Admitting: Cardiology

## 2017-08-07 NOTE — Telephone Encounter (Signed)
Patient called and stated that all of sudden she became sleepy, her ear started to ring, and she become dizzy. Pt didn't use symptom activator. Instructed pt to send a manual transmission once the transmission is receive a Device Tech RN will review and call back w/ results. Informed pt that if she experiences this again to go to the ER but since she feels fine now it is ok to wait to send the remote transmission. Pt will not be home until around 7 PM.

## 2017-08-08 ENCOUNTER — Emergency Department (HOSPITAL_COMMUNITY): Payer: Medicare HMO

## 2017-08-08 ENCOUNTER — Inpatient Hospital Stay (HOSPITAL_COMMUNITY)
Admission: EM | Admit: 2017-08-08 | Discharge: 2017-08-10 | DRG: 065 | Disposition: A | Discharge status: Home Health | Payer: Medicare HMO | Attending: Family Medicine | Admitting: Family Medicine

## 2017-08-08 ENCOUNTER — Encounter (HOSPITAL_COMMUNITY): Payer: Self-pay

## 2017-08-08 ENCOUNTER — Other Ambulatory Visit: Payer: Self-pay

## 2017-08-08 ENCOUNTER — Telehealth: Payer: Self-pay | Admitting: Nurse Practitioner

## 2017-08-08 DIAGNOSIS — F419 Anxiety disorder, unspecified: Secondary | ICD-10-CM | POA: Diagnosis not present

## 2017-08-08 DIAGNOSIS — Z888 Allergy status to other drugs, medicaments and biological substances status: Secondary | ICD-10-CM

## 2017-08-08 DIAGNOSIS — R531 Weakness: Secondary | ICD-10-CM | POA: Diagnosis not present

## 2017-08-08 DIAGNOSIS — H9193 Unspecified hearing loss, bilateral: Secondary | ICD-10-CM | POA: Diagnosis present

## 2017-08-08 DIAGNOSIS — Z79899 Other long term (current) drug therapy: Secondary | ICD-10-CM | POA: Diagnosis not present

## 2017-08-08 DIAGNOSIS — I739 Peripheral vascular disease, unspecified: Secondary | ICD-10-CM | POA: Diagnosis present

## 2017-08-08 DIAGNOSIS — I6389 Other cerebral infarction: Secondary | ICD-10-CM | POA: Diagnosis not present

## 2017-08-08 DIAGNOSIS — M797 Fibromyalgia: Secondary | ICD-10-CM | POA: Diagnosis not present

## 2017-08-08 DIAGNOSIS — Z8673 Personal history of transient ischemic attack (TIA), and cerebral infarction without residual deficits: Secondary | ICD-10-CM

## 2017-08-08 DIAGNOSIS — J449 Chronic obstructive pulmonary disease, unspecified: Secondary | ICD-10-CM | POA: Diagnosis not present

## 2017-08-08 DIAGNOSIS — T50905A Adverse effect of unspecified drugs, medicaments and biological substances, initial encounter: Secondary | ICD-10-CM | POA: Diagnosis present

## 2017-08-08 DIAGNOSIS — H532 Diplopia: Secondary | ICD-10-CM | POA: Diagnosis present

## 2017-08-08 DIAGNOSIS — Z79891 Long term (current) use of opiate analgesic: Secondary | ICD-10-CM

## 2017-08-08 DIAGNOSIS — I10 Essential (primary) hypertension: Secondary | ICD-10-CM | POA: Diagnosis not present

## 2017-08-08 DIAGNOSIS — Z7902 Long term (current) use of antithrombotics/antiplatelets: Secondary | ICD-10-CM

## 2017-08-08 DIAGNOSIS — T402X5A Adverse effect of other opioids, initial encounter: Secondary | ICD-10-CM | POA: Diagnosis present

## 2017-08-08 DIAGNOSIS — R000 Tachycardia, unspecified: Secondary | ICD-10-CM | POA: Diagnosis present

## 2017-08-08 DIAGNOSIS — I69351 Hemiplegia and hemiparesis following cerebral infarction affecting right dominant side: Secondary | ICD-10-CM | POA: Diagnosis not present

## 2017-08-08 DIAGNOSIS — K219 Gastro-esophageal reflux disease without esophagitis: Secondary | ICD-10-CM | POA: Diagnosis present

## 2017-08-08 DIAGNOSIS — I63513 Cerebral infarction due to unspecified occlusion or stenosis of bilateral middle cerebral arteries: Secondary | ICD-10-CM | POA: Diagnosis not present

## 2017-08-08 DIAGNOSIS — I776 Arteritis, unspecified: Secondary | ICD-10-CM

## 2017-08-08 DIAGNOSIS — R5383 Other fatigue: Secondary | ICD-10-CM | POA: Diagnosis present

## 2017-08-08 DIAGNOSIS — R402413 Glasgow coma scale score 13-15, at hospital admission: Secondary | ICD-10-CM | POA: Diagnosis present

## 2017-08-08 DIAGNOSIS — Z91048 Other nonmedicinal substance allergy status: Secondary | ICD-10-CM

## 2017-08-08 DIAGNOSIS — R42 Dizziness and giddiness: Secondary | ICD-10-CM | POA: Diagnosis present

## 2017-08-08 DIAGNOSIS — R5381 Other malaise: Secondary | ICD-10-CM | POA: Diagnosis present

## 2017-08-08 DIAGNOSIS — I639 Cerebral infarction, unspecified: Secondary | ICD-10-CM | POA: Diagnosis present

## 2017-08-08 DIAGNOSIS — R29700 NIHSS score 0: Secondary | ICD-10-CM | POA: Diagnosis present

## 2017-08-08 DIAGNOSIS — G8929 Other chronic pain: Secondary | ICD-10-CM | POA: Diagnosis present

## 2017-08-08 DIAGNOSIS — R51 Headache: Secondary | ICD-10-CM | POA: Diagnosis present

## 2017-08-08 DIAGNOSIS — I63322 Cerebral infarction due to thrombosis of left anterior cerebral artery: Secondary | ICD-10-CM | POA: Diagnosis not present

## 2017-08-08 DIAGNOSIS — Z7951 Long term (current) use of inhaled steroids: Secondary | ICD-10-CM

## 2017-08-08 DIAGNOSIS — Z95818 Presence of other cardiac implants and grafts: Secondary | ICD-10-CM

## 2017-08-08 DIAGNOSIS — J4489 Other specified chronic obstructive pulmonary disease: Secondary | ICD-10-CM | POA: Diagnosis present

## 2017-08-08 DIAGNOSIS — Z6839 Body mass index (BMI) 39.0-39.9, adult: Secondary | ICD-10-CM

## 2017-08-08 HISTORY — DX: Cerebral infarction, unspecified: I63.9

## 2017-08-08 LAB — URINALYSIS, ROUTINE W REFLEX MICROSCOPIC
BACTERIA UA: NONE SEEN
Bilirubin Urine: NEGATIVE
Glucose, UA: NEGATIVE mg/dL
Hgb urine dipstick: NEGATIVE
Ketones, ur: NEGATIVE mg/dL
Nitrite: NEGATIVE
PH: 5 (ref 5.0–8.0)
Protein, ur: NEGATIVE mg/dL
SPECIFIC GRAVITY, URINE: 1.014 (ref 1.005–1.030)

## 2017-08-08 LAB — COMPREHENSIVE METABOLIC PANEL
ALK PHOS: 81 U/L (ref 38–126)
ALT: 38 U/L (ref 14–54)
AST: 39 U/L (ref 15–41)
Albumin: 3.5 g/dL (ref 3.5–5.0)
Anion gap: 10 (ref 5–15)
BUN: 11 mg/dL (ref 6–20)
CALCIUM: 9.7 mg/dL (ref 8.9–10.3)
CHLORIDE: 105 mmol/L (ref 101–111)
CO2: 23 mmol/L (ref 22–32)
CREATININE: 0.7 mg/dL (ref 0.44–1.00)
GFR calc Af Amer: >60 mL/min (ref >60)
GFR calc non Af Amer: >60 mL/min (ref >60)
GLUCOSE: 132 mg/dL — AB (ref 65–99)
Potassium: 4.3 mmol/L (ref 3.5–5.1)
SODIUM: 138 mmol/L (ref 135–145)
Total Bilirubin: 0.4 mg/dL (ref 0.3–1.2)
Total Protein: 7 g/dL (ref 6.5–8.1)

## 2017-08-08 LAB — I-STAT CHEM 8, ED
BUN: 12 mg/dL (ref 6–20)
Calcium, Ion: 1.23 mmol/L (ref 1.15–1.40)
Chloride: 103 mmol/L (ref 101–111)
Creatinine, Ser: 0.6 mg/dL (ref 0.44–1.00)
GLUCOSE: 132 mg/dL — AB (ref 65–99)
HCT: 42 % (ref 36.0–46.0)
Hemoglobin: 14.3 g/dL (ref 12.0–15.0)
Potassium: 4.2 mmol/L (ref 3.5–5.1)
Sodium: 140 mmol/L (ref 135–145)
TCO2: 26 mmol/L (ref 22–32)

## 2017-08-08 LAB — CBC
HEMATOCRIT: 41.5 % (ref 36.0–46.0)
Hemoglobin: 13.7 g/dL (ref 12.0–15.0)
MCH: 29.4 pg (ref 26.0–34.0)
MCHC: 33 g/dL (ref 30.0–36.0)
MCV: 89.1 fL (ref 78.0–100.0)
PLATELETS: 306 10*3/uL (ref 150–400)
RBC: 4.66 MIL/uL (ref 3.87–5.11)
RDW: 13.5 % (ref 11.5–15.5)
WBC: 8.9 10*3/uL (ref 4.0–10.5)

## 2017-08-08 LAB — ETHANOL: Alcohol, Ethyl (B): <10 mg/dL (ref <10)

## 2017-08-08 LAB — DIFFERENTIAL
BASOS ABS: 0 10*3/uL (ref 0.0–0.1)
Basophils Relative: 0 %
Eosinophils Absolute: 0.2 10*3/uL (ref 0.0–0.7)
Eosinophils Relative: 2 %
LYMPHS PCT: 34 %
Lymphs Abs: 3.1 10*3/uL (ref 0.7–4.0)
MONO ABS: 0.3 10*3/uL (ref 0.1–1.0)
Monocytes Relative: 3 %
NEUTROS ABS: 5.6 10*3/uL (ref 1.7–7.7)
Neutrophils Relative %: 61 %

## 2017-08-08 LAB — RAPID URINE DRUG SCREEN, HOSP PERFORMED
AMPHETAMINES: NOT DETECTED
BARBITURATES: NOT DETECTED
BENZODIAZEPINES: POSITIVE — AB
Cocaine: NOT DETECTED
Opiates: NOT DETECTED
TETRAHYDROCANNABINOL: NOT DETECTED

## 2017-08-08 LAB — APTT: APTT: 28 s (ref 24–36)

## 2017-08-08 LAB — I-STAT TROPONIN, ED: Troponin i, poc: 0 ng/mL (ref 0.00–0.08)

## 2017-08-08 LAB — PROTIME-INR
INR: 1
Prothrombin Time: 13.1 seconds (ref 11.4–15.2)

## 2017-08-08 MED ORDER — STROKE: EARLY STAGES OF RECOVERY BOOK
Freq: Once | Status: AC
  Administered 2017-08-09: 02:00:00
  Filled 2017-08-08: qty 1

## 2017-08-08 MED ORDER — ACETAMINOPHEN 160 MG/5ML PO SOLN: 650.0000 mg | ORAL | Status: DC | PRN

## 2017-08-08 MED ORDER — FLUTICASONE FUROATE-VILANTEROL 200-25 MCG/INH IN AEPB
1.0000 | INHALATION_SPRAY | Freq: Every day | RESPIRATORY_TRACT | Status: DC
  Administered 2017-08-09 – 2017-08-10 (×2): 1 via RESPIRATORY_TRACT
  Filled 2017-08-08: qty 28

## 2017-08-08 MED ORDER — ALBUTEROL SULFATE (2.5 MG/3ML) 0.083% IN NEBU: 2.5000 mg | INHALATION_SOLUTION | RESPIRATORY_TRACT | Status: DC | PRN

## 2017-08-08 MED ORDER — CLONIDINE HCL 0.1 MG PO TABS
0.1000 mg | ORAL_TABLET | Freq: Three times a day (TID) | ORAL | Status: DC
  Administered 2017-08-08 – 2017-08-09 (×4): 0.1 mg via ORAL
  Filled 2017-08-08 (×4): qty 1

## 2017-08-08 MED ORDER — LORAZEPAM 1 MG PO TABS: 1.0000 mg | ORAL_TABLET | Freq: Once | ORAL | Status: DC

## 2017-08-08 MED ORDER — DIAZEPAM 5 MG PO TABS: 2.5000 mg | ORAL_TABLET | Freq: Three times a day (TID) | ORAL | Status: DC | PRN

## 2017-08-08 MED ORDER — CLOPIDOGREL BISULFATE 75 MG PO TABS
75.0000 mg | ORAL_TABLET | Freq: Every day | ORAL | Status: DC
  Administered 2017-08-09 – 2017-08-10 (×2): 75 mg via ORAL
  Filled 2017-08-08 (×2): qty 1

## 2017-08-08 MED ORDER — ACETAMINOPHEN 650 MG RE SUPP: 650.0000 mg | RECTAL | Status: DC | PRN

## 2017-08-08 MED ORDER — AMLODIPINE BESYLATE 10 MG PO TABS
10.0000 mg | ORAL_TABLET | Freq: Every day | ORAL | Status: DC
  Administered 2017-08-09: 10 mg via ORAL
  Filled 2017-08-08: qty 1

## 2017-08-08 MED ORDER — TEMAZEPAM 15 MG PO CAPS
30.0000 mg | ORAL_CAPSULE | Freq: Every day | ORAL | Status: DC
  Administered 2017-08-08 – 2017-08-09 (×2): 30 mg via ORAL
  Filled 2017-08-08 (×2): qty 2

## 2017-08-08 MED ORDER — LISINOPRIL 20 MG PO TABS
40.0000 mg | ORAL_TABLET | Freq: Every day | ORAL | Status: DC
  Administered 2017-08-09: 40 mg via ORAL
  Filled 2017-08-08: qty 2

## 2017-08-08 MED ORDER — TIOTROPIUM BROMIDE MONOHYDRATE 18 MCG IN CAPS
18.0000 ug | ORAL_CAPSULE | Freq: Every day | RESPIRATORY_TRACT | Status: DC
Start: 2017-08-09 — End: 2017-08-10
  Administered 2017-08-09 – 2017-08-10 (×2): 18 ug via RESPIRATORY_TRACT
  Filled 2017-08-08: qty 5

## 2017-08-08 MED ORDER — SENNOSIDES-DOCUSATE SODIUM 8.6-50 MG PO TABS: 1.0000 | ORAL_TABLET | Freq: Every evening | ORAL | Status: DC | PRN

## 2017-08-08 MED ORDER — FUROSEMIDE 40 MG PO TABS
40.0000 mg | ORAL_TABLET | Freq: Two times a day (BID) | ORAL | Status: DC
  Filled 2017-08-08 (×3): qty 1

## 2017-08-08 MED ORDER — ALBUTEROL SULFATE HFA 108 (90 BASE) MCG/ACT IN AERS: 2.0000 | INHALATION_SPRAY | RESPIRATORY_TRACT | Status: DC | PRN

## 2017-08-08 MED ORDER — OXYCODONE HCL ER 10 MG PO T12A
10.0000 mg | EXTENDED_RELEASE_TABLET | Freq: Three times a day (TID) | ORAL | Status: DC
  Administered 2017-08-08 – 2017-08-10 (×6): 10 mg via ORAL
  Filled 2017-08-08 (×6): qty 1

## 2017-08-08 MED ORDER — MUSCLE RUB 10-15 % EX CREA
1.0000 "application " | TOPICAL_CREAM | Freq: Every day | CUTANEOUS | Status: DC | PRN
  Filled 2017-08-08: qty 85

## 2017-08-08 MED ORDER — ACETAMINOPHEN 325 MG PO TABS: 650.0000 mg | ORAL_TABLET | ORAL | Status: DC | PRN

## 2017-08-08 MED ORDER — ENOXAPARIN SODIUM 40 MG/0.4ML ~~LOC~~ SOLN
40.0000 mg | SUBCUTANEOUS | Status: DC
  Administered 2017-08-08 – 2017-08-09 (×2): 40 mg via SUBCUTANEOUS
  Filled 2017-08-08 (×2): qty 0.4

## 2017-08-08 MED ORDER — DICYCLOMINE HCL 20 MG PO TABS: 20.0000 mg | ORAL_TABLET | Freq: Four times a day (QID) | ORAL | Status: DC | PRN

## 2017-08-08 MED ORDER — FAMOTIDINE 20 MG PO TABS
20.0000 mg | ORAL_TABLET | Freq: Every day | ORAL | Status: DC
  Administered 2017-08-09 – 2017-08-10 (×2): 20 mg via ORAL
  Filled 2017-08-08 (×2): qty 1

## 2017-08-08 MED ORDER — PANTOPRAZOLE SODIUM 40 MG PO TBEC
40.0000 mg | DELAYED_RELEASE_TABLET | Freq: Every day | ORAL | Status: DC
  Administered 2017-08-09 – 2017-08-10 (×2): 40 mg via ORAL
  Filled 2017-08-08 (×2): qty 1

## 2017-08-08 MED ORDER — IPRATROPIUM-ALBUTEROL 0.5-2.5 (3) MG/3ML IN SOLN: 3.0000 mL | Freq: Four times a day (QID) | RESPIRATORY_TRACT | Status: DC | PRN

## 2017-08-08 NOTE — ED Notes (Signed)
Patient is stable and ready to be transport to the floor at this time.  Report was called to [place].  Belongings taken with the patient to the floor.

## 2017-08-08 NOTE — ED Notes (Signed)
Patient is in MRI at this time, will be brought to room following MRI.  Family in the room at this time.

## 2017-08-08 NOTE — H&P (Signed)
History and Physical    Lynn Rollins:[id number] DOB: 1966-03-14 DOA: 08/08/2017  Referring MD/NP/PA: Ermalinda Memos, Do PCP: Nicoletta Dress, MD  Patient coming from: home  Chief Complaint:Dizziness  I have personally briefly reviewed patient's old medical records in [place]   HPI: Lynn Rollins is a 52 y.o. female with medical history significant of HTN, CVA, asthma/COPD, s/p loop recorder, fibromyalgia, and depression; who presents with complaints of dizziness beginning last night around 7 PM.  She reports having associated symptoms of double vision, deafness in her right ear, headache involving the crown of her head, and worsening right-sided weakness.  Patient previously had a stroke in 08/2016 of the left ACA that left her with residual right-sided weakness for which she ambulates with a cane.  Patient had been improved, but reports last night needing assistance to get into her house.  She laid down some improvement of symptoms.  She has a loop recorder in place and sent a transmission, but was told no abnormalities were noted this morning.  When she followed up with her neurologist at [facility] neurology, and told them of her symptoms she was advised to come in for further evaluation.  She chronically has a cough, intermittent wheezing, leg swelling, and issues with her memory.  Denies having any fever, chills, nausea, vomiting, diarrhea, palpitations, chest pain, loss of consciousness, recent falls, or significant changes in medications.    ED Course: Upon admission into the emergency prompt patient is known to be afebrile with vital signs relatively within normal limits. Labs were relatively unremarkable. Drug screen was positive for benzodiazepines. MRI /MRAstudies revealed an acute left basal ganglia infarct and moderate bilateral M2 segment stenosis.  Review of Systems  Constitutional: Positive for malaise/fatigue. Negative for chills and fever.  HENT: Negative for  ear discharge and nosebleeds.   Eyes: Positive for double vision. Negative for pain and discharge.  Respiratory: Positive for cough and wheezing. Negative for sputum production and shortness of breath.   Cardiovascular: Positive for leg swelling. Negative for chest pain and palpitations.  Gastrointestinal: Negative for abdominal pain, heartburn, nausea and vomiting.  Genitourinary: Negative for dysuria and frequency.  Musculoskeletal: Positive for back pain. Negative for falls.  Skin: Negative for itching and rash.  Neurological: Positive for dizziness and headaches.  Psychiatric/Behavioral: Positive for memory loss. Negative for substance abuse.    Past Medical History:  Diagnosis Date  . Anxiety   . Asthma   . Brain lesion 2011-2012   MRI  . Chronic fatigue   . COPD (chronic obstructive pulmonary disease) ([place])   . Depression   . Fibromyalgia   . Hypertension   . Stroke [facility])     Past Surgical History:  Procedure Laterality Date  . ABDOMINAL HYSTERECTOMY    . CHOLECYSTECTOMY    . LOOP RECORDER INSERTION N/A 10/01/2016   Procedure: Loop Recorder Insertion;  Surgeon: Deboraha Sprang, MD;  Location: [facility];  Service: Cardiovascular;  Laterality: N/A;  . TEE WITHOUT CARDIOVERSION N/A 10/01/2016   Procedure: TRANSESOPHAGEAL ECHOCARDIOGRAM (TEE);  Surgeon: Acie Fredrickson Wonda Cheng, MD;  Location: [facility] ENDOSCOPY;  Service: Cardiovascular;  Laterality: N/A;  . TUBAL LIGATION       reports that she has never smoked. She has never used smokeless tobacco. She reports that she does not drink alcohol or use drugs.  Allergies  Allergen Reactions  . Celexa [Citalopram Hydrobromide]     "made me cry and made me worse"  . Statins Other (  See Comments)    Patient cannot recall reaction  . Tape Other (See Comments)    Patient has Fibromyalgia and cannot tolerate medical tape and it's removal    Family History  Problem Relation Age of Onset  . Stroke Mother   . Parkinson's disease  Father   . Heart disease Father   . Stroke Father   . Multiple sclerosis Brother     Prior to Admission medications   Medication Sig Start Date End Date Taking? Authorizing Provider  albuterol (PROVENTIL HFA;VENTOLIN HFA) 108 (90 BASE) MCG/ACT inhaler Inhale 2 puffs into the lungs every 4 (four) hours as needed for wheezing.     [provider]  amLODipine (NORVASC) 10 MG tablet Take 10 mg by mouth daily. 09/26/16   [provider]  BREO ELLIPTA 200-25 MCG/INH AEPB Inhale 1 puff into the lungs daily. 08/24/16   [provider]  cloNIDine (CATAPRES) 0.1 MG tablet Take 0.1 mg by mouth 3 (three) times daily. 09/26/16   [provider]  clopidogrel (PLAVIX) 75 MG tablet Take 75 mg by mouth daily.    [provider]  DEXILANT 60 MG capsule Take 60 mg by mouth daily. 08/30/16   [provider]  diazepam (VALIUM) 5 MG tablet Take 0.5 tablets (2.5 mg total) by mouth 3 (three) times daily as needed for anxiety. 10/02/16   Reyne Dumas, MD  dicyclomine (BENTYL) 20 MG tablet Take 20 mg by mouth every 6 (six) hours as needed for spasms.     [provider]  furosemide (LASIX) 40 MG tablet Take 40 mg by mouth 2 (two) times daily. 09/19/16   [provider]  [facility] ER 20 MG T24A Take 1 tablet by mouth 2 (two) times daily.  11/12/16   [provider]  labetalol (NORMODYNE) 300 MG tablet Take 300 mg by mouth 2 (two) times daily. 09/01/16   [provider]  lisinopril (PRINIVIL,ZESTRIL) 40 MG tablet Take 40 mg by mouth daily.    [provider]  mupirocin ointment (BACTROBAN) 2 % Apply 1 application topically 3 (three) times daily.    [provider]  orphenadrine (NORFLEX) 100 MG tablet Take 100 mg by mouth 2 (two) times daily as needed for muscle spasms.     [provider]  OXYCONTIN 10 MG 12 hr tablet 10 mg 3 (three) times daily. Will begin on 06/03/17 04/03/17   [provider]  ranitidine  (ZANTAC) 300 MG tablet Take 300 mg by mouth at bedtime. 08/20/16   [provider]  SPIRIVA RESPIMAT 1.25 MCG/ACT AERS Inhale 1 puff into the lungs 2 (two) times daily. 09/11/16   [provider]  temazepam (RESTORIL) 30 MG capsule Take 1 capsule (30 mg total) by mouth at bedtime. 10/02/16   Reyne Dumas, MD  Vitamin D, Ergocalciferol, (DRISDOL) 50000 units CAPS capsule Take 50,000 Units by mouth once a week. 09/17/16   [provider]  XTAMPZA ER 9 MG C12A 9 mg 3 (three) times daily. Will stop on 06/03/17 when she starts Oxycontin 05/03/17   [provider]    Physical Exam:  Constitutional: NAD, calm, comfortable Vitals:   08/08/17 1729 08/08/17 2034  BP: (!) 158/73   Pulse: 66   Resp: 18   Temp:  98.3 F (36.8 C)  SpO2: 99%    Eyes: PERRL, lids and conjunctivae normal ENMT: Mucous membranes are moist. Posterior pharynx clear of any exudate or lesions.Normal dentition.  Neck: normal, supple, no masses, no  thyromegaly Respiratory: clear to auscultation bilaterally, no wheezing, no crackles. Normal respiratory effort. No accessory muscle use.  Cardiovascular: Regular rate and rhythm, no murmurs / rubs / gallops. No extremity edema. 2+ pedal pulses. No carotid bruits.  Abdomen: no tenderness, no masses palpated. No hepatosplenomegaly. Bowel sounds positive.  Musculoskeletal: no clubbing / cyanosis. No joint deformity upper and lower extremities. Good ROM, no contractures. Normal muscle tone.  Skin: no rashes, lesions, ulcers. No induration Neurologic: CN 2-12 grossly intact. Sensation intact, DTR normal. Strength 5/5 in all 4.  Psychiatric: Normal judgment and insight. Alert and oriented x 3. Normal mood.     Labs on Admission: I have personally reviewed following labs and imaging studies  CBC: Recent Labs  Lab 08/08/17 1513 08/08/17 1547 08/08/17 1600  WBC 8.9  --   --   NEUTROABS  --  5.6  --   HGB 13.7  --  14.3  HCT 41.5  --  42.0  MCV 89.1   --   --   PLT 306  --   --    Basic Metabolic Panel: Recent Labs  Lab 08/08/17 1547 08/08/17 1600  NA 138 140  K 4.3 4.2  CL 105 103  CO2 23  --   GLUCOSE 132* 132*  BUN 11 12  CREATININE 0.70 0.60  CALCIUM 9.7  --    GFR: CrCl cannot be calculated (Unknown ideal weight.). Liver Function Tests: Recent Labs  Lab 08/08/17 1547  AST 39  ALT 38  ALKPHOS 81  BILITOT 0.4  PROT 7.0  ALBUMIN 3.5   No results for input(s): LIPASE, AMYLASE in the last 168 hours. No results for input(s): AMMONIA in the last 168 hours. Coagulation Profile: Recent Labs  Lab 08/08/17 1547  INR 1.00   Cardiac Enzymes: No results for input(s): CKTOTAL, CKMB, CKMBINDEX, TROPONINI in the last 168 hours. BNP (last 3 results) No results for input(s): PROBNP in the last 8760 hours. HbA1C: No results for input(s): HGBA1C in the last 72 hours. CBG: No results for input(s): GLUCAP in the last 168 hours. Lipid Profile: No results for input(s): CHOL, HDL, LDLCALC, TRIG, CHOLHDL, LDLDIRECT in the last 72 hours. Thyroid Function Tests: No results for input(s): TSH, T4TOTAL, FREET4, T3FREE, THYROIDAB in the last 72 hours. Anemia Panel: No results for input(s): VITAMINB12, FOLATE, FERRITIN, TIBC, IRON, RETICCTPCT in the last 72 hours. Urine analysis:    Component Value Date/Time   COLORURINE YELLOW 08/08/2017 [place] 08/08/2017 1541   LABSPEC 1.014 08/08/2017 1541   PHURINE 5.0 08/08/2017 1541   GLUCOSEU NEGATIVE 08/08/2017 1541   HGBUR NEGATIVE 08/08/2017 1541   BILIRUBINUR NEGATIVE 08/08/2017 1541   KETONESUR NEGATIVE 08/08/2017 1541   PROTEINUR NEGATIVE 08/08/2017 1541   UROBILINOGEN 0.2 08/22/2011 1711   NITRITE NEGATIVE 08/08/2017 1541   LEUKOCYTESUR TRACE (A) 08/08/2017 1541   Sepsis Labs: No results found for this or any previous visit (from the past 240 hour(s)).   Radiological Exams on Admission: Mr Angiogram Head Wo Contrast  Result Date: 08/08/2017 CLINICAL  DATA:  RIGHT-sided weakness beginning last night, intermittent diplopia and decreased LEFT hearing with severe headache. History of hypertension, stroke. EXAM: MRI HEAD WITHOUT CONTRAST MRA HEAD WITHOUT CONTRAST TECHNIQUE: Multiplanar, multiecho pulse sequences of the brain and surrounding structures were obtained without intravenous contrast. Angiographic images of the head were obtained using MRA technique without contrast. COMPARISON:  MRI/MRA head Sep 27, 2016 FINDINGS: MRI HEAD FINDINGS INTRACRANIAL CONTENTS: Very faint reduced diffusion associated with  LEFT basal ganglia infarct, predominant T2 shine through and faint susceptibility artifact. No reduced diffusion to suggest acute ischemia. Patchy FLAIR T2 hyperintense signal LEFT posterior temporal lobe similar to prior examination. Old bilateral basal ganglia lacunar infarcts. Old LEFT brachium pontis to cerebellar hemisphere infarct. Minimal mesial LEFT frontal lobe encephalomalacia. Scattered subcentimeter supratentorial white matter hypodensities. No midline shift, mass effect or masses. No abnormal extra-axial fluid collections. VASCULAR: Normal major intracranial vascular flow voids present at skull base. SKULL AND UPPER CERVICAL SPINE: No abnormal sellar expansion. No suspicious calvarial bone marrow signal. Craniocervical junction maintained. SINUSES/ORBITS: The mastoid air-cells and included paranasal sinuses are well-aerated.The included ocular globes and orbital contents are non-suspicious. OTHER: None. MRA HEAD FINDINGS ANTERIOR CIRCULATION: Normal flow related enhancement of the included cervical, petrous, cavernous and supraclinoid internal carotid arteries. Patent fenestrated anterior communicating artery. 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 Moderate stenosis LEFT M2 segment (superior division) and RIGHT M2 segment (inferior division) worse than prior MRI. Anterior and middle cerebral arteries are otherwise patent.  No emergent large vessel occlusion, aneurysm. POSTERIOR CIRCULATION: Nearly codominant vertebral arteries. Basilar artery is patent, with normal flow related enhancement of the main branch vessels. Patent posterior cerebral arteries. Fetal origin RIGHT posterior cerebral artery. No large vessel occlusion, flow limiting stenosis,  aneurysm. ANATOMIC VARIANTS: None. Source images and MIP images were reviewed. IMPRESSION: MRI HEAD: 1. New early subacute LEFT basal ganglia infarct. 2. Old bilateral basal ganglia lacunar infarcts. 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 Old LEFT frontal/ACA and old LEFT temporal/MCA territory infarcts. 3. Mild chronic small vessel ischemic disease. MRA HEAD: 1. No emergent large vessel occlusion. Chronically occluded LEFT A2 segment. 2. Moderate stenosis bilateral M2 segments. Electronically Signed   By: Elon Alas M.D.   On: 08/08/2017 19:30   Mr Brain Wo Contrast  Result Date: 08/08/2017 CLINICAL DATA:  RIGHT-sided weakness beginning last night, intermittent diplopia and decreased LEFT hearing with severe headache. History of hypertension, stroke. EXAM: MRI HEAD WITHOUT CONTRAST MRA HEAD WITHOUT CONTRAST TECHNIQUE: Multiplanar, multiecho pulse sequences of the brain and surrounding structures were obtained without intravenous contrast. Angiographic images of the head were obtained using MRA technique without contrast. COMPARISON:  MRI/MRA head Sep 27, 2016 FINDINGS: MRI HEAD FINDINGS INTRACRANIAL CONTENTS: Very faint reduced diffusion associated with LEFT basal ganglia infarct, predominant T2 shine through and faint susceptibility artifact. No reduced diffusion to suggest acute ischemia. Patchy FLAIR T2 hyperintense signal LEFT posterior temporal lobe similar to prior examination. Old bilateral basal ganglia lacunar infarcts. Old LEFT brachium pontis to cerebellar hemisphere infarct. Minimal mesial LEFT frontal lobe encephalomalacia. Scattered subcentimeter  supratentorial white matter hypodensities. No midline shift, mass effect or masses. No abnormal extra-axial fluid collections. VASCULAR: Normal major intracranial vascular flow voids present at skull base. SKULL AND UPPER CERVICAL SPINE: No abnormal sellar expansion. No suspicious calvarial bone marrow signal. Craniocervical junction maintained. SINUSES/ORBITS: The mastoid air-cells and included paranasal sinuses are well-aerated.The included ocular globes and orbital contents are non-suspicious. OTHER: None. MRA HEAD FINDINGS ANTERIOR CIRCULATION: Normal flow related enhancement of the included cervical, petrous, cavernous and supraclinoid internal carotid arteries. Patent fenestrated anterior communicating artery. 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 Moderate stenosis LEFT M2 segment (superior division) and RIGHT M2 segment (inferior division) worse than prior MRI. Anterior and middle cerebral arteries are otherwise patent. No emergent large vessel occlusion, aneurysm. POSTERIOR CIRCULATION: Nearly codominant vertebral arteries. Basilar artery is patent, with normal flow related enhancement of the main branch vessels. Patent posterior cerebral arteries.  Fetal origin RIGHT posterior cerebral artery. No large vessel occlusion, flow limiting stenosis,  aneurysm. ANATOMIC VARIANTS: None. Source images and MIP images were reviewed. IMPRESSION: MRI HEAD: 1. New early subacute LEFT basal ganglia infarct. 2. Old bilateral basal ganglia lacunar infarcts. 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 Old LEFT frontal/ACA and old LEFT temporal/MCA territory infarcts. 3. Mild chronic small vessel ischemic disease. MRA HEAD: 1. No emergent large vessel occlusion. Chronically occluded LEFT A2 segment. 2. Moderate stenosis bilateral M2 segments. Electronically Signed   By: Elon Alas M.D.   On: 08/08/2017 19:30    EKG: Independently reviewed. Sinus rhythm at 73 bpm  Assessment/Plan CVA:   subacute.  Patient presents with complaints of dizziness, double vision, headache, and worsening right-sided weakness starting last night.  MRI imaging reveals subacute left basal ganglia infarct.  Patient loop recorder was reportedly interrogated and noted to show no arrhythmias.  Question cause of recurrent stroke. - Admit to telemetry bed - Stroke order set initiated - Neuro checks - PT/OT/Speech to eval and treat - Check echocardiogram and carotid vascular doppler ultrasound - Check Hemoglobin A1c and lipid panel in a.m. - Appreciate neurology consultative services, will follow-up for further recommendations  Essential hypertension - continue amlodipine, clonidine, and lisinopril  Anxiety - Continue Valium prn  Asthma/COPD: Patient reports no history of smoking. - continue Breo, Spiriva, and duoneb as needed  H/O CVA with residual deficit: Last stroke previously in 08/2016 with residual right-sided weakness.  Thought be cryptogenic in nature.  Followed by [facility] neurology. - Continue Plavix for now, follow-up with neurology recommendation  Chronic pain - Continue home pain medicine regimen  GERD - Continue pharmacy substitution of Protonix  DVT prophylaxis:Lovenox Code Status: full  Family Communication: Plan of care discussed with the patient and family present at bedside Disposition Plan: TBD Consults called: Neurology Admission status: inpatient  Norval Morton MD [facility] Pager [phone]   If 7PM-7AM, please contact night-coverage [email or web address] Password [facility]  08/08/2017, 8:41 PM

## 2017-08-08 NOTE — Telephone Encounter (Signed)
I spoke to pt and she will go to ED for evaluation as per recommended by CM/NP and Dr. Leta Baptist.  She can be evaluated and get work up there.  She will call her husband and he will take her.  She will go to [facility] vs [facility].

## 2017-08-08 NOTE — Telephone Encounter (Signed)
Discussed with Dr. Leta Baptist.  Her symptoms are concerning for another stroke.  We would recommend that she go to the ER and have a workup even though her symptoms are better.  Please call the patient.

## 2017-08-08 NOTE — Telephone Encounter (Signed)
Spoke to pt and she relayed that she had 3 episodes (back to back) , yesterday that consisted of double vision/ dizziness, feeling of like passing out,L hearing loss.  No nausea.  When had the first episode 5 min (at sisters, husband's birthday party) lasted for 5 min, subsequent were 2-3 mins,  when she lied down she got better.  She contacted tech relating to loop recorder and there was no afib during this time. I relayed when has sx that think are stroke, go to ED.  I relayed that she is on plavix, and if taking medications as prescribed for Bp , cholesterol , and also monitoring for afib.  She is doing what she can do.  She verbalized understanding.  Will send to CM/NP and see if other recommendations.

## 2017-08-08 NOTE — ED Provider Notes (Signed)
Patient placed in Quick Look pathway, seen and evaluated   Chief Complaint: Weakness and vision changes  HPI:   Lynn Rollins is a 51 y.o. female, with a history of CVA, presenting to the ED with right-sided weakness beginning around 7 PM last night.  Patient also with intermittent episodes of bilateral double vision, loss of hearing on the left, and severe headache in the bilateral frontal region.  She has had 3 such episodes and 7 PM last night.  States they last for a few minutes at a time, but weakness has persisted.  She notes no lasting deficits from her stroke May 2018, however, state her symptoms today are the same as her symptoms last May, including the intermittent features.  Denies chest pain, shortness of breath, fever/chills, loss of vision, falls/trauma, N/V/D.   ROS: Weakness (one)  Physical Exam:   Gen: No distress  Neuro: Awake and Alert  Skin: Warm    Focused Exam:   No diaphoresis.  No pallor.  Pulmonary: No increased work of breathing.  Speaks in full sentences without difficulty.  Lung sounds clear.  No tachypnea.  Cardiac: Normal rate and regular. Peripheral pulses intact.  Abdominal: No abdominal tenderness.  No peritoneal signs.  No rebound tenderness.  No guarding.  No CVA tenderness.  Neurologic:  No noted sensory deficits.  No arm drift.  No noted vision deficit. Grip strength weaker on the right. Strength 3/5 in the right upper extremity. Strength 5/5 in the left upper extremity. Strength 3/5 in the right lower extremity. Strength 4/5 in the left lower extremity. Coordination intact with finger to nose testing. Cranial nerves III-XII grossly intact. No facial droop.     Initiation of care has begun. The patient has been counseled on the process, plan, and necessity for staying for the completion/evaluation, and the remainder of the medical screening examination   Layla Maw 08/08/17 Samson, Kevin, MD 08/08/17 2329

## 2017-08-08 NOTE — ED Triage Notes (Signed)
Pt presents for evaluation of dizziness and visual changes starting yesterday. Pt reports the dizziness has been ongoing, however, the visual changes are intermittent. Pt has no focal neuro deficits. Has a loop recorder.

## 2017-08-08 NOTE — Progress Notes (Signed)
Patient arrived from ED around 2200 alert and oriented X4, right side weaker than left but no other focal deficits found. Having pain from fibromyalgia, will continue to monitor.

## 2017-08-08 NOTE — Telephone Encounter (Signed)
Pt called stating that yesterday 4/10 she got very dizzy, double vision and couldn't hear anything out of it. Unable to walk once it happened also stating the "epsoide" happened twice. Pt is very concerned and would like to discuss

## 2017-08-08 NOTE — Telephone Encounter (Signed)
LMOVM (DPR) advising that manual LINQ transmission shows no tachy or AF episodes since implant.  Pause and brady detection are off.  Advised patient to contact neurology regarding her symptom episode yesterday for further recommendations.  [facility] phone number for any further questions/concerns.

## 2017-08-08 NOTE — ED Provider Notes (Signed)
[facility] EMERGENCY DEPARTMENT Provider Note   CSN: [id number] Arrival date & time: 08/08/17  1449     History   Chief Complaint Chief Complaint  Patient presents with  . Dizziness    HPI MEI SUITS is a 52 y.o. female.  The history is provided by the patient.  Dizziness  Quality:  Imbalance Severity:  Severe Onset quality:  Gradual Duration:  1 day Timing:  Constant Progression:  Improving Chronicity:  New Context: not with inactivity   Context comment:  Hx of stroke and new onset of dizziness and double vision last night.  Relieved by:  Nothing Worsened by:  Nothing Associated symptoms: hearing loss, vision changes and weakness (RUE weakness at baseline)   Associated symptoms: no chest pain, no headaches, no nausea, no palpitations, no shortness of breath and no vomiting   Risk factors: hx of stroke     Past Medical History:  Diagnosis Date  . Anxiety   . Asthma   . Brain lesion 2011-2012   MRI  . Chronic fatigue   . COPD (chronic obstructive pulmonary disease) ([place])   . Depression   . Fibromyalgia   . Hypertension   . Stroke [facility]     Patient Active Problem List   Diagnosis Date Noted  . CVA (cerebral vascular accident) ([place]) 08/08/2017  . Chronic pain 05/29/2017  . History of stroke   . Cerebral embolism with cerebral infarction 09/27/2016  . Acute ischemic left ACA stroke ([place]) 09/27/2016  . Acute bronchitis 09/27/2016  . HTN (hypertension) 09/27/2016    Past Surgical History:  Procedure Laterality Date  . ABDOMINAL HYSTERECTOMY    . CHOLECYSTECTOMY    . LOOP RECORDER INSERTION N/A 10/01/2016   Procedure: Loop Recorder Insertion;  Surgeon: Deboraha Sprang, MD;  Location: [place] CV LAB;  Service: Cardiovascular;  Laterality: N/A;  . TEE WITHOUT CARDIOVERSION N/A 10/01/2016   Procedure: TRANSESOPHAGEAL ECHOCARDIOGRAM (TEE);  Surgeon: Acie Fredrickson Wonda Cheng, MD;  Location: [facility] ENDOSCOPY;  Service: Cardiovascular;  Laterality:  N/A;  . TUBAL LIGATION       OB History   None      Home Medications    Prior to Admission medications   Medication Sig Start Date End Date Taking? Authorizing Provider  albuterol (PROVENTIL HFA;VENTOLIN HFA) 108 (90 BASE) MCG/ACT inhaler Inhale 2 puffs into the lungs every 4 (four) hours as needed for wheezing.     [provider]  amLODipine (NORVASC) 10 MG tablet Take 10 mg by mouth daily. 09/26/16   [provider]  BREO ELLIPTA 200-25 MCG/INH AEPB Inhale 1 puff into the lungs daily. 08/24/16   [provider]  cloNIDine (CATAPRES) 0.1 MG tablet Take 0.1 mg by mouth 3 (three) times daily. 09/26/16   [provider]  clopidogrel (PLAVIX) 75 MG tablet Take 75 mg by mouth daily.    [provider]  DEXILANT 60 MG capsule Take 60 mg by mouth daily. 08/30/16   [provider]  diazepam (VALIUM) 5 MG tablet Take 0.5 tablets (2.5 mg total) by mouth 3 (three) times daily as needed for anxiety. 10/02/16   Reyne Dumas, MD  dicyclomine (BENTYL) 20 MG tablet Take 20 mg by mouth every 6 (six) hours as needed for spasms.     [provider]  furosemide (LASIX) 40 MG tablet Take 40 mg by mouth 2 (two) times daily. 09/19/16   [provider]  HYSINGLA ER 20 MG T24A Take 1 tablet by mouth  2 (two) times daily.  11/12/16   [provider]  labetalol (NORMODYNE) 300 MG tablet Take 300 mg by mouth 2 (two) times daily. 09/01/16   [provider]  lisinopril (PRINIVIL,ZESTRIL) 40 MG tablet Take 40 mg by mouth daily.    [provider]  mupirocin ointment (BACTROBAN) 2 % Apply 1 application topically 3 (three) times daily.    [provider]  orphenadrine (NORFLEX) 100 MG tablet Take 100 mg by mouth 2 (two) times daily as needed for muscle spasms.     [provider]  OXYCONTIN 10 MG 12 hr tablet 10 mg 3 (three) times daily. Will begin on 06/03/17 04/03/17   [provider]  ranitidine (ZANTAC)  300 MG tablet Take 300 mg by mouth at bedtime. 08/20/16   [provider]  SPIRIVA RESPIMAT 1.25 MCG/ACT AERS Inhale 1 puff into the lungs 2 (two) times daily. 09/11/16   [provider]  temazepam (RESTORIL) 30 MG capsule Take 1 capsule (30 mg total) by mouth at bedtime. 10/02/16   Reyne Dumas, MD  Vitamin D, Ergocalciferol, (DRISDOL) 50000 units CAPS capsule Take 50,000 Units by mouth once a week. 09/17/16   [provider]  XTAMPZA ER 9 MG C12A 9 mg 3 (three) times daily. Will stop on 06/03/17 when she starts Oxycontin 05/03/17   [provider]    Family History Family History  Problem Relation Age of Onset  . Stroke Mother   . Parkinson's disease Father   . Heart disease Father   . Stroke Father   . Multiple sclerosis Brother     Social History Social History   Tobacco Use  . Smoking status: Never Smoker  . Smokeless tobacco: Never Used  Substance Use Topics  . Alcohol use: No  . Drug use: No     Allergies   Celexa [citalopram hydrobromide]; Statins; and Tape   Review of Systems Review of Systems  Constitutional: Negative for chills and fever.  HENT: Positive for hearing loss. Negative for ear pain and sore throat.   Eyes: Negative for pain and visual disturbance.  Respiratory: Negative for cough and shortness of breath.   Cardiovascular: Negative for chest pain and palpitations.  Gastrointestinal: Negative for abdominal pain, nausea and vomiting.  Genitourinary: Negative for dysuria and hematuria.  Musculoskeletal: Negative for arthralgias and back pain.  Skin: Negative for color change and rash.  Neurological: Positive for dizziness and weakness (RUE weakness at baseline). Negative for seizures, syncope and headaches.  All other systems reviewed and are negative.    Physical Exam Updated Vital Signs  ED Triage Vitals [08/08/17 1729]  Enc Vitals Group     BP (!) 158/73     Pulse Rate 66     Resp 18     Temp      Temp src       SpO2 99 %     Weight      Height      Head Circumference      Peak Flow      Pain Score      Pain Loc      Pain Edu?      Excl. in [place]?     Physical Exam  Constitutional: She appears well-developed and well-nourished. No distress.  HENT:  Head: Normocephalic and atraumatic.  Eyes: Pupils are equal, round, and reactive to light. Conjunctivae and EOM are normal.  No obvious visual field deficits  Neck: Normal range of motion. Neck supple.  Cardiovascular: Normal rate and regular rhythm.  No murmur heard. Pulmonary/Chest: Effort normal and breath sounds normal. No respiratory distress.  Abdominal: Soft. There is no tenderness.  Musculoskeletal: Normal range of motion. She exhibits no edema.  Neurological: She is alert. No cranial nerve deficit or sensory deficit. She exhibits normal muscle tone. Coordination normal.  4+/5 RUE, 3+/5 RLE, 5+/5 LUE/LLE, normal sensation, no drift  Skin: Skin is warm and dry.  Psychiatric: She has a normal mood and affect.  Nursing note and vitals reviewed.    ED Treatments / Results  Labs (all labs ordered are listed, but only abnormal results are displayed) Labs Reviewed  URINALYSIS, ROUTINE W REFLEX MICROSCOPIC - Abnormal; Notable for the following components:      Result Value   Leukocytes, UA TRACE (*)    Squamous Epithelial / LPF 0-5 (*)    All other components within normal limits  COMPREHENSIVE METABOLIC PANEL - Abnormal; Notable for the following components:   Glucose, Bld 132 (*)    All other components within normal limits  RAPID URINE DRUG SCREEN, HOSP PERFORMED - Abnormal; Notable for the following components:   Benzodiazepines POSITIVE (*)    All other components within normal limits  I-STAT CHEM 8, ED - Abnormal; Notable for the following components:   Glucose, Bld 132 (*)    All other components within normal limits  CBC  ETHANOL  PROTIME-INR  APTT  DIFFERENTIAL  HEMOGLOBIN A1C  LIPID PANEL  CBC WITH  DIFFERENTIAL/PLATELET  BASIC METABOLIC PANEL  I-STAT TROPONIN, ED    EKG None  Radiology Mr Angiogram Head Wo Contrast  Result Date: 08/08/2017 CLINICAL DATA:  RIGHT-sided weakness beginning last night, intermittent diplopia and decreased LEFT hearing with severe headache. History of hypertension, stroke. EXAM: MRI HEAD WITHOUT CONTRAST MRA HEAD WITHOUT CONTRAST TECHNIQUE: Multiplanar, multiecho pulse sequences of the brain and surrounding structures were obtained without intravenous contrast. Angiographic images of the head were obtained using MRA technique without contrast. COMPARISON:  MRI/MRA head Sep 27, 2016 FINDINGS: MRI HEAD FINDINGS INTRACRANIAL CONTENTS: Very faint reduced diffusion associated with LEFT basal ganglia infarct, predominant T2 shine through and faint susceptibility artifact. No reduced diffusion to suggest acute ischemia. Patchy FLAIR T2 hyperintense signal LEFT posterior temporal lobe similar to prior examination. Old bilateral basal ganglia lacunar infarcts. Old LEFT brachium pontis to cerebellar hemisphere infarct. Minimal mesial LEFT frontal lobe encephalomalacia. Scattered subcentimeter supratentorial white matter hypodensities. No midline shift, mass effect or masses. No abnormal extra-axial fluid collections. VASCULAR: Normal major intracranial vascular flow voids present at skull base. SKULL AND UPPER CERVICAL SPINE: No abnormal sellar expansion. No suspicious calvarial bone marrow signal. Craniocervical junction maintained. SINUSES/ORBITS: The mastoid air-cells and included paranasal sinuses are well-aerated.The included ocular globes and orbital contents are non-suspicious. OTHER: None. MRA HEAD FINDINGS ANTERIOR CIRCULATION: Normal flow related enhancement of the included cervical, petrous, cavernous and supraclinoid internal carotid arteries. Patent fenestrated anterior communicating artery. 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 Moderate  stenosis LEFT M2 segment (superior division) and RIGHT M2 segment (inferior division) worse than prior MRI. Anterior and middle cerebral arteries are otherwise patent. No emergent large vessel occlusion, aneurysm. POSTERIOR CIRCULATION: Nearly codominant vertebral arteries. Basilar artery is patent, with normal flow related enhancement of the main branch vessels. Patent posterior cerebral arteries. Fetal origin RIGHT posterior cerebral artery. No large vessel occlusion, flow limiting stenosis,  aneurysm. ANATOMIC VARIANTS: None. Source images and MIP images were reviewed. IMPRESSION: MRI HEAD: 1. New early subacute LEFT basal  ganglia infarct. 2. Old bilateral basal ganglia lacunar infarcts. 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 Old LEFT frontal/ACA and old LEFT temporal/MCA territory infarcts. 3. Mild chronic small vessel ischemic disease. MRA HEAD: 1. No emergent large vessel occlusion. Chronically occluded LEFT A2 segment. 2. Moderate stenosis bilateral M2 segments. Electronically Signed   By: Elon Alas M.D.   On: 08/08/2017 19:30   Mr Brain Wo Contrast  Result Date: 08/08/2017 CLINICAL DATA:  RIGHT-sided weakness beginning last night, intermittent diplopia and decreased LEFT hearing with severe headache. History of hypertension, stroke. EXAM: MRI HEAD WITHOUT CONTRAST MRA HEAD WITHOUT CONTRAST TECHNIQUE: Multiplanar, multiecho pulse sequences of the brain and surrounding structures were obtained without intravenous contrast. Angiographic images of the head were obtained using MRA technique without contrast. COMPARISON:  MRI/MRA head Sep 27, 2016 FINDINGS: MRI HEAD FINDINGS INTRACRANIAL CONTENTS: Very faint reduced diffusion associated with LEFT basal ganglia infarct, predominant T2 shine through and faint susceptibility artifact. No reduced diffusion to suggest acute ischemia. Patchy FLAIR T2 hyperintense signal LEFT posterior temporal lobe similar to prior examination. Old bilateral basal  ganglia lacunar infarcts. Old LEFT brachium pontis to cerebellar hemisphere infarct. Minimal mesial LEFT frontal lobe encephalomalacia. Scattered subcentimeter supratentorial white matter hypodensities. No midline shift, mass effect or masses. No abnormal extra-axial fluid collections. VASCULAR: Normal major intracranial vascular flow voids present at skull base. SKULL AND UPPER CERVICAL SPINE: No abnormal sellar expansion. No suspicious calvarial bone marrow signal. Craniocervical junction maintained. SINUSES/ORBITS: The mastoid air-cells and included paranasal sinuses are well-aerated.The included ocular globes and orbital contents are non-suspicious. OTHER: None. MRA HEAD FINDINGS ANTERIOR CIRCULATION: Normal flow related enhancement of the included cervical, petrous, cavernous and supraclinoid internal carotid arteries. Patent fenestrated anterior communicating artery. 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 Moderate stenosis LEFT M2 segment (superior division) and RIGHT M2 segment (inferior division) worse than prior MRI. Anterior and middle cerebral arteries are otherwise patent. No emergent large vessel occlusion, aneurysm. POSTERIOR CIRCULATION: Nearly codominant vertebral arteries. Basilar artery is patent, with normal flow related enhancement of the main branch vessels. Patent posterior cerebral arteries. Fetal origin RIGHT posterior cerebral artery. No large vessel occlusion, flow limiting stenosis,  aneurysm. ANATOMIC VARIANTS: None. Source images and MIP images were reviewed. IMPRESSION: MRI HEAD: 1. New early subacute LEFT basal ganglia infarct. 2. Old bilateral basal ganglia lacunar infarcts. 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 Old LEFT frontal/ACA and old LEFT temporal/MCA territory infarcts. 3. Mild chronic small vessel ischemic disease. MRA HEAD: 1. No emergent large vessel occlusion. Chronically occluded LEFT A2 segment. 2. Moderate stenosis bilateral M2  segments. Electronically Signed   By: Elon Alas M.D.   On: 08/08/2017 19:30    Procedures Procedures (including critical care time)  Medications Ordered in ED Medications  LORazepam (ATIVAN) tablet 1 mg (1 mg Oral Not Given 08/08/17 2033)  fluticasone furoate-vilanterol (BREO ELLIPTA) 200-25 MCG/INH 1 puff (has no administration in time range)  albuterol (PROVENTIL HFA;VENTOLIN HFA) 108 (90 Base) MCG/ACT inhaler 2 puff (has no administration in time range)   stroke: mapping our early stages of recovery book (has no administration in time range)  acetaminophen (TYLENOL) tablet 650 mg (has no administration in time range)    Or  acetaminophen (TYLENOL) solution 650 mg (has no administration in time range)    Or  acetaminophen (TYLENOL) suppository 650 mg (has no administration in time range)  senna-docusate (Senokot-S) tablet 1 tablet (has no administration in time range)  enoxaparin (LOVENOX) injection 40 mg (has no administration in time  range)     Initial Impression / Assessment and Plan / ED Course  I have reviewed the triage vital signs and the nursing notes.  Pertinent labs & imaging results that were available during my care of the patient were reviewed by me and considered in my medical decision making (see chart for details).     Lynn Rollins is a 52 year old female with a history of stroke with right-sided deficits, hypertension, high cholesterol who presents to the ED with dizziness, hearing loss, diplopia.  Patient with unremarkable vitals.  Patient with symptoms that started about 24 hours ago with dizziness and double vision and right-sided hearing loss.  Patient has right-sided weakness at baseline and states that maybe she had some worse right-sided weakness.  Patient continued to feel dizzy and came to the ED for evaluation.  Patient was seen in the first look process and had lab work and MRI and MRA of her brain done prior to my evaluation.  Patient on exam is  mostly neurologically intact except for right upper and right lower extremity weakness.  No obvious visual field deficit.  Patient still feels mildly dizzy.  Patient had MRI of the brain done that showed new early subacute left basal ganglia infarct.  Patient otherwise with no significant electrolyte abnormality, acute kidney injury.  Patient had no signs of bleeding on scan.  Patient continues to be on Plavix.  Neuro hospitalist was consulted and patient to be admitted to hospitalist service for further care.  They came down to the ED to evaluate the patient and patient admitted in hemodynamically stable condition.  Final Clinical Impressions(s) / ED Diagnoses   Final diagnoses:  Cerebrovascular accident (CVA) due to other mechanism [facility])    ED Discharge Orders    None       Lennice Sites, DO 08/08/17 2057    Blanchie Dessert, MD 08/09/17 2350

## 2017-08-09 ENCOUNTER — Inpatient Hospital Stay (HOSPITAL_COMMUNITY): Payer: Medicare HMO

## 2017-08-09 DIAGNOSIS — I63513 Cerebral infarction due to unspecified occlusion or stenosis of bilateral middle cerebral arteries: Secondary | ICD-10-CM

## 2017-08-09 DIAGNOSIS — J4489 Other specified chronic obstructive pulmonary disease: Secondary | ICD-10-CM

## 2017-08-09 DIAGNOSIS — J449 Chronic obstructive pulmonary disease, unspecified: Secondary | ICD-10-CM | POA: Diagnosis present

## 2017-08-09 DIAGNOSIS — I63322 Cerebral infarction due to thrombosis of left anterior cerebral artery: Secondary | ICD-10-CM

## 2017-08-09 HISTORY — DX: Other specified chronic obstructive pulmonary disease: J44.89

## 2017-08-09 LAB — LIPID PANEL
CHOL/HDL RATIO: 4 ratio
Cholesterol: 124 mg/dL (ref 0–200)
HDL: 31 mg/dL — ABNORMAL LOW (ref >40)
LDL CALC: 52 mg/dL (ref 0–99)
TRIGLYCERIDES: 203 mg/dL — AB (ref <150)
VLDL: 41 mg/dL — ABNORMAL HIGH (ref 0–40)

## 2017-08-09 LAB — BASIC METABOLIC PANEL
Anion gap: 10 (ref 5–15)
BUN: 13 mg/dL (ref 6–20)
CALCIUM: 9 mg/dL (ref 8.9–10.3)
CO2: 23 mmol/L (ref 22–32)
Chloride: 105 mmol/L (ref 101–111)
Creatinine, Ser: 0.74 mg/dL (ref 0.44–1.00)
GFR calc Af Amer: >60 mL/min (ref >60)
GLUCOSE: 105 mg/dL — AB (ref 65–99)
Potassium: 4.1 mmol/L (ref 3.5–5.1)
Sodium: 138 mmol/L (ref 135–145)

## 2017-08-09 LAB — CBC WITH DIFFERENTIAL/PLATELET
BASOS ABS: 0 10*3/uL (ref 0.0–0.1)
Basophils Relative: 0 %
EOS PCT: 2 %
Eosinophils Absolute: 0.1 10*3/uL (ref 0.0–0.7)
HCT: 40.1 % (ref 36.0–46.0)
Hemoglobin: 13.2 g/dL (ref 12.0–15.0)
LYMPHS ABS: 5 10*3/uL — AB (ref 0.7–4.0)
LYMPHS PCT: 51 %
MCH: 29.4 pg (ref 26.0–34.0)
MCHC: 32.9 g/dL (ref 30.0–36.0)
MCV: 89.3 fL (ref 78.0–100.0)
MONO ABS: 0.4 10*3/uL (ref 0.1–1.0)
MONOS PCT: 4 %
Neutro Abs: 4.1 10*3/uL (ref 1.7–7.7)
Neutrophils Relative %: 43 %
PLATELETS: 282 10*3/uL (ref 150–400)
RBC: 4.49 MIL/uL (ref 3.87–5.11)
RDW: 13.6 % (ref 11.5–15.5)
WBC: 9.6 10*3/uL (ref 4.0–10.5)

## 2017-08-09 LAB — ECHOCARDIOGRAM COMPLETE
Height: 58 in
Weight: 3040.58 oz

## 2017-08-09 LAB — HEMOGLOBIN A1C
Hgb A1c MFr Bld: 5.9 % — ABNORMAL HIGH (ref 4.8–5.6)
Mean Plasma Glucose: 122.63 mg/dL

## 2017-08-09 MED ORDER — IOPAMIDOL (ISOVUE-370) INJECTION 76%
50.0000 mL | Freq: Once | INTRAVENOUS | Status: AC | PRN
Start: 2017-08-09 — End: 2017-08-09
  Administered 2017-08-09: 50 mL via INTRAVENOUS

## 2017-08-09 MED ORDER — ASPIRIN EC 325 MG PO TBEC
325.0000 mg | DELAYED_RELEASE_TABLET | Freq: Every day | ORAL | Status: DC
  Administered 2017-08-09: 325 mg via ORAL
  Filled 2017-08-09: qty 1

## 2017-08-09 MED ORDER — IOPAMIDOL (ISOVUE-370) INJECTION 76%
INTRAVENOUS | Status: AC
  Administered 2017-08-09: 11:00:00
  Filled 2017-08-09: qty 50

## 2017-08-09 NOTE — Progress Notes (Signed)
PROGRESS NOTE    JERE VANBUREN  GQQ:[id number] DOB: May 04, 1965 DOA: 08/08/2017 PCP: Nicoletta Dress, MD      Brief Narrative:  Dermody is a 52 y.o. female with medical history significant of HTN, CVA, asthma/COPD, s/p loop recorder, fibromyalgia, and depression; who presents with complaints of dizziness beginning last night around 7 PM.  She reports having associated symptoms of double vision, deafness in her right ear, headache involving the crown of her head, and worsening right-sided weakness.  Patient previously had a stroke in 08/2016 of the left ACA that left her with residual right-sided weakness for which she ambulates with a cane.  Patient had been improved, but reports last night needing assistance to get into her house.  She laid down some improvement of symptoms.  She has a loop recorder in place and sent a transmission, but was told no abnormalities were noted this morning.  When she followed up with her neurologist at [facility] neurology, and told them of her symptoms she was advised to come in for further evaluation.     Assessment & Plan:  Acute ischemic stroke MRI shows "New early subacute LEFT basal ganglia infarct".  Neurology differential is broad.  Of note, her CTA head today shows absent large vessel atherosclerosis in the neck, but out of proportion atherosclerosis of the intracranial vessels, consistent with a vasculitis or recurrent embolism.    Of note, last June, TEE, LE dopplers negative.  Loop recorder placed at that time, no AF episodes ever. -Continue Plavix -Lipids, hemoglobin A1c normal LDL, A1c normal -Echocardiogram pending -PT/OT/SLP have been consulted -Discussed with Ophthalmology by phone, recommend visual acuity assessment in hospital, if normal, referral as outpatient -Consult to Neurology, appreciate recommendations -VTE prophylaxis ordered at time of admission -Atrial fibrillation: not present -tPA not given because of outside  window -Dysphagia screen ordered in ER -Smoking cessation recommended -Audiology exam as outpatient -Follow APLA panel -LP recommended by Neurology, will defer specific CSF testing with Stroke team today, I would also favor reordering ESR/CRP although these were normal last year, ordering ANCAs, RF, anti-CCP for RA (she complains of widespread joint pain, although she has no objective joint findings)   Essential hypertension -Hold amlodipine, clonidine, lisinopril -PRN labetalol for blood pressure greater than 220/110  Anxiety -Continue Valium as needed  Asthma/COPD Non-smoker -Continue Brio, Spiriva, duo nebs    Chronic pain -Continue home pain medicine    GERD -Continue Protonix     DVT prophylaxis: Lovenox Code Status: FULL Family Communication: None presnt MDM and disposition Plan: The below labs and imaging reports were reviewed.  The patient was admitted with new focal neurological deficits, found to have new stroke, intracranial vascular disease. Number of diagnostic considerations is extensive, work up continues.     Consultants:   Neurology  Procedures:   Carotid US  Echo  CTA head and neck  MR brain  MRA head  LP pending  Antimicrobials:   None    Subjective: Fatigue.  Weakness generalized.  [place] better.  Sleepy.  Double vision better.  No change to visual acuity.  No oral ulcers.  Rashes possibly, she points out one single lesion that appears like a folliculitis, single.  However, overall her review of systems is pan-positive and suggestible, for example, when I say "do your joints hurt?" "yes", "which ones?" "all of them", or when I ask "are your knuckles swollen like this all the time?" (although they are not swollen at all), she say "yes".  Objective: Vitals:  08/09/17 0600 08/09/17 0810 08/09/17 0941 08/09/17 1039  BP: 125/60 137/76  121/70  Pulse: (!) 59 64  (!) 59  Resp: 15 17    Temp:  98.3 F (36.8 C)  98.6 F (37 C)   TempSrc:  Oral  Oral  SpO2: 99% 94% 96%   Weight:      Height:       No intake or output data in the 24 hours ending 08/09/17 1137 Filed Weights   08/08/17 2154 08/08/17 2300  Weight: 86.2 kg (190 lb 0.6 oz) 86.2 kg (190 lb 0.6 oz)    Examination: General appearance: Obese adult female, lying in bed, sleepy, arouses easily, interactive, appears tired.   HEENT: Anicteric, conjunctival pink, lids and lashes normal, no nasal deformity, discharge, epistaxis, lips moist, dentures in place, no oral lesions, OP moist..   Skin: Warm and dry.  No jaundice.  No suspicious rashes or lesions on the face, neck, upper chest, arms bilaterally, legs bilaterally feet. Cardiac: RRR, nl S1-S2, no murmurs appreciated.  Capillary refill is brisk.  JVP not visible.  No LE edema.  Radial pulses 2+ and symmetric. Respiratory: Normal respiratory rate and rhythm.  CTAB without rales or wheezes. Abdomen: Abdomen soft.  Mild nonfocal TTP without gaurding. No ascites, distension, hepatosplenomegaly.   MSK: No deformities or effusions in the small joints of either hand, nor effusions, swelling, deformity of the large joints of either upper or lower extremity. Neuro: Awake and alert.  EOMI, moves all extremities. Speech fluent.   Cranial nerves 3-12 intact.  No sensory deficits. Psych: Sensorium intact and responding to questions, attention normal. Affect blunted.  Judgment and insight appear normal.    Data Reviewed: I have personally reviewed following labs and imaging studies:  CBC: Recent Labs  Lab 08/08/17 1513 08/08/17 1547 08/08/17 1600 08/09/17 0622  WBC 8.9  --   --  9.6  NEUTROABS  --  5.6  --  4.1  HGB 13.7  --  14.3 13.2  HCT 41.5  --  42.0 40.1  MCV 89.1  --   --  89.3  PLT 306  --   --  127   Basic Metabolic Panel: Recent Labs  Lab 08/08/17 1547 08/08/17 1600 08/09/17 0622  NA 138 140 138  K 4.3 4.2 4.1  CL 105 103 105  CO2 23  --  23  GLUCOSE 132* 132* 105*  BUN 11 12 13    CREATININE 0.70 0.60 0.74  CALCIUM 9.7  --  9.0   GFR: Estimated Creatinine Clearance: 77.5 mL/min (by C-G formula based on SCr of 0.74 mg/dL). Liver Function Tests: Recent Labs  Lab 08/08/17 1547  AST 39  ALT 38  ALKPHOS 81  BILITOT 0.4  PROT 7.0  ALBUMIN 3.5   No results for input(s): LIPASE, AMYLASE in the last 168 hours. No results for input(s): AMMONIA in the last 168 hours. Coagulation Profile: Recent Labs  Lab 08/08/17 1547  INR 1.00   Cardiac Enzymes: No results for input(s): CKTOTAL, CKMB, CKMBINDEX, TROPONINI in the last 168 hours. BNP (last 3 results) No results for input(s): PROBNP in the last 8760 hours. HbA1C: Recent Labs    08/09/17 0622  HGBA1C 5.9*   CBG: No results for input(s): GLUCAP in the last 168 hours. Lipid Profile: Recent Labs    08/09/17 0622  CHOL 124  HDL 31*  LDLCALC 52  TRIG 203*  CHOLHDL 4.0   Thyroid Function Tests: No results for input(s): TSH, T4TOTAL, FREET4,  T3FREE, THYROIDAB in the last 72 hours. Anemia Panel: No results for input(s): VITAMINB12, FOLATE, FERRITIN, TIBC, IRON, RETICCTPCT in the last 72 hours. Urine analysis:    Component Value Date/Time   COLORURINE YELLOW 08/08/2017 [place] 08/08/2017 1541   LABSPEC 1.014 08/08/2017 1541   PHURINE 5.0 08/08/2017 1541   GLUCOSEU NEGATIVE 08/08/2017 1541   HGBUR NEGATIVE 08/08/2017 1541   BILIRUBINUR NEGATIVE 08/08/2017 1541   KETONESUR NEGATIVE 08/08/2017 1541   PROTEINUR NEGATIVE 08/08/2017 1541   UROBILINOGEN 0.2 08/22/2011 1711   NITRITE NEGATIVE 08/08/2017 1541   LEUKOCYTESUR TRACE (A) 08/08/2017 1541   Sepsis Labs: '@LABRCNTIP'$ (procalcitonin:4,lacticacidven:4)  )No results found for this or any previous visit (from the past 240 hour(s)).       Radiology Studies: Ct Angio Head W Or Wo Contrast  Result Date: 08/09/2017 CLINICAL DATA:  52 year old female with right side weakness found to have subacute left basal ganglia infarct.  Chronic left ACA A2 occlusion. Moderate bilateral M2 MCA stenosis suspected on MRI yesterday. EXAM: CT ANGIOGRAPHY HEAD AND NECK TECHNIQUE: Multidetector CT imaging of the head and neck was performed using the standard protocol during bolus administration of intravenous contrast. Multiplanar CT image reconstructions and MIPs were obtained to evaluate the vascular anatomy. Carotid stenosis measurements (when applicable) are obtained utilizing NASCET criteria, using the distal internal carotid diameter as the denominator. CONTRAST:  54m ISOVUE-370 IOPAMIDOL (ISOVUE-370) INJECTION 76% COMPARISON:  Brain MRI and MRA 08/08/2017, and earlier. FINDINGS: CT HEAD Brain: Hypodensity in the left corona radiata corresponding to the MRI described subacute lesion yesterday. More subtle chronic hypodensity elsewhere related to prior ischemia in the left ACA territory, bilateral basal ganglia, lateral left occipital lobe, left cerebellar peduncle. No midline shift, ventriculomegaly, mass effect, evidence of mass lesion, intracranial hemorrhage or evidence of cortically based acute infarction. Calvarium and skull base: Negative. Paranasal sinuses: Clear. Orbits: No acute orbit or scalp soft tissue findings. CTA NECK Skeleton: Absent maxillary and posterior mandible dentition. No acute osseous abnormality identified. Cervical spine disc and endplate degeneration. Upper chest: Negative lung apices. Superior mediastinal lipomatosis without lymphadenopathy. Other neck: Subcentimeter hypodense left thyroid nodule does not meet consensus criteria for ultrasound follow-up. Otherwise negative. No cervical lymphadenopathy. Aortic arch: 3 vessel arch configuration with no arch atherosclerosis. Right carotid system: Negative brachiocephalic artery and right CCA origin. Mild right CCA tortuosity. There is confluent calcified plaque at the posterior right ICA origin and bulge, but no stenosis results. Mild cervical right ICA tortuosity. Left  carotid system: Normal left CCA origin. Mild left CCA tortuosity. Normal left carotid bifurcation. Tortuous cervical left ICA without stenosis. Vertebral arteries: Minimal proximal right subclavian artery soft plaque without stenosis. Normal right vertebral artery origin. Mild right vertebral artery tortuosity with no stenosis to the skull base. Tortuous proximal left subclavian artery without plaque or stenosis. Normal left vertebral artery origin. Patent left vertebral artery to the skull base without stenosis. CTA HEAD Posterior circulation: Patent distal vertebral arteries without stenosis. The left is mildly dominant. There is minimal calcified plaque in the left V4 segment. Normal right PICA origin. As before, the left PICA is diminutive or absent and the left AICA is diminutive. The right AICA is normal. Patent vertebrobasilar junction and basilar artery without stenosis. Patent AICA and SCA origins. Normal left PCA origin. Fetal type right PCA origin. Diminutive or absent left posterior communicating artery. The proximal PCAs are mildly irregular. The distal PCA branches are mildly irregular. Anterior circulation: Patent ICA siphons with no atherosclerosis or  stenosis. Normal ophthalmic and right posterior communicating artery origins. Patent carotid termini. Normal MCA and ACA origins. Codominant A1 segments. Normal anterior communicating artery. Chronic occlusion of the proximal left ACA A2 is unchanged since 2018 (series 16, image 25). The right ACA and its branches are within normal limits. The left MCA M1 segment and bifurcation appear within normal limits. There is moderate to severe irregularity and stenosis of a left MCA M2 middle sylvian branch demonstrated on series 16, image 32 which is new since 2018 and stable to the MRA yesterday. The other MCA branches scratched at the other left MCA branches are within normal limits. The right MCA M1 segment and bifurcation appear normal. There is a similar  segment of moderate irregularity and stenosis in a posterior right MCA M2 branch seen on series 16, image 52 which is more apparent than on the MRA yesterday. This also appears new since 2018. There is otherwise mild right MCA branch irregularity. Venous sinuses: Patent. Anatomic variants: None. Delayed phase: No abnormal enhancement identified. Review of the MIP images confirms the above findings IMPRESSION: 1. Minimal atherosclerosis from the arch to the skull base - limited to the proximal right ICA with no stenosis. Otherwise normal appearance of the great vessel origins, and the carotid and vertebral arteries. 2. However, intracranially there is chronic left ACA A2 occlusion. And bilateral MCA M2 branch segments of moderate to severe irregularity (greater on the left) which has developed since a May 2018 MRA (see series 16, images 32 and 15). 3. The main differential considerations include disproportionate intracranial atherosclerosis, sequelae of intracranial thromboemboli, and intracranial vasculitis. 4. No acute intracranial abnormality. Stable CT appearance of the brain since the MRI yesterday. Electronically Signed   By: Genevie Ann M.D.   On: 08/09/2017 10:16   Ct Angio Neck W Or Wo Contrast  Result Date: 08/09/2017 CLINICAL DATA:  52 year old female with right side weakness found to have subacute left basal ganglia infarct. Chronic left ACA A2 occlusion. Moderate bilateral M2 MCA stenosis suspected on MRI yesterday. EXAM: CT ANGIOGRAPHY HEAD AND NECK TECHNIQUE: Multidetector CT imaging of the head and neck was performed using the standard protocol during bolus administration of intravenous contrast. Multiplanar CT image reconstructions and MIPs were obtained to evaluate the vascular anatomy. Carotid stenosis measurements (when applicable) are obtained utilizing NASCET criteria, using the distal internal carotid diameter as the denominator. CONTRAST:  51m ISOVUE-370 IOPAMIDOL (ISOVUE-370) INJECTION 76%  COMPARISON:  Brain MRI and MRA 08/08/2017, and earlier. FINDINGS: CT HEAD Brain: Hypodensity in the left corona radiata corresponding to the MRI described subacute lesion yesterday. More subtle chronic hypodensity elsewhere related to prior ischemia in the left ACA territory, bilateral basal ganglia, lateral left occipital lobe, left cerebellar peduncle. No midline shift, ventriculomegaly, mass effect, evidence of mass lesion, intracranial hemorrhage or evidence of cortically based acute infarction. Calvarium and skull base: Negative. Paranasal sinuses: Clear. Orbits: No acute orbit or scalp soft tissue findings. CTA NECK Skeleton: Absent maxillary and posterior mandible dentition. No acute osseous abnormality identified. Cervical spine disc and endplate degeneration. Upper chest: Negative lung apices. Superior mediastinal lipomatosis without lymphadenopathy. Other neck: Subcentimeter hypodense left thyroid nodule does not meet consensus criteria for ultrasound follow-up. Otherwise negative. No cervical lymphadenopathy. Aortic arch: 3 vessel arch configuration with no arch atherosclerosis. Right carotid system: Negative brachiocephalic artery and right CCA origin. Mild right CCA tortuosity. There is confluent calcified plaque at the posterior right ICA origin and bulge, but no stenosis results. Mild cervical right ICA  tortuosity. Left carotid system: Normal left CCA origin. Mild left CCA tortuosity. Normal left carotid bifurcation. Tortuous cervical left ICA without stenosis. Vertebral arteries: Minimal proximal right subclavian artery soft plaque without stenosis. Normal right vertebral artery origin. Mild right vertebral artery tortuosity with no stenosis to the skull base. Tortuous proximal left subclavian artery without plaque or stenosis. Normal left vertebral artery origin. Patent left vertebral artery to the skull base without stenosis. CTA HEAD Posterior circulation: Patent distal vertebral arteries without  stenosis. The left is mildly dominant. There is minimal calcified plaque in the left V4 segment. Normal right PICA origin. As before, the left PICA is diminutive or absent and the left AICA is diminutive. The right AICA is normal. Patent vertebrobasilar junction and basilar artery without stenosis. Patent AICA and SCA origins. Normal left PCA origin. Fetal type right PCA origin. Diminutive or absent left posterior communicating artery. The proximal PCAs are mildly irregular. The distal PCA branches are mildly irregular. Anterior circulation: Patent ICA siphons with no atherosclerosis or stenosis. Normal ophthalmic and right posterior communicating artery origins. Patent carotid termini. Normal MCA and ACA origins. Codominant A1 segments. Normal anterior communicating artery. Chronic occlusion of the proximal left ACA A2 is unchanged since 2018 (series 16, image 25). The right ACA and its branches are within normal limits. The left MCA M1 segment and bifurcation appear within normal limits. There is moderate to severe irregularity and stenosis of a left MCA M2 middle sylvian branch demonstrated on series 16, image 32 which is new since 2018 and stable to the MRA yesterday. The other MCA branches scratched at the other left MCA branches are within normal limits. The right MCA M1 segment and bifurcation appear normal. There is a similar segment of moderate irregularity and stenosis in a posterior right MCA M2 branch seen on series 16, image 52 which is more apparent than on the MRA yesterday. This also appears new since 2018. There is otherwise mild right MCA branch irregularity. Venous sinuses: Patent. Anatomic variants: None. Delayed phase: No abnormal enhancement identified. Review of the MIP images confirms the above findings IMPRESSION: 1. Minimal atherosclerosis from the arch to the skull base - limited to the proximal right ICA with no stenosis. Otherwise normal appearance of the great vessel origins, and the  carotid and vertebral arteries. 2. However, intracranially there is chronic left ACA A2 occlusion. And bilateral MCA M2 branch segments of moderate to severe irregularity (greater on the left) which has developed since a May 2018 MRA (see series 16, images 32 and 15). 3. The main differential considerations include disproportionate intracranial atherosclerosis, sequelae of intracranial thromboemboli, and intracranial vasculitis. 4. No acute intracranial abnormality. Stable CT appearance of the brain since the MRI yesterday. Electronically Signed   By: Genevie Ann M.D.   On: 08/09/2017 10:16   Mr Angiogram Head Wo Contrast  Result Date: 08/08/2017 CLINICAL DATA:  RIGHT-sided weakness beginning last night, intermittent diplopia and decreased LEFT hearing with severe headache. History of hypertension, stroke. EXAM: MRI HEAD WITHOUT CONTRAST MRA HEAD WITHOUT CONTRAST TECHNIQUE: Multiplanar, multiecho pulse sequences of the brain and surrounding structures were obtained without intravenous contrast. Angiographic images of the head were obtained using MRA technique without contrast. COMPARISON:  MRI/MRA head Sep 27, 2016 FINDINGS: MRI HEAD FINDINGS INTRACRANIAL CONTENTS: Very faint reduced diffusion associated with LEFT basal ganglia infarct, predominant T2 shine through and faint susceptibility artifact. No reduced diffusion to suggest acute ischemia. Patchy FLAIR T2 hyperintense signal LEFT posterior temporal lobe similar to prior  examination. Old bilateral basal ganglia lacunar infarcts. Old LEFT brachium pontis to cerebellar hemisphere infarct. Minimal mesial LEFT frontal lobe encephalomalacia. Scattered subcentimeter supratentorial white matter hypodensities. No midline shift, mass effect or masses. No abnormal extra-axial fluid collections. VASCULAR: Normal major intracranial vascular flow voids present at skull base. SKULL AND UPPER CERVICAL SPINE: No abnormal sellar expansion. No suspicious calvarial bone marrow  signal. Craniocervical junction maintained. SINUSES/ORBITS: The mastoid air-cells and included paranasal sinuses are well-aerated.The included ocular globes and orbital contents are non-suspicious. OTHER: None. MRA HEAD FINDINGS ANTERIOR CIRCULATION: Normal flow related enhancement of the included cervical, petrous, cavernous and supraclinoid internal carotid arteries. Patent fenestrated anterior communicating artery. 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 Moderate stenosis LEFT M2 segment (superior division) and RIGHT M2 segment (inferior division) worse than prior MRI. Anterior and middle cerebral arteries are otherwise patent. No emergent large vessel occlusion, aneurysm. POSTERIOR CIRCULATION: Nearly codominant vertebral arteries. Basilar artery is patent, with normal flow related enhancement of the main branch vessels. Patent posterior cerebral arteries. Fetal origin RIGHT posterior cerebral artery. No large vessel occlusion, flow limiting stenosis,  aneurysm. ANATOMIC VARIANTS: None. Source images and MIP images were reviewed. IMPRESSION: MRI HEAD: 1. New early subacute LEFT basal ganglia infarct. 2. Old bilateral basal ganglia lacunar infarcts. 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 Old LEFT frontal/ACA and old LEFT temporal/MCA territory infarcts. 3. Mild chronic small vessel ischemic disease. MRA HEAD: 1. No emergent large vessel occlusion. Chronically occluded LEFT A2 segment. 2. Moderate stenosis bilateral M2 segments. Electronically Signed   By: Elon Alas M.D.   On: 08/08/2017 19:30   Mr Brain Wo Contrast  Result Date: 08/08/2017 CLINICAL DATA:  RIGHT-sided weakness beginning last night, intermittent diplopia and decreased LEFT hearing with severe headache. History of hypertension, stroke. EXAM: MRI HEAD WITHOUT CONTRAST MRA HEAD WITHOUT CONTRAST TECHNIQUE: Multiplanar, multiecho pulse sequences of the brain and surrounding structures were obtained without  intravenous contrast. Angiographic images of the head were obtained using MRA technique without contrast. COMPARISON:  MRI/MRA head Sep 27, 2016 FINDINGS: MRI HEAD FINDINGS INTRACRANIAL CONTENTS: Very faint reduced diffusion associated with LEFT basal ganglia infarct, predominant T2 shine through and faint susceptibility artifact. No reduced diffusion to suggest acute ischemia. Patchy FLAIR T2 hyperintense signal LEFT posterior temporal lobe similar to prior examination. Old bilateral basal ganglia lacunar infarcts. Old LEFT brachium pontis to cerebellar hemisphere infarct. Minimal mesial LEFT frontal lobe encephalomalacia. Scattered subcentimeter supratentorial white matter hypodensities. No midline shift, mass effect or masses. No abnormal extra-axial fluid collections. VASCULAR: Normal major intracranial vascular flow voids present at skull base. SKULL AND UPPER CERVICAL SPINE: No abnormal sellar expansion. No suspicious calvarial bone marrow signal. Craniocervical junction maintained. SINUSES/ORBITS: The mastoid air-cells and included paranasal sinuses are well-aerated.The included ocular globes and orbital contents are non-suspicious. OTHER: None. MRA HEAD FINDINGS ANTERIOR CIRCULATION: Normal flow related enhancement of the included cervical, petrous, cavernous and supraclinoid internal carotid arteries. Patent fenestrated anterior communicating artery. 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 Moderate stenosis LEFT M2 segment (superior division) and RIGHT M2 segment (inferior division) worse than prior MRI. Anterior and middle cerebral arteries are otherwise patent. No emergent large vessel occlusion, aneurysm. POSTERIOR CIRCULATION: Nearly codominant vertebral arteries. Basilar artery is patent, with normal flow related enhancement of the main branch vessels. Patent posterior cerebral arteries. Fetal origin RIGHT posterior cerebral artery. No large vessel occlusion, flow limiting  stenosis,  aneurysm. ANATOMIC VARIANTS: None. Source images and MIP images were reviewed. IMPRESSION: MRI HEAD: 1. New  early subacute LEFT basal ganglia infarct. 2. Old bilateral basal ganglia lacunar infarcts. 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 Old LEFT frontal/ACA and old LEFT temporal/MCA territory infarcts. 3. Mild chronic small vessel ischemic disease. MRA HEAD: 1. No emergent large vessel occlusion. Chronically occluded LEFT A2 segment. 2. Moderate stenosis bilateral M2 segments. Electronically Signed   By: Elon Alas M.D.   On: 08/08/2017 19:30        Scheduled Meds: . amLODipine  10 mg Oral Daily  . cloNIDine  0.1 mg Oral TID  . clopidogrel  75 mg Oral Daily  . enoxaparin (LOVENOX) injection  40 mg Subcutaneous Q24H  . famotidine  20 mg Oral Daily  . fluticasone furoate-vilanterol  1 puff Inhalation Daily  . furosemide  40 mg Oral BID  . lisinopril  40 mg Oral Daily  . LORazepam  1 mg Oral Once  . oxyCODONE  10 mg Oral Q8H  . pantoprazole  40 mg Oral Daily  . temazepam  30 mg Oral QHS  . tiotropium  18 mcg Inhalation Daily   Continuous Infusions:   LOS: 1 day    Time spent: 35 minutes    Edwin Dada, MD [facility] 08/09/2017, 11:37 AM     Pager [phone] --- please page though AMION:  [email or web address] Password [email or web address] If 7PM-7AM, please contact night-coverage

## 2017-08-09 NOTE — Progress Notes (Signed)
Attempted to see pt this am. Pt states she has been up all night and cannot do one more thing and asked therapist to return at a later time.  Will check back as schedule allows. Jinger Neighbors, [place] [phone]

## 2017-08-09 NOTE — Care Management Note (Signed)
Case Management Note  Patient Details  Name: Lynn Rollins MRN: [id number] Date of Birth: [birth year]  Subjective/Objective:   52 yr old female admitted with c/o dizziness, loss of hearing in right ear. Patient found to have subacute Left basal ganglia infarct.                  Action/Plan: Case M   Expected Discharge Date:                  Expected Discharge Plan:  [place]  In-House Referral:  NA  Discharge planning Services  CM Consult  Post Acute Care Choice:  Home Health Choice offered to:  Patient  DME Arranged:  N/A(has RW and 3in1) DME Agency:     HH Arranged:  PT HH Agency:  [facility]  Status of Service:  Completed, signed off  If discussed at [place] of Stay Meetings, dates discussed:    Additional Comments:  Ninfa Meeker, RN 08/09/2017, 12:32 PM

## 2017-08-09 NOTE — Progress Notes (Signed)
  Echocardiogram 2D Echocardiogram has been performed.  Sion Thane T Nancyann Cotterman 08/09/2017, 2:28 PM

## 2017-08-09 NOTE — Progress Notes (Signed)
CTA neck done 4/12, carotids WNL. Please advise if carotid duplex still needed. Spoke with RN to follow up with MD as well. Landry Mellow, RDMS, RVT

## 2017-08-09 NOTE — Evaluation (Signed)
Physical Therapy Evaluation Patient Details Name: Lynn Rollins MRN: [id number] DOB: 1966-04-15 Today's Date: 08/09/2017   History of Present Illness  52 y.o. female with medical history significant of HTN, CVA, asthma/COPD, s/p loop recorder, fibromyalgia, and depression; who presents with complaints of dizziness beginning 08/08/17.  She reports having associated symptoms of double vision, deafness in her right ear, headache involving the crown of her head, and worsening right-sided weakness.  Patient previously had a stroke in 08/2016 of the left ACA that left her with residual right-sided weakness for which she ambulates with a cane.  Patient had been improved, but reports last night needing assistance to get into her house.  She laid down some improvement of symptoms.  She has a loop recorder in place and sent a transmission, but was told no abnormalities were noted 08/08/17.  When she followed up with her neurologist at [facility] neurology, and told them of her symptoms she was advised to come in for further evaluation.  Clinical Impression  Pt currently requires min A for gait and mobility, pt with decreased awareness of deficits and requires cuing for basic tasks. Pt will benefit from continued PT services to address deficits and improve functional mobility.    Follow Up Recommendations Home health PT;Supervision/Assistance - 24 hour    Equipment Recommendations  None recommended by PT(pt owns walker and spc)    Recommendations for Other Services       Precautions / Restrictions Precautions Precautions: Fall Restrictions Weight Bearing Restrictions: No      Mobility  Bed Mobility Overal bed mobility: Needs Assistance Bed Mobility: Supine to Sit;Sit to Supine     Supine to sit: Supervision Sit to supine: Supervision   General bed mobility comments: cues for use of bedrails to scoot up in bed, increased time and cuing  Transfers Overall transfer level: Needs  assistance Equipment used: Rolling walker (2 wheeled) Transfers: Sit to/from [facility] Sit to Stand: Min assist Stand pivot transfers: Min assist       General transfer comment: pt performs sit to stand and transfers to bed and toilet with RW and min A  Ambulation/Gait Ambulation/Gait assistance: Min assist Ambulation Distance (Feet): 20 Feet(x 2) Assistive device: Rolling walker (2 wheeled) Gait Pattern/deviations: Wide base of support     General Gait Details: pt with wide BOS, min guard for balance with gait to/from bathroom in room, cues for safety with RW with sidestepping  Stairs            Wheelchair Mobility    Modified Rankin (Stroke Patients Only)       Balance Overall balance assessment: Needs assistance           Standing balance-Leahy Scale: Poor Standing balance comment: min A for standing and taking 2 steps without AD. pt fearful and states "my balance is bad, but it was getting better"                             Pertinent Vitals/Pain Pain Assessment: No/denies pain    Home Living Family/patient expects to be discharged to:: Private residence Living Arrangements: Children;Spouse/significant other Available Help at Discharge: Family Type of Home: House Home Access: Stairs to enter Entrance Stairs-Rails: None Entrance Stairs-Number of Steps: 4 Home Layout: One level Home Equipment: Environmental consultant - 4 wheels;Cane - single point      Prior Function Level of Independence: Independent with assistive device(s)  Comments: used SPC     Hand Dominance        Extremity/Trunk Assessment   Upper Extremity Assessment Upper Extremity Assessment: Generalized weakness    Lower Extremity Assessment Lower Extremity Assessment: Generalized weakness    Cervical / Trunk Assessment Cervical / Trunk Assessment: Kyphotic  Communication   Communication: No difficulties  Cognition Arousal/Alertness:  Awake/alert Behavior During Therapy: Anxious Overall Cognitive Status: Impaired/Different from baseline                                 General Comments: pt states she feels "out of it" because of fatigue.  Pt appears anxioius and requires cues for attention to task. no family present to give baseline cognitive level      General Comments      Exercises     Assessment/Plan    PT Assessment Patient needs continued PT services  PT Problem List Decreased strength;Decreased mobility;Decreased safety awareness;Decreased activity tolerance;Decreased cognition;Decreased balance;Decreased knowledge of use of DME       PT Treatment Interventions DME instruction;Therapeutic activities;Cognitive remediation;Gait training;Therapeutic exercise;Patient/family education;Stair training;Balance training;Neuromuscular re-education;Functional mobility training;Wheelchair mobility training    PT Goals (Current goals can be found in the Care Plan section)  Acute Rehab PT Goals Patient Stated Goal: feel better PT Goal Formulation: With patient Time For Goal Achievement: 08/23/17 Potential to Achieve Goals: Good    Frequency Min 4X/week   Barriers to discharge Decreased caregiver support husband and son both work    Co-evaluation               AM-PAC PT "6 Clicks" Daily Activity  Outcome Measure Difficulty turning over in bed (including adjusting bedclothes, sheets and blankets)?: A Little Difficulty moving from lying on back to sitting on the side of the bed? : None Difficulty sitting down on and standing up from a chair with arms (e.g., wheelchair, bedside commode, etc,.)?: A Little Help needed moving to and from a bed to chair (including a wheelchair)?: A Little Help needed walking in hospital room?: A Little Help needed climbing 3-5 steps with a railing? : A Lot 6 Click Score: 18    End of Session   Activity Tolerance: Patient limited by fatigue Patient left: in  bed;with bed alarm set;with call bell/phone within reach Nurse Communication: Mobility status PT Visit Diagnosis: Unsteadiness on feet (R26.81);Muscle weakness (generalized) (M62.81);Difficulty in walking, not elsewhere classified (R26.2)    Time: 4818-5631 PT Time Calculation (min) (ACUTE ONLY): 23 min   Charges:   PT Evaluation $PT Eval Moderate Complexity: 1 Mod PT Treatments $Therapeutic Activity: 8-22 mins   PT G Codes:        Isabelle Course, PT, DPT  Mussa Groesbeck 08/09/2017, 10:25 AM

## 2017-08-09 NOTE — Progress Notes (Signed)
Patient rested well over night BP a little soft and de sated and required O2 with [place], but no onset of previous or new neuro symptoms.

## 2017-08-09 NOTE — Progress Notes (Addendum)
STROKE TEAM PROGRESS NOTE   SUBJECTIVE (INTERVAL HISTORY) Her husband is at the bedside.  We discussed her MRI brain results, treatment and testing recommendations. LP recommended to look for vasculitis - she has hesitant to perform but understands this recommendation. She reports her hearing loss is much improved as is her double vision. She feels dizzy with repetitive movement. She complains of being tired. States she slept poorly last night.    OBJECTIVE Vitals:   08/09/17 0810 08/09/17 0941 08/09/17 1039 08/09/17 1100  BP: 137/76  121/70 118/79  Pulse: 64  (!) 59 (!) 56  Resp: 17   13  Temp: 98.3 F (36.8 C)  98.6 F (37 C) 98.4 F (36.9 C)  TempSrc: Oral  Oral Oral  SpO2: 94% 96%  92%  Weight:      Height:        CBC:  Recent Labs  Lab 08/08/17 1513 08/08/17 1547 08/08/17 1600 08/09/17 0622  WBC 8.9  --   --  9.6  NEUTROABS  --  5.6  --  4.1  HGB 13.7  --  14.3 13.2  HCT 41.5  --  42.0 40.1  MCV 89.1  --   --  89.3  PLT 306  --   --  440    Basic Metabolic Panel:  Recent Labs  Lab 08/08/17 1547 08/08/17 1600 08/09/17 0622  NA 138 140 138  K 4.3 4.2 4.1  CL 105 103 105  CO2 23  --  23  GLUCOSE 132* 132* 105*  BUN 11 12 13   CREATININE 0.70 0.60 0.74  CALCIUM 9.7  --  9.0    Lipid Panel:     Component Value Date/Time   CHOL 124 08/09/2017 0622   TRIG 203 (H) 08/09/2017 0622   HDL 31 (L) 08/09/2017 0622   CHOLHDL 4.0 08/09/2017 0622   VLDL 41 (H) 08/09/2017 0622   LDLCALC 52 08/09/2017 0622   HgbA1c:  Lab Results  Component Value Date   HGBA1C 5.9 (H) 08/09/2017   Urine Drug Screen:     Component Value Date/Time   LABOPIA NONE DETECTED 08/08/2017 1541   COCAINSCRNUR NONE DETECTED 08/08/2017 1541   LABBENZ POSITIVE (A) 08/08/2017 1541   AMPHETMU NONE DETECTED 08/08/2017 1541   THCU NONE DETECTED 08/08/2017 1541   LABBARB NONE DETECTED 08/08/2017 1541    Alcohol Level     Component Value Date/Time   ETH <10 08/08/2017 1545     IMAGING  Ct Angio Head W Or Wo Contrast  Result Date: 08/09/2017 CLINICAL DATA:  52 year old female with right side weakness found to have subacute left basal ganglia infarct. Chronic left ACA A2 occlusion. Moderate bilateral M2 MCA stenosis suspected on MRI yesterday. EXAM: CT ANGIOGRAPHY HEAD AND NECK TECHNIQUE: Multidetector CT imaging of the head and neck was performed using the standard protocol during bolus administration of intravenous contrast. Multiplanar CT image reconstructions and MIPs were obtained to evaluate the vascular anatomy. Carotid stenosis measurements (when applicable) are obtained utilizing NASCET criteria, using the distal internal carotid diameter as the denominator. CONTRAST:  74mL ISOVUE-370 IOPAMIDOL (ISOVUE-370) INJECTION 76% COMPARISON:  Brain MRI and MRA 08/08/2017, and earlier. FINDINGS: CT HEAD Brain: Hypodensity in the left corona radiata corresponding to the MRI described subacute lesion yesterday. More subtle chronic hypodensity elsewhere related to prior ischemia in the left ACA territory, bilateral basal ganglia, lateral left occipital lobe, left cerebellar peduncle. No midline shift, ventriculomegaly, mass effect, evidence of mass lesion, intracranial hemorrhage or evidence of  cortically based acute infarction. Calvarium and skull base: Negative. Paranasal sinuses: Clear. Orbits: No acute orbit or scalp soft tissue findings. CTA NECK Skeleton: Absent maxillary and posterior mandible dentition. No acute osseous abnormality identified. Cervical spine disc and endplate degeneration. Upper chest: Negative lung apices. Superior mediastinal lipomatosis without lymphadenopathy. Other neck: Subcentimeter hypodense left thyroid nodule does not meet consensus criteria for ultrasound follow-up. Otherwise negative. No cervical lymphadenopathy. Aortic arch: 3 vessel arch configuration with no arch atherosclerosis. Right carotid system: Negative brachiocephalic artery and right  CCA origin. Mild right CCA tortuosity. There is confluent calcified plaque at the posterior right ICA origin and bulge, but no stenosis results. Mild cervical right ICA tortuosity. Left carotid system: Normal left CCA origin. Mild left CCA tortuosity. Normal left carotid bifurcation. Tortuous cervical left ICA without stenosis. Vertebral arteries: Minimal proximal right subclavian artery soft plaque without stenosis. Normal right vertebral artery origin. Mild right vertebral artery tortuosity with no stenosis to the skull base. Tortuous proximal left subclavian artery without plaque or stenosis. Normal left vertebral artery origin. Patent left vertebral artery to the skull base without stenosis. CTA HEAD Posterior circulation: Patent distal vertebral arteries without stenosis. The left is mildly dominant. There is minimal calcified plaque in the left V4 segment. Normal right PICA origin. As before, the left PICA is diminutive or absent and the left AICA is diminutive. The right AICA is normal. Patent vertebrobasilar junction and basilar artery without stenosis. Patent AICA and SCA origins. Normal left PCA origin. Fetal type right PCA origin. Diminutive or absent left posterior communicating artery. The proximal PCAs are mildly irregular. The distal PCA branches are mildly irregular. Anterior circulation: Patent ICA siphons with no atherosclerosis or stenosis. Normal ophthalmic and right posterior communicating artery origins. Patent carotid termini. Normal MCA and ACA origins. Codominant A1 segments. Normal anterior communicating artery. Chronic occlusion of the proximal left ACA A2 is unchanged since 2018 (series 16, image 25). The right ACA and its branches are within normal limits. The left MCA M1 segment and bifurcation appear within normal limits. There is moderate to severe irregularity and stenosis of a left MCA M2 middle sylvian branch demonstrated on series 16, image 32 which is new since 2018 and stable to  the MRA yesterday. The other MCA branches scratched at the other left MCA branches are within normal limits. The right MCA M1 segment and bifurcation appear normal. There is a similar segment of moderate irregularity and stenosis in a posterior right MCA M2 branch seen on series 16, image 52 which is more apparent than on the MRA yesterday. This also appears new since 2018. There is otherwise mild right MCA branch irregularity. Venous sinuses: Patent. Anatomic variants: None. Delayed phase: No abnormal enhancement identified. Review of the MIP images confirms the above findings IMPRESSION: 1. Minimal atherosclerosis from the arch to the skull base - limited to the proximal right ICA with no stenosis. Otherwise normal appearance of the great vessel origins, and the carotid and vertebral arteries. 2. However, intracranially there is chronic left ACA A2 occlusion. And bilateral MCA M2 branch segments of moderate to severe irregularity (greater on the left) which has developed since a May 2018 MRA (see series 16, images 32 and 15). 3. The main differential considerations include disproportionate intracranial atherosclerosis, sequelae of intracranial thromboemboli, and intracranial vasculitis. 4. No acute intracranial abnormality. Stable CT appearance of the brain since the MRI yesterday. Electronically Signed   By: Genevie Ann M.D.   On: 08/09/2017 10:16   Ct  Angio Neck W Or Wo Contrast  Result Date: 08/09/2017 CLINICAL DATA:  52 year old female with right side weakness found to have subacute left basal ganglia infarct. Chronic left ACA A2 occlusion. Moderate bilateral M2 MCA stenosis suspected on MRI yesterday. EXAM: CT ANGIOGRAPHY HEAD AND NECK TECHNIQUE: Multidetector CT imaging of the head and neck was performed using the standard protocol during bolus administration of intravenous contrast. Multiplanar CT image reconstructions and MIPs were obtained to evaluate the vascular anatomy. Carotid stenosis measurements  (when applicable) are obtained utilizing NASCET criteria, using the distal internal carotid diameter as the denominator. CONTRAST:  15mL ISOVUE-370 IOPAMIDOL (ISOVUE-370) INJECTION 76% COMPARISON:  Brain MRI and MRA 08/08/2017, and earlier. FINDINGS: CT HEAD Brain: Hypodensity in the left corona radiata corresponding to the MRI described subacute lesion yesterday. More subtle chronic hypodensity elsewhere related to prior ischemia in the left ACA territory, bilateral basal ganglia, lateral left occipital lobe, left cerebellar peduncle. No midline shift, ventriculomegaly, mass effect, evidence of mass lesion, intracranial hemorrhage or evidence of cortically based acute infarction. Calvarium and skull base: Negative. Paranasal sinuses: Clear. Orbits: No acute orbit or scalp soft tissue findings. CTA NECK Skeleton: Absent maxillary and posterior mandible dentition. No acute osseous abnormality identified. Cervical spine disc and endplate degeneration. Upper chest: Negative lung apices. Superior mediastinal lipomatosis without lymphadenopathy. Other neck: Subcentimeter hypodense left thyroid nodule does not meet consensus criteria for ultrasound follow-up. Otherwise negative. No cervical lymphadenopathy. Aortic arch: 3 vessel arch configuration with no arch atherosclerosis. Right carotid system: Negative brachiocephalic artery and right CCA origin. Mild right CCA tortuosity. There is confluent calcified plaque at the posterior right ICA origin and bulge, but no stenosis results. Mild cervical right ICA tortuosity. Left carotid system: Normal left CCA origin. Mild left CCA tortuosity. Normal left carotid bifurcation. Tortuous cervical left ICA without stenosis. Vertebral arteries: Minimal proximal right subclavian artery soft plaque without stenosis. Normal right vertebral artery origin. Mild right vertebral artery tortuosity with no stenosis to the skull base. Tortuous proximal left subclavian artery without plaque or  stenosis. Normal left vertebral artery origin. Patent left vertebral artery to the skull base without stenosis. CTA HEAD Posterior circulation: Patent distal vertebral arteries without stenosis. The left is mildly dominant. There is minimal calcified plaque in the left V4 segment. Normal right PICA origin. As before, the left PICA is diminutive or absent and the left AICA is diminutive. The right AICA is normal. Patent vertebrobasilar junction and basilar artery without stenosis. Patent AICA and SCA origins. Normal left PCA origin. Fetal type right PCA origin. Diminutive or absent left posterior communicating artery. The proximal PCAs are mildly irregular. The distal PCA branches are mildly irregular. Anterior circulation: Patent ICA siphons with no atherosclerosis or stenosis. Normal ophthalmic and right posterior communicating artery origins. Patent carotid termini. Normal MCA and ACA origins. Codominant A1 segments. Normal anterior communicating artery. Chronic occlusion of the proximal left ACA A2 is unchanged since 2018 (series 16, image 25). The right ACA and its branches are within normal limits. The left MCA M1 segment and bifurcation appear within normal limits. There is moderate to severe irregularity and stenosis of a left MCA M2 middle sylvian branch demonstrated on series 16, image 32 which is new since 2018 and stable to the MRA yesterday. The other MCA branches scratched at the other left MCA branches are within normal limits. The right MCA M1 segment and bifurcation appear normal. There is a similar segment of moderate irregularity and stenosis in a posterior right MCA M2  branch seen on series 16, image 52 which is more apparent than on the MRA yesterday. This also appears new since 2018. There is otherwise mild right MCA branch irregularity. Venous sinuses: Patent. Anatomic variants: None. Delayed phase: No abnormal enhancement identified. Review of the MIP images confirms the above findings  IMPRESSION: 1. Minimal atherosclerosis from the arch to the skull base - limited to the proximal right ICA with no stenosis. Otherwise normal appearance of the great vessel origins, and the carotid and vertebral arteries. 2. However, intracranially there is chronic left ACA A2 occlusion. And bilateral MCA M2 branch segments of moderate to severe irregularity (greater on the left) which has developed since a May 2018 MRA (see series 16, images 32 and 15). 3. The main differential considerations include disproportionate intracranial atherosclerosis, sequelae of intracranial thromboemboli, and intracranial vasculitis. 4. No acute intracranial abnormality. Stable CT appearance of the brain since the MRI yesterday. Electronically Signed   By: Genevie Ann M.D.   On: 08/09/2017 10:16   Mr Angiogram Head Wo Contrast  Result Date: 08/08/2017 CLINICAL DATA:  RIGHT-sided weakness beginning last night, intermittent diplopia and decreased LEFT hearing with severe headache. History of hypertension, stroke. EXAM: MRI HEAD WITHOUT CONTRAST MRA HEAD WITHOUT CONTRAST TECHNIQUE: Multiplanar, multiecho pulse sequences of the brain and surrounding structures were obtained without intravenous contrast. Angiographic images of the head were obtained using MRA technique without contrast. COMPARISON:  MRI/MRA head Sep 27, 2016 FINDINGS: MRI HEAD FINDINGS INTRACRANIAL CONTENTS: Very faint reduced diffusion associated with LEFT basal ganglia infarct, predominant T2 shine through and faint susceptibility artifact. No reduced diffusion to suggest acute ischemia. Patchy FLAIR T2 hyperintense signal LEFT posterior temporal lobe similar to prior examination. Old bilateral basal ganglia lacunar infarcts. Old LEFT brachium pontis to cerebellar hemisphere infarct. Minimal mesial LEFT frontal lobe encephalomalacia. Scattered subcentimeter supratentorial white matter hypodensities. No midline shift, mass effect or masses. No abnormal extra-axial fluid  collections. VASCULAR: Normal major intracranial vascular flow voids present at skull base. SKULL AND UPPER CERVICAL SPINE: No abnormal sellar expansion. No suspicious calvarial bone marrow signal. Craniocervical junction maintained. SINUSES/ORBITS: The mastoid air-cells and included paranasal sinuses are well-aerated.The included ocular globes and orbital contents are non-suspicious. OTHER: None. MRA HEAD FINDINGS ANTERIOR CIRCULATION: Normal flow related enhancement of the included cervical, petrous, cavernous and supraclinoid internal carotid arteries. Patent fenestrated anterior communicating artery. 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 Moderate stenosis LEFT M2 segment (superior division) and RIGHT M2 segment (inferior division) worse than prior MRI. Anterior and middle cerebral arteries are otherwise patent. No emergent large vessel occlusion, aneurysm. POSTERIOR CIRCULATION: Nearly codominant vertebral arteries. Basilar artery is patent, with normal flow related enhancement of the main branch vessels. Patent posterior cerebral arteries. Fetal origin RIGHT posterior cerebral artery. No large vessel occlusion, flow limiting stenosis,  aneurysm. ANATOMIC VARIANTS: None. Source images and MIP images were reviewed. IMPRESSION: MRI HEAD: 1. New early subacute LEFT basal ganglia infarct. 2. Old bilateral basal ganglia lacunar infarcts. 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 Old LEFT frontal/ACA and old LEFT temporal/MCA territory infarcts. 3. Mild chronic small vessel ischemic disease. MRA HEAD: 1. No emergent large vessel occlusion. Chronically occluded LEFT A2 segment. 2. Moderate stenosis bilateral M2 segments. Electronically Signed   By: Elon Alas M.D.   On: 08/08/2017 19:30   Mr Brain Wo Contrast  Result Date: 08/08/2017 CLINICAL DATA:  RIGHT-sided weakness beginning last night, intermittent diplopia and decreased LEFT hearing with severe headache. History of  hypertension, stroke. EXAM: MRI HEAD  WITHOUT CONTRAST MRA HEAD WITHOUT CONTRAST TECHNIQUE: Multiplanar, multiecho pulse sequences of the brain and surrounding structures were obtained without intravenous contrast. Angiographic images of the head were obtained using MRA technique without contrast. COMPARISON:  MRI/MRA head Sep 27, 2016 FINDINGS: MRI HEAD FINDINGS INTRACRANIAL CONTENTS: Very faint reduced diffusion associated with LEFT basal ganglia infarct, predominant T2 shine through and faint susceptibility artifact. No reduced diffusion to suggest acute ischemia. Patchy FLAIR T2 hyperintense signal LEFT posterior temporal lobe similar to prior examination. Old bilateral basal ganglia lacunar infarcts. Old LEFT brachium pontis to cerebellar hemisphere infarct. Minimal mesial LEFT frontal lobe encephalomalacia. Scattered subcentimeter supratentorial white matter hypodensities. No midline shift, mass effect or masses. No abnormal extra-axial fluid collections. VASCULAR: Normal major intracranial vascular flow voids present at skull base. SKULL AND UPPER CERVICAL SPINE: No abnormal sellar expansion. No suspicious calvarial bone marrow signal. Craniocervical junction maintained. SINUSES/ORBITS: The mastoid air-cells and included paranasal sinuses are well-aerated.The included ocular globes and orbital contents are non-suspicious. OTHER: None. MRA HEAD FINDINGS ANTERIOR CIRCULATION: Normal flow related enhancement of the included cervical, petrous, cavernous and supraclinoid internal carotid arteries. Patent fenestrated anterior communicating artery. 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 Moderate stenosis LEFT M2 segment (superior division) and RIGHT M2 segment (inferior division) worse than prior MRI. Anterior and middle cerebral arteries are otherwise patent. No emergent large vessel occlusion, aneurysm. POSTERIOR CIRCULATION: Nearly codominant vertebral arteries. Basilar artery is  patent, with normal flow related enhancement of the main branch vessels. Patent posterior cerebral arteries. Fetal origin RIGHT posterior cerebral artery. No large vessel occlusion, flow limiting stenosis,  aneurysm. ANATOMIC VARIANTS: None. Source images and MIP images were reviewed. IMPRESSION: MRI HEAD: 1. New early subacute LEFT basal ganglia infarct. 2. Old bilateral basal ganglia lacunar infarcts. 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 Old LEFT frontal/ACA and old LEFT temporal/MCA territory infarcts. 3. Mild chronic small vessel ischemic disease. MRA HEAD: 1. No emergent large vessel occlusion. Chronically occluded LEFT A2 segment. 2. Moderate stenosis bilateral M2 segments. Electronically Signed   By: Elon Alas M.D.   On: 08/08/2017 19:30   2D echocardiogram - Left ventricle: The cavity size was normal. Wall thickness was   normal. Systolic function was normal. The estimated ejection   fraction was in the range of 60% to 65%. Wall motion was normal;   there were no regional wall motion abnormalities. Impressions: - Normal LV function; no significant valvular disease.   PHYSICAL EXAM General: Appears well-developed and well-nourished.  Psych: Affect appropriate to situation Eyes: No scleral injection HENT: No OP obstrucion Head: Normocephalic.  Cardiovascular: Normal rate and regular rhythm.  Respiratory: Effort normal and breath sounds normal to anterior ascultation GI: Soft. No distension. There is no tenderness.  Skin: WDI  Neurological Examination Mental Status: Alert, oriented, thought content appropriate.  Speech fluent without evidence of aphasia. Able to follow 3 step commands without difficulty. Cranial Nerves: II: can count fingers in all fields.  III,IV, VI: ptosis not present, extra-ocular motions intact bilaterally, pupils equal, round, reactive to light and accommodation. Had 1-2 beat nystagmus to left. Denies double vision V,VII: smile symmetric,  facial light touch sensation normal bilaterally VIII: hearing normal bilaterally IX,X: uvula rises symmetrically XI: bilateral shoulder shrug XII: midline tongue extension Motor: Right upper extremity drift. Right :  Upper extremity   5/5  Left:     Upper extremity   5/5             Lower extremity   5/5                                                  Lower extremity   5/5 Giveway weakness. Tone and bulk:normal tone throughout; no atrophy noted Sensory: Pinprick and light touch intact throughout, bilaterally Plantars: Right: downgoing                                Left: downgoing Cerebellar: normal finger-to-nose, normal rapid alternating movements and normal heel-to-shin test Gait: normal gait and station   ASSESSMENT/PLAN Ms. KAYLYNNE ANDRES is a 52 y.o. female with history of multiple strokes, hypertension, hyperlipidemia and fibromyalgia presenting with sudden onset of hearing loss in her right ear, dizziness and double vision.   Stroke-like symptoms, consider vasculitis. Hx HAs.  MRI head new early subacute left basal ganglia infarct read by radiologist not appreciated by neurologist (Dr. Erlinda Hong). Small vessel disease.   MRA head no ELVO. Chronically occluded L A2. Moderate stenosis B M2.  CT head No acute abnormality  CTA head L A2 occlusion, B M2 severe irregularity new since May 2018.  CTA neck essentially unremarkable  2D Echo  EF 60-65%. No source of embolus   Carotid Doppler canceled  Loop recorder negative for atrial fibrillation or other finding since last interrogation  LDL 52  HgbA1c 5.9  Repeat hypercoagulable panel  LP under fluoro pending   Lovenox 40 mg daily for VTE prophylaxis  Fall precautions  Diet heart healthy/carb modified Room service appropriate? Yes; Fluid consistency: Thin  clopidogrel 75 mg daily prior to admission, now on clopidogrel 75 mg daily. Continue Plavix at discharge  Patient counseled to  be compliant with her antithrombotic medications  Ongoing aggressive stroke risk factor management  Therapy recommendations:  no therapy needs  Disposition:  return home with husband  Hypertension  Stable . Permissive hypertension (OK if < 220/120) but gradually normalize in 5-7 days . Long-term BP goal normotensive  Other Stroke Risk Factors  Obesity, Body mass index is 39.72 kg/m., recommend weight loss, diet and exercise as appropriate   Hx stroke/TIA  09/2016 - Stroke:   L ACA infarcts embolic secondary to unknown source. Treated with aspirin and Plavix 3 months then Plavix alone.  Loop recorder placed.   2011 - stroke  Family hx stroke (father and mother)  Hx headaches  Other Active Problems  Fibromyalgia  COPD  Depression and anxiety  Hospital day # [place], MSN, APRN, ANVP-BC, AGPCNP-BC Advanced Practice Stroke Nurse [place] for Schedule & Pager information 08/09/2017 4:24 PM   ATTENDING NOTE: I reviewed above note and agree with the assessment and plan. I have made any additions or clarifications directly to the above note. Pt was seen and examined.   52 year old female with history of hypertension, obesity, stroke in 2011 and 09/2016 admitted for  double vision and right ear hearing loss for 5 minutes.   In 09/2016 she had a right-sided weakness admitted for left ACA infarct.  MRI also showed old bilateral BG infarct, old left temporal and left cerebellum infarct.  MRI showed left atrial occlusion.  No DVT.  EF 60-65%.  Carotid Doppler negative.  Hypercoagulable workup negative.  TEE negative and loop recorder placed.  LDL 48 and A1c 5.7.  Discharged on dual antiplatelet.  This time she stated that she had a sudden onset horizontal double vision in both directions, associated with right ear hearing loss for 5 minutes.  She also failed her residual right-sided weakness from last stroke also worsened.  MRI report left BG acute  infarct.  However, on my reviewing of the images, did not appreciate any acute infarct on MRI.  Loop recorder interrogated no A. fib.  MRA showed chronic left A2 occlusion.  CTA head neck confirmed left atrial occlusion, but also showed bilateral M2 moderate to severe stenosis.  Concerning for advanced atherosclerosis versus vasculitis. LDL 52 and A1c 5.9.  Repeat hypercoagulable labs are pending.  EF 60-65%.  Patient does have stroke risk factors including hypertension, history of stroke and obesity.  However would like to perform LP to rule out vasculitis.  However, patient has been on Plavix PTA and on admission.  Will likely need outpatient LP schedule.  Will recommend dual antiplatelet with aspirin 325 and Plavix for 3 months due to intracranial stenosis and then Plavix alone.  However Plavix can be temporary discontinued for LP as outpatient.   Rosalin Hawking, MD PhD Stroke Neurology 08/09/2017 9:40 PM   To contact Stroke Continuity provider, please refer to [email or web address] After hours, contact General Neurology

## 2017-08-09 NOTE — Evaluation (Signed)
Speech Language Pathology Evaluation Patient Details Name: Lynn Rollins MRN: [id number] DOB: 1966/01/02 Today's Date: 08/09/2017 Time: 7782-4235 SLP Time Calculation (min) (ACUTE ONLY): 16 min  Problem List:  Patient Active Problem List   Diagnosis Date Noted  . COPD with asthma ([place]) 08/09/2017  . CVA (cerebral vascular accident) ([place]) 08/08/2017  . Chronic pain 05/29/2017  . History of stroke   . Cerebral embolism with cerebral infarction 09/27/2016  . Acute ischemic left ACA stroke ([place]) 09/27/2016  . Acute bronchitis 09/27/2016  . HTN (hypertension) 09/27/2016   Past Medical History:  Past Medical History:  Diagnosis Date  . Anxiety   . Asthma   . Brain lesion 2011-2012   MRI  . Chronic fatigue   . COPD (chronic obstructive pulmonary disease) ([place])   . Depression   . Fibromyalgia   . Hypertension   . Stroke [facility])    Past Surgical History:  Past Surgical History:  Procedure Laterality Date  . ABDOMINAL HYSTERECTOMY    . CHOLECYSTECTOMY    . LOOP RECORDER INSERTION N/A 10/01/2016   Procedure: Loop Recorder Insertion;  Surgeon: Deboraha Sprang, MD;  Location: [place] [facility];  Service: Cardiovascular;  Laterality: N/A;  . TEE WITHOUT CARDIOVERSION N/A 10/01/2016   Procedure: TRANSESOPHAGEAL ECHOCARDIOGRAM (TEE);  Surgeon: Acie Fredrickson Wonda Cheng, MD;  Location: [facility] ENDOSCOPY;  Service: Cardiovascular;  Laterality: N/A;  . TUBAL LIGATION     HPI:  Lynn Rollins an 52 y.o.femalewith a history of multiple strokes, hypertension, hyperlipidemia,fibromyalgia who presents to the emergency room after having sudden onset loss of hearing in her right ear, dizziness and double vision lasting for 5-10 minutes on Wednesday followed by another episode that was less severe lasting several hours.She decided to come to the emergency room the following day and most of her symptoms resolved on arrival. An MRI brain was done showed a subacute left basal ganglia infarct. Patient has a  complex history of multiple strokes. Her first stroke was apparently in 2011 presenting with numbness. She apparently had 2 more strokes. Her last stroke was in June 2018 where she developed left ACA infarction secondary due to occlusion. She also has bilateral basal ganglia infarct, old temporal lobe infarct and left cerebellar peduncle infarction.    Assessment / Plan / Recommendation Clinical Impression  Pt presents with baseline cognitive deficits in memory. Pt's husband present and confirms that memory defiicts are from previous strokes, pt able to list ways that she compensates within home environment (such as making list, family checking on her, settting timers etc). No further acture needs were identified, SLP finished education and recommend pt have increased supervision at initial discharge d/t vision deficits and recovery from sleep deprivation. ST to sign off.    SLP Assessment  SLP Recommendation/Assessment: Patient does not need any further Speech Lanaguage Pathology Services SLP Visit Diagnosis: Cognitive communication deficit (R41.841)    Follow Up Recommendations  24 hour supervision/assistance    Frequency and Duration           SLP Evaluation Cognition  Overall Cognitive Status: History of cognitive impairments - at baseline Arousal/Alertness: Awake/alert Orientation Level: Oriented X4 Attention: Selective Selective Attention: Appears intact Memory: Impaired Memory Impairment: Decreased short term memory(baseline deficits) Awareness: Appears intact       Comprehension  Auditory Comprehension Overall Auditory Comprehension: Appears within functional limits for tasks assessed Yes/No Questions: Within Functional Limits Visual Recognition/Discrimination Discrimination: Not tested(blurred vision) Reading Comprehension Reading Status: Not tested(d/t blurred vision)    Expression  Expression Primary Mode of Expression: Verbal Verbal Expression Overall Verbal  Expression: Appears within functional limits for tasks assessed Written Expression Dominant Hand: Right Written Expression: Not tested(pt too tired)   Oral / Motor  Motor Speech Overall Motor Speech: Appears within functional limits for tasks assessed   GO                    Thurman Sarver 08/09/2017, 4:58 PM

## 2017-08-09 NOTE — Consult Note (Signed)
Requesting Physician: Dr. Tamala Julian     Chief Complaint: Hearing loss, double vision and dizziness  History obtained from: Patient and Chart    HPI:                                                                                                                                       Lynn Rollins is an 52 y.o. female with a history of multiple strokes, hypertension, hyperlipidemia,fibromyalgia who presents to the emergency room after having sudden onset loss of hearing in her right ear, dizziness and double vision lasting for 5-10 minutes on Wednesday followed by another episode that was less severe lasting several hours.She decided to come to the emergency room the following day and most of her symptoms resolved on arrival. An MRI brain was done showed a subacute left basal ganglia infarct.  Patient has a complex history of multiple strokes. Her first stroke was apparently in 2011 presenting with numbness. She apparently had 2 more strokes. Her last stroke was in June 2018 where she developed left ACA infarction secondary due to occlusion. She also has bilateral basal ganglia infarct, old temporal lobe infarct and left cerebellar peduncle infarction.  She has had an extensive evaluation for strokes during last admission Autoimmune workup was negative and hypercoagulable panel was also negative.  Loop recorder was placed so far has not revealed atrial fibrillation, most recently checked on 05/30/17. She has had both TTE and TEE which was negative. Intracranial vessel imaging did not show significant stenosis apart from  A2 occlusion. She was discharged on dual antiplatelet therapy for 3 months and subsequently has been on aspirin.   Date last known well: 08/07/17 tPA Given:no, outside window     Past Medical History:  Diagnosis Date  . Anxiety   . Asthma   . Brain lesion 2011-2012   MRI  . Chronic fatigue   . COPD (chronic obstructive pulmonary disease) ([place])   . Depression   .  Fibromyalgia   . Hypertension   . Stroke [facility])     Past Surgical History:  Procedure Laterality Date  . ABDOMINAL HYSTERECTOMY    . CHOLECYSTECTOMY    . LOOP RECORDER INSERTION N/A 10/01/2016   Procedure: Loop Recorder Insertion;  Surgeon: Deboraha Sprang, MD;  Location: [place] [facility];  Service: Cardiovascular;  Laterality: N/A;  . TEE WITHOUT CARDIOVERSION N/A 10/01/2016   Procedure: TRANSESOPHAGEAL ECHOCARDIOGRAM (TEE);  Surgeon: Acie Fredrickson Wonda Cheng, MD;  Location: [facility] ENDOSCOPY;  Service: Cardiovascular;  Laterality: N/A;  . TUBAL LIGATION      Family History  Problem Relation Age of Onset  . Stroke Mother   . Parkinson's disease Father   . Heart disease Father   . Stroke Father   . Multiple sclerosis Brother    Social History:  reports that she has never smoked. She has never used smokeless tobacco. She reports that she does not drink  alcohol or use drugs.  Allergies:  Allergies  Allergen Reactions  . Tape Rash and Other (See Comments)    Patient has Fibromyalgia and cannot tolerate medical tape and it's removal  . Celexa [Citalopram Hydrobromide] Other (See Comments)    "Made me cry and made me worse"  . Statins Other (See Comments)    Muscles cramp    Medications:                                                                                                                        I reviewed home medications   ROS:                                                                                                                                     14 systems reviewed and negative except above    Examination:                                                                                                      General: Appears well-developed and well-nourished.  Psych: Affect appropriate to situation Eyes: No scleral injection HENT: No OP obstrucion Head: Normocephalic.  Cardiovascular: Normal rate and regular rhythm.  Respiratory: Effort normal and breath sounds  normal to anterior ascultation GI: Soft.  No distension. There is no tenderness.  Skin: WDI   Neurological Examination Mental Status: Alert, oriented, thought content appropriate.  Speech fluent without evidence of aphasia. Able to follow 3 step commands without difficulty. Cranial Nerves: II: Visual fields grossly normal,  III,IV, VI: ptosis not present, extra-ocular motions intact bilaterally, pupils equal, round, reactive to light and accommodation V,VII: smile symmetric, facial light touch sensation normal bilaterally VIII: hearing normal bilaterally IX,X: uvula rises symmetrically XI: bilateral shoulder shrug XII: midline tongue extension Motor: Right : Upper extremity   5/5    Left:     Upper extremity   5/5  Lower extremity   5/5     Lower extremity   5/5  Tone and bulk:normal tone throughout; no atrophy noted Sensory: Pinprick and light touch intact throughout, bilaterally Deep Tendon Reflexes: 2+ and symmetric throughout Plantars: Right: downgoing   Left: downgoing Cerebellar: normal finger-to-nose, normal rapid alternating movements and normal heel-to-shin test Gait: normal gait and station     Lab Results: Basic Metabolic Panel: Recent Labs  Lab 08/08/17 1547 08/08/17 1600  NA 138 140  K 4.3 4.2  CL 105 103  CO2 23  --   GLUCOSE 132* 132*  BUN 11 12  CREATININE 0.70 0.60  CALCIUM 9.7  --     CBC: Recent Labs  Lab 08/08/17 1513 08/08/17 1547 08/08/17 1600  WBC 8.9  --   --   NEUTROABS  --  5.6  --   HGB 13.7  --  14.3  HCT 41.5  --  42.0  MCV 89.1  --   --   PLT 306  --   --     Coagulation Studies: Recent Labs    08/08/17 1547  LABPROT 13.1  INR 1.00    Imaging: Mr Angiogram Head Wo Contrast  Result Date: 08/08/2017 CLINICAL DATA:  RIGHT-sided weakness beginning last night, intermittent diplopia and decreased LEFT hearing with severe headache. History of hypertension, stroke. EXAM: MRI HEAD WITHOUT CONTRAST MRA HEAD WITHOUT CONTRAST  TECHNIQUE: Multiplanar, multiecho pulse sequences of the brain and surrounding structures were obtained without intravenous contrast. Angiographic images of the head were obtained using MRA technique without contrast. COMPARISON:  MRI/MRA head Sep 27, 2016 FINDINGS: MRI HEAD FINDINGS INTRACRANIAL CONTENTS: Very faint reduced diffusion associated with LEFT basal ganglia infarct, predominant T2 shine through and faint susceptibility artifact. No reduced diffusion to suggest acute ischemia. Patchy FLAIR T2 hyperintense signal LEFT posterior temporal lobe similar to prior examination. Old bilateral basal ganglia lacunar infarcts. Old LEFT brachium pontis to cerebellar hemisphere infarct. Minimal mesial LEFT frontal lobe encephalomalacia. Scattered subcentimeter supratentorial white matter hypodensities. No midline shift, mass effect or masses. No abnormal extra-axial fluid collections. VASCULAR: Normal major intracranial vascular flow voids present at skull base. SKULL AND UPPER CERVICAL SPINE: No abnormal sellar expansion. No suspicious calvarial bone marrow signal. Craniocervical junction maintained. SINUSES/ORBITS: The mastoid air-cells and included paranasal sinuses are well-aerated.The included ocular globes and orbital contents are non-suspicious. OTHER: None. MRA HEAD FINDINGS ANTERIOR CIRCULATION: Normal flow related enhancement of the included cervical, petrous, cavernous and supraclinoid internal carotid arteries. Patent fenestrated anterior communicating artery. 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 Moderate stenosis LEFT M2 segment (superior division) and RIGHT M2 segment (inferior division) worse than prior MRI. Anterior and middle cerebral arteries are otherwise patent. No emergent large vessel occlusion, aneurysm. POSTERIOR CIRCULATION: Nearly codominant vertebral arteries. Basilar artery is patent, with normal flow related enhancement of the main branch vessels. Patent  posterior cerebral arteries. Fetal origin RIGHT posterior cerebral artery. No large vessel occlusion, flow limiting stenosis,  aneurysm. ANATOMIC VARIANTS: None. Source images and MIP images were reviewed. IMPRESSION: MRI HEAD: 1. New early subacute LEFT basal ganglia infarct. 2. Old bilateral basal ganglia lacunar infarcts. 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 Old LEFT frontal/ACA and old LEFT temporal/MCA territory infarcts. 3. Mild chronic small vessel ischemic disease. MRA HEAD: 1. No emergent large vessel occlusion. Chronically occluded LEFT A2 segment. 2. Moderate stenosis bilateral M2 segments. Electronically Signed   By: Elon Alas M.D.   On: 08/08/2017 19:30   Mr Brain Wo Contrast  Result Date: 08/08/2017 CLINICAL DATA:  RIGHT-sided weakness beginning last night, intermittent diplopia and decreased LEFT  hearing with severe headache. History of hypertension, stroke. EXAM: MRI HEAD WITHOUT CONTRAST MRA HEAD WITHOUT CONTRAST TECHNIQUE: Multiplanar, multiecho pulse sequences of the brain and surrounding structures were obtained without intravenous contrast. Angiographic images of the head were obtained using MRA technique without contrast. COMPARISON:  MRI/MRA head Sep 27, 2016 FINDINGS: MRI HEAD FINDINGS INTRACRANIAL CONTENTS: Very faint reduced diffusion associated with LEFT basal ganglia infarct, predominant T2 shine through and faint susceptibility artifact. No reduced diffusion to suggest acute ischemia. Patchy FLAIR T2 hyperintense signal LEFT posterior temporal lobe similar to prior examination. Old bilateral basal ganglia lacunar infarcts. Old LEFT brachium pontis to cerebellar hemisphere infarct. Minimal mesial LEFT frontal lobe encephalomalacia. Scattered subcentimeter supratentorial white matter hypodensities. No midline shift, mass effect or masses. No abnormal extra-axial fluid collections. VASCULAR: Normal major intracranial vascular flow voids present at skull base. SKULL AND  UPPER CERVICAL SPINE: No abnormal sellar expansion. No suspicious calvarial bone marrow signal. Craniocervical junction maintained. SINUSES/ORBITS: The mastoid air-cells and included paranasal sinuses are well-aerated.The included ocular globes and orbital contents are non-suspicious. OTHER: None. MRA HEAD FINDINGS ANTERIOR CIRCULATION: Normal flow related enhancement of the included cervical, petrous, cavernous and supraclinoid internal carotid arteries. Patent fenestrated anterior communicating artery. 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 Moderate stenosis LEFT M2 segment (superior division) and RIGHT M2 segment (inferior division) worse than prior MRI. Anterior and middle cerebral arteries are otherwise patent. No emergent large vessel occlusion, aneurysm. POSTERIOR CIRCULATION: Nearly codominant vertebral arteries. Basilar artery is patent, with normal flow related enhancement of the main branch vessels. Patent posterior cerebral arteries. Fetal origin RIGHT posterior cerebral artery. No large vessel occlusion, flow limiting stenosis,  aneurysm. ANATOMIC VARIANTS: None. Source images and MIP images were reviewed. IMPRESSION: MRI HEAD: 1. New early subacute LEFT basal ganglia infarct. 2. Old bilateral basal ganglia lacunar infarcts. 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 Old LEFT frontal/ACA and old LEFT temporal/MCA territory infarcts. 3. Mild chronic small vessel ischemic disease. MRA HEAD: 1. No emergent large vessel occlusion. Chronically occluded LEFT A2 segment. 2. Moderate stenosis bilateral M2 segments. Electronically Signed   By: Elon Alas M.D.   On: 08/08/2017 19:30     ASSESSMENT AND PLAN    Left basal ganglia subacute infarct TIA   I do not suspect patient's presenting symptoms of sudden onset hearing loss, dizziness and double vision are related to the subacute stroke. Her symptoms mainly localizes to posterior circulation such as  AICA.  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  Plan Repeat APLA, Lupus anticoagulant ( previously negative) Audiology exam for SNHL Ophthalmological exam/Retinal exam Lumbar Puncture Consider diagnostic angiogram  #Transthoracic Echo  # continue ASA #Start or continue Atorvastatin 80 mg/other high intensity statin # BP goal: permissive HTN upto 220/110 mmHG # HBAIC and Lipid profile # Telemetry monitoring # Frequent neuro checks # stroke swallow screen  Please page stroke NP  Or  PA  Or MD from 8am -4 pm  as this patient from this time will be  followed by the stroke.   You can look them up on [email or web address]  Password [facility]   Belma Dyches [facility] Pager Number [phone]

## 2017-08-10 DIAGNOSIS — R531 Weakness: Secondary | ICD-10-CM

## 2017-08-10 LAB — SEDIMENTATION RATE: SED RATE: 22 mm/h (ref 0–22)

## 2017-08-10 LAB — C-REACTIVE PROTEIN

## 2017-08-10 NOTE — Progress Notes (Signed)
Patient ready for discharge to home; discharge instructions given and reviewed; no Rx's; patient discharged out via wheelchair; accompanied home by her husband.

## 2017-08-10 NOTE — Discharge Summary (Signed)
Physician Discharge Summary  Lynn Rollins:[id number] DOB: March 21, 1966 DOA: 08/08/2017  PCP: Lynn Dress, MD  Admit date: 08/08/2017 Discharge date: 08/10/2017  Admitted From: Home  Disposition:  Home   Recommendations for Outpatient Follow-up:  1. Follow up with PCP in 1-2 weeks 2. Please follow up with Lynn Rollins, Neurology in 4 weeks 3. Please obtain lumbar puncture per further Neurology evaluation 4. Please follow up on the following pending results: Antiphospholipid antibody panel, ANCA titers, RF and anti-CCP antibodies  Home Health: Yes  Equipment/Devices: 3 in 1  Discharge Condition: Good  CODE STATUS: FULL Diet recommendation: Heart healthy, low fat  Brief/Interim Summary: This Lynn Rollins is a 52 year old F with HTN, history CVA, asthma/COPD, fibromyalgia, and depression who presents with dizziness, double vision, right ear hearing loss, headache, and right-sided weakness.  She called her Neurologist's office who recommended she be seen in the ER. MRI brain showed what was read as a subacute LEFT basal ganglia infarct and she was admitted to the hospitalist service.        Discharge Diagnoses:  Principal Problem: Possible  CVA (cerebral vascular accident) (Lynn Rollins)   History of stroke MRI was read initially as "New early subacute LEFT basal ganglia infarct".  Neurology felt this was equivocal, likely not infarct on MRI.  Of note, her CTA head showed minimal large vessel stenosis/irregularities from the arch to the neck, but out of proportion atherosclerosis of the intracranial vessels, bilaterally, with a differential to include simply disporportionate intracranial atherosclerosis, but also sequelae of embolic disease or intracranial vasculitis. Of note, last June, TEE, LE dopplers negative.  Loop recorder placed at that time, no tachyarrhythmias noted recently.  Echo repeated this hospitalization and negative.  With regard to vasculitides, the patient has had no  other systemic signs of vasculitis (no rash, no skin lesions, no oral or genital ulcers, no joint pain or swelling, no anemia or leukopenia) and ESR and CRP were normal. -APLA was repeated, is pending -Plavix was continued -LP was considered but deferred to outpatient setting due to Plavix use -Patient will follow up with primary Neurologist Lynn Rollins for consideration of scheduling LP    HTN (hypertension) Bradycardia The patient's blood pressure medicines were continued on hospital admission, and on hospital day two she was actually hypotensive and with HR in the 50s, but asymptomatic.  Orthostatics were negative, and BP was >110/50 on manual check, and she felt asymptomatic, and so her clonidine was stopped.  Amlodipine, furosemide and lisinopril were also stopped.  She was given instructions on close monitoring of BP, restarting her BP meds and follow up with her PCP.  Fatigue The patient complained primarily of severe fatigue throughout her stay.  This persisted despite holding her Valium.  Considerations for cause included her opiates, her Fibromyalgia, deconditioning from obesity, or side effects from medications.      Chronic pain The patietn was continued on her home oxycodone.  She was counseled on the harms of concurrent use of opiates and benzodiazepines.    COPD with asthma ([place]) No active disease    Morbid obesity [facility])    Discharge Instructions  Discharge Instructions    Ambulatory referral to Neurology   Complete by:  As directed    Pt will follow up with Lynn Rollins at [facility] in about 4 weeks. Thanks.   Diet - low sodium heart healthy   Complete by:  As directed    Discharge instructions   Complete by:  As directed  From Dr. Loleta Rollins: You were admitted to the hospital for new double vision, weakness and fatigue.   Your MRI suggested a possible stroke, although Dr. Erlinda Rollins believes from his review that there may not have been a stroke at all, and that your symptoms  may be from either something benign (not serious) like medication side effects or a flu.  Another possibility is that it is from another illness, like an autoimmune illness.  This is unlikely, but would require further testing with a lumbar puncture (LP).  The LP should be performed after you have been off Plavix and aspirin for 5 days.   Please call Dr. Gladstone Rollins office to arrange a follow up appointment in the next month, and they can discuss with you if you should pursue this LP and help you schedule it, and then let you know the date so you can stop Plavix and aspirin.  In the meantime: -Continue your Plavix 75 mg daily -It is not necessary to take aspirin in addition.  For your blood pressure: This was quite low in the hospital, I believe because we gave you all the blood pressure medicines you are prescribed, and you weren't used to them. -Stop amlodipine, clonidine and lisinopril -Take your blood pressure every day until you see Lynn Rollins -If your blood pressure is over 140 for the top number or 100 for the bottom number, restart your lisinopril and keep checking your blood pressure daily -If your blood pressure is STILL over 140 for the top number or 100 for the bottom number after restarting lisinopril, restart your furosemide, -If your blood pressure is STILL over 140 for the top number or 100 for the bottom number after restarting lisinopril and furosemide, restart your clonidine only 2 times a day, and call Dr. Denton Rollins office -If your blood pressure is STILL over 140 for the top number and 100 for the bottom number after restarting both lisinopril and furosemide and clonidine, restart the amlodipine at 5 mg per day     For your hearing loss:  Ask Lynn Rollins about a referral to an [facility] and Throat doctor, or about an audiology referral   Increase activity slowly   Complete by:  As directed      Allergies as of 08/10/2017      Reactions   Tape Rash, Other (See Comments)    Patient has Fibromyalgia and cannot tolerate medical tape and it's removal   Celexa [citalopram Hydrobromide] Other (See Comments)   "Made me cry and made me worse"   Statins Other (See Comments)   Muscles cramp      Medication List    STOP taking these medications   amLODipine 10 MG tablet Commonly known as:  NORVASC   cloNIDine 0.1 MG tablet Commonly known as:  CATAPRES   furosemide 40 MG tablet Commonly known as:  LASIX   lisinopril 40 MG tablet Commonly known as:  PRINIVIL,ZESTRIL     TAKE these medications   albuterol 108 (90 Base) MCG/ACT inhaler Commonly known as:  PROVENTIL HFA;VENTOLIN HFA Inhale 2 puffs into the lungs every 4 (four) hours as needed for wheezing.   BREO ELLIPTA 200-25 MCG/INH Aepb Generic drug:  fluticasone furoate-vilanterol Inhale 1 puff into the lungs daily.   clopidogrel 75 MG tablet Commonly known as:  PLAVIX Take 75 mg by mouth daily.   DEXILANT 60 MG capsule Generic drug:  dexlansoprazole Take 60 mg by mouth daily.   diazepam 5 MG tablet Commonly known as:  VALIUM Take 0.5 tablets (2.5 mg total) by mouth 3 (three) times daily as needed for anxiety.   dicyclomine 20 MG tablet Commonly known as:  BENTYL Take 20 mg by mouth every 6 (six) hours as needed for spasms.   ipratropium-albuterol 0.5-2.5 (3) MG/3ML Soln Commonly known as:  DUONEB Inhale 3 mLs into the lungs every 6 (six) hours as needed (for shortness of breath or wheezing).   mupirocin ointment 2 % Commonly known as:  BACTROBAN Apply 1 application topically 2 (two) times daily.   OXYCONTIN 10 mg 12 hr tablet Generic drug:  oxyCODONE Take 10 mg by mouth 3 (three) times daily.   ranitidine 300 MG tablet Commonly known as:  ZANTAC Take 300 mg by mouth daily.   SPIRIVA RESPIMAT 1.25 MCG/ACT Aers Generic drug:  Tiotropium Bromide Monohydrate Inhale 1 puff into the lungs 2 (two) times daily.   temazepam 30 MG capsule Commonly known as:  RESTORIL Take 1 capsule  (30 mg total) by mouth at bedtime.   TIGER BALM ARTHRITIS RUB EX Apply 1 application topically daily as needed (for back pain).   Vitamin D (Ergocalciferol) 50000 units Caps capsule Commonly known as:  DRISDOL Take 50,000 Units by mouth every Saturday.   Vitamin D3 5000 units Caps Take 5,000 Units by mouth daily.      Follow-up Information    [facility].   Specialty:  No Name Why:  A representative from [facility] will contact you to arrange start date and time for youer therapy. Contact information: [address] [place] [address] [phone]        Penni Bombard, MD. Schedule an appointment as soon as possible for a visit in 4 week(s).   Specialties:  Neurology, Radiology Contact information: [address] [place] [address] [phone]          Allergies  Allergen Reactions  . Tape Rash and Other (See Comments)    Patient has Fibromyalgia and cannot tolerate medical tape and it's removal  . Celexa [Citalopram Hydrobromide] Other (See Comments)    "Made me cry and made me worse"  . Statins Other (See Comments)    Muscles cramp    Consultations:  Neurology   Procedures/Studies: Ct Angio Head W Or Wo Contrast  Result Date: 08/09/2017 CLINICAL DATA:  52 year old female with right side weakness found to have subacute left basal ganglia infarct. Chronic left ACA A2 occlusion. Moderate bilateral M2 MCA stenosis suspected on MRI yesterday. EXAM: CT ANGIOGRAPHY HEAD AND NECK TECHNIQUE: Multidetector CT imaging of the head and neck was performed using the standard protocol during bolus administration of intravenous contrast. Multiplanar CT image reconstructions and MIPs were obtained to evaluate the vascular anatomy. Carotid stenosis measurements (when applicable) are obtained utilizing NASCET criteria, using the distal internal carotid diameter as the denominator. CONTRAST:  47m  ISOVUE-370 IOPAMIDOL (ISOVUE-370) INJECTION 76% COMPARISON:  Brain MRI and MRA 08/08/2017, and earlier. FINDINGS: CT HEAD Brain: Hypodensity in the left corona radiata corresponding to the MRI described subacute lesion yesterday. More subtle chronic hypodensity elsewhere related to prior ischemia in the left ACA territory, bilateral basal ganglia, lateral left occipital lobe, left cerebellar peduncle. No midline shift, ventriculomegaly, mass effect, evidence of mass lesion, intracranial hemorrhage or evidence of cortically based acute infarction. Calvarium and skull base: Negative. Paranasal sinuses: Clear. Orbits: No acute orbit or scalp soft tissue findings. CTA NECK Skeleton: Absent maxillary and posterior mandible dentition. No acute osseous  abnormality identified. Cervical spine disc and endplate degeneration. Upper chest: Negative lung apices. Superior mediastinal lipomatosis without lymphadenopathy. Other neck: Subcentimeter hypodense left thyroid nodule does not meet consensus criteria for ultrasound follow-up. Otherwise negative. No cervical lymphadenopathy. Aortic arch: 3 vessel arch configuration with no arch atherosclerosis. Right carotid system: Negative brachiocephalic artery and right CCA origin. Mild right CCA tortuosity. There is confluent calcified plaque at the posterior right ICA origin and bulge, but no stenosis results. Mild cervical right ICA tortuosity. Left carotid system: Normal left CCA origin. Mild left CCA tortuosity. Normal left carotid bifurcation. Tortuous cervical left ICA without stenosis. Vertebral arteries: Minimal proximal right subclavian artery soft plaque without stenosis. Normal right vertebral artery origin. Mild right vertebral artery tortuosity with no stenosis to the skull base. Tortuous proximal left subclavian artery without plaque or stenosis. Normal left vertebral artery origin. Patent left vertebral artery to the skull base without stenosis. CTA HEAD Posterior  circulation: Patent distal vertebral arteries without stenosis. The left is mildly dominant. There is minimal calcified plaque in the left V4 segment. Normal right PICA origin. As before, the left PICA is diminutive or absent and the left AICA is diminutive. The right AICA is normal. Patent vertebrobasilar junction and basilar artery without stenosis. Patent AICA and SCA origins. Normal left PCA origin. Fetal type right PCA origin. Diminutive or absent left posterior communicating artery. The proximal PCAs are mildly irregular. The distal PCA branches are mildly irregular. Anterior circulation: Patent ICA siphons with no atherosclerosis or stenosis. Normal ophthalmic and right posterior communicating artery origins. Patent carotid termini. Normal MCA and ACA origins. Codominant A1 segments. Normal anterior communicating artery. Chronic occlusion of the proximal left ACA A2 is unchanged since 2018 (series 16, image 25). The right ACA and its branches are within normal limits. The left MCA M1 segment and bifurcation appear within normal limits. There is moderate to severe irregularity and stenosis of a left MCA M2 middle sylvian branch demonstrated on series 16, image 32 which is new since 2018 and stable to the MRA yesterday. The other MCA branches scratched at the other left MCA branches are within normal limits. The right MCA M1 segment and bifurcation appear normal. There is a similar segment of moderate irregularity and stenosis in a posterior right MCA M2 branch seen on series 16, image 52 which is more apparent than on the MRA yesterday. This also appears new since 2018. There is otherwise mild right MCA branch irregularity. Venous sinuses: Patent. Anatomic variants: None. Delayed phase: No abnormal enhancement identified. Review of the MIP images confirms the above findings IMPRESSION: 1. Minimal atherosclerosis from the arch to the skull base - limited to the proximal right ICA with no stenosis. Otherwise  normal appearance of the great vessel origins, and the carotid and vertebral arteries. 2. However, intracranially there is chronic left ACA A2 occlusion. And bilateral MCA M2 branch segments of moderate to severe irregularity (greater on the left) which has developed since a May 2018 MRA (see series 16, images 32 and 15). 3. The main differential considerations include disproportionate intracranial atherosclerosis, sequelae of intracranial thromboemboli, and intracranial vasculitis. 4. No acute intracranial abnormality. Stable CT appearance of the brain since the MRI yesterday. Electronically Signed   By: Genevie Ann M.D.   On: 08/09/2017 10:16   Ct Angio Neck W Or Wo Contrast  Result Date: 08/09/2017 CLINICAL DATA:  52 year old female with right side weakness found to have subacute left basal ganglia infarct. Chronic left ACA A2 occlusion. Moderate  bilateral M2 MCA stenosis suspected on MRI yesterday. EXAM: CT ANGIOGRAPHY HEAD AND NECK TECHNIQUE: Multidetector CT imaging of the head and neck was performed using the standard protocol during bolus administration of intravenous contrast. Multiplanar CT image reconstructions and MIPs were obtained to evaluate the vascular anatomy. Carotid stenosis measurements (when applicable) are obtained utilizing NASCET criteria, using the distal internal carotid diameter as the denominator. CONTRAST:  4m ISOVUE-370 IOPAMIDOL (ISOVUE-370) INJECTION 76% COMPARISON:  Brain MRI and MRA 08/08/2017, and earlier. FINDINGS: CT HEAD Brain: Hypodensity in the left corona radiata corresponding to the MRI described subacute lesion yesterday. More subtle chronic hypodensity elsewhere related to prior ischemia in the left ACA territory, bilateral basal ganglia, lateral left occipital lobe, left cerebellar peduncle. No midline shift, ventriculomegaly, mass effect, evidence of mass lesion, intracranial hemorrhage or evidence of cortically based acute infarction. Calvarium and skull base:  Negative. Paranasal sinuses: Clear. Orbits: No acute orbit or scalp soft tissue findings. CTA NECK Skeleton: Absent maxillary and posterior mandible dentition. No acute osseous abnormality identified. Cervical spine disc and endplate degeneration. Upper chest: Negative lung apices. Superior mediastinal lipomatosis without lymphadenopathy. Other neck: Subcentimeter hypodense left thyroid nodule does not meet consensus criteria for ultrasound follow-up. Otherwise negative. No cervical lymphadenopathy. Aortic arch: 3 vessel arch configuration with no arch atherosclerosis. Right carotid system: Negative brachiocephalic artery and right CCA origin. Mild right CCA tortuosity. There is confluent calcified plaque at the posterior right ICA origin and bulge, but no stenosis results. Mild cervical right ICA tortuosity. Left carotid system: Normal left CCA origin. Mild left CCA tortuosity. Normal left carotid bifurcation. Tortuous cervical left ICA without stenosis. Vertebral arteries: Minimal proximal right subclavian artery soft plaque without stenosis. Normal right vertebral artery origin. Mild right vertebral artery tortuosity with no stenosis to the skull base. Tortuous proximal left subclavian artery without plaque or stenosis. Normal left vertebral artery origin. Patent left vertebral artery to the skull base without stenosis. CTA HEAD Posterior circulation: Patent distal vertebral arteries without stenosis. The left is mildly dominant. There is minimal calcified plaque in the left V4 segment. Normal right PICA origin. As before, the left PICA is diminutive or absent and the left AICA is diminutive. The right AICA is normal. Patent vertebrobasilar junction and basilar artery without stenosis. Patent AICA and SCA origins. Normal left PCA origin. Fetal type right PCA origin. Diminutive or absent left posterior communicating artery. The proximal PCAs are mildly irregular. The distal PCA branches are mildly irregular.  Anterior circulation: Patent ICA siphons with no atherosclerosis or stenosis. Normal ophthalmic and right posterior communicating artery origins. Patent carotid termini. Normal MCA and ACA origins. Codominant A1 segments. Normal anterior communicating artery. Chronic occlusion of the proximal left ACA A2 is unchanged since 2018 (series 16, image 25). The right ACA and its branches are within normal limits. The left MCA M1 segment and bifurcation appear within normal limits. There is moderate to severe irregularity and stenosis of a left MCA M2 middle sylvian branch demonstrated on series 16, image 32 which is new since 2018 and stable to the MRA yesterday. The other MCA branches scratched at the other left MCA branches are within normal limits. The right MCA M1 segment and bifurcation appear normal. There is a similar segment of moderate irregularity and stenosis in a posterior right MCA M2 branch seen on series 16, image 52 which is more apparent than on the MRA yesterday. This also appears new since 2018. There is otherwise mild right MCA branch irregularity. Venous sinuses: Patent.  Anatomic variants: None. Delayed phase: No abnormal enhancement identified. Review of the MIP images confirms the above findings IMPRESSION: 1. Minimal atherosclerosis from the arch to the skull base - limited to the proximal right ICA with no stenosis. Otherwise normal appearance of the great vessel origins, and the carotid and vertebral arteries. 2. However, intracranially there is chronic left ACA A2 occlusion. And bilateral MCA M2 branch segments of moderate to severe irregularity (greater on the left) which has developed since a May 2018 MRA (see series 16, images 32 and 15). 3. The main differential considerations include disproportionate intracranial atherosclerosis, sequelae of intracranial thromboemboli, and intracranial vasculitis. 4. No acute intracranial abnormality. Stable CT appearance of the brain since the MRI  yesterday. Electronically Signed   By: Genevie Ann M.D.   On: 08/09/2017 10:16   Mr Angiogram Head Wo Contrast  Result Date: 08/08/2017 CLINICAL DATA:  RIGHT-sided weakness beginning last night, intermittent diplopia and decreased LEFT hearing with severe headache. History of hypertension, stroke. EXAM: MRI HEAD WITHOUT CONTRAST MRA HEAD WITHOUT CONTRAST TECHNIQUE: Multiplanar, multiecho pulse sequences of the brain and surrounding structures were obtained without intravenous contrast. Angiographic images of the head were obtained using MRA technique without contrast. COMPARISON:  MRI/MRA head Sep 27, 2016 FINDINGS: MRI HEAD FINDINGS INTRACRANIAL CONTENTS: Very faint reduced diffusion associated with LEFT basal ganglia infarct, predominant T2 shine through and faint susceptibility artifact. No reduced diffusion to suggest acute ischemia. Patchy FLAIR T2 hyperintense signal LEFT posterior temporal lobe similar to prior examination. Old bilateral basal ganglia lacunar infarcts. Old LEFT brachium pontis to cerebellar hemisphere infarct. Minimal mesial LEFT frontal lobe encephalomalacia. Scattered subcentimeter supratentorial white matter hypodensities. No midline shift, mass effect or masses. No abnormal extra-axial fluid collections. VASCULAR: Normal major intracranial vascular flow voids present at skull base. SKULL AND UPPER CERVICAL SPINE: No abnormal sellar expansion. No suspicious calvarial bone marrow signal. Craniocervical junction maintained. SINUSES/ORBITS: The mastoid air-cells and included paranasal sinuses are well-aerated.The included ocular globes and orbital contents are non-suspicious. OTHER: None. MRA HEAD FINDINGS ANTERIOR CIRCULATION: Normal flow related enhancement of the included cervical, petrous, cavernous and supraclinoid internal carotid arteries. Patent fenestrated anterior communicating artery. 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 Moderate stenosis LEFT M2  segment (superior division) and RIGHT M2 segment (inferior division) worse than prior MRI. Anterior and middle cerebral arteries are otherwise patent. No emergent large vessel occlusion, aneurysm. POSTERIOR CIRCULATION: Nearly codominant vertebral arteries. Basilar artery is patent, with normal flow related enhancement of the main branch vessels. Patent posterior cerebral arteries. Fetal origin RIGHT posterior cerebral artery. No large vessel occlusion, flow limiting stenosis,  aneurysm. ANATOMIC VARIANTS: None. Source images and MIP images were reviewed. IMPRESSION: MRI HEAD: 1. New early subacute LEFT basal ganglia infarct. 2. Old bilateral basal ganglia lacunar infarcts. 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 Old LEFT frontal/ACA and old LEFT temporal/MCA territory infarcts. 3. Mild chronic small vessel ischemic disease. MRA HEAD: 1. No emergent large vessel occlusion. Chronically occluded LEFT A2 segment. 2. Moderate stenosis bilateral M2 segments. Electronically Signed   By: Elon Alas M.D.   On: 08/08/2017 19:30   Mr Brain Wo Contrast  Result Date: 08/08/2017 CLINICAL DATA:  RIGHT-sided weakness beginning last night, intermittent diplopia and decreased LEFT hearing with severe headache. History of hypertension, stroke. EXAM: MRI HEAD WITHOUT CONTRAST MRA HEAD WITHOUT CONTRAST TECHNIQUE: Multiplanar, multiecho pulse sequences of the brain and surrounding structures were obtained without intravenous contrast. Angiographic images of the head were obtained using MRA technique without contrast.  COMPARISON:  MRI/MRA head Sep 27, 2016 FINDINGS: MRI HEAD FINDINGS INTRACRANIAL CONTENTS: Very faint reduced diffusion associated with LEFT basal ganglia infarct, predominant T2 shine through and faint susceptibility artifact. No reduced diffusion to suggest acute ischemia. Patchy FLAIR T2 hyperintense signal LEFT posterior temporal lobe similar to prior examination. Old bilateral basal ganglia lacunar  infarcts. Old LEFT brachium pontis to cerebellar hemisphere infarct. Minimal mesial LEFT frontal lobe encephalomalacia. Scattered subcentimeter supratentorial white matter hypodensities. No midline shift, mass effect or masses. No abnormal extra-axial fluid collections. VASCULAR: Normal major intracranial vascular flow voids present at skull base. SKULL AND UPPER CERVICAL SPINE: No abnormal sellar expansion. No suspicious calvarial bone marrow signal. Craniocervical junction maintained. SINUSES/ORBITS: The mastoid air-cells and included paranasal sinuses are well-aerated.The included ocular globes and orbital contents are non-suspicious. OTHER: None. MRA HEAD FINDINGS ANTERIOR CIRCULATION: Normal flow related enhancement of the included cervical, petrous, cavernous and supraclinoid internal carotid arteries. Patent fenestrated anterior communicating artery. 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 Moderate stenosis LEFT M2 segment (superior division) and RIGHT M2 segment (inferior division) worse than prior MRI. Anterior and middle cerebral arteries are otherwise patent. No emergent large vessel occlusion, aneurysm. POSTERIOR CIRCULATION: Nearly codominant vertebral arteries. Basilar artery is patent, with normal flow related enhancement of the main branch vessels. Patent posterior cerebral arteries. Fetal origin RIGHT posterior cerebral artery. No large vessel occlusion, flow limiting stenosis,  aneurysm. ANATOMIC VARIANTS: None. Source images and MIP images were reviewed. IMPRESSION: MRI HEAD: 1. New early subacute LEFT basal ganglia infarct. 2. Old bilateral basal ganglia lacunar infarcts. 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 Old LEFT frontal/ACA and old LEFT temporal/MCA territory infarcts. 3. Mild chronic small vessel ischemic disease. MRA HEAD: 1. No emergent large vessel occlusion. Chronically occluded LEFT A2 segment. 2. Moderate stenosis bilateral M2 segments.  Electronically Signed   By: Elon Alas M.D.   On: 08/08/2017 19:30   Echo Study Conclusions  - Left ventricle: The cavity size was normal. Wall thickness was   normal. Systolic function was normal. The estimated ejection   fraction was in the range of 60% to 65%. Wall motion was normal;   there were no regional wall motion abnormalities.  Impressions:  - Normal LV function; no significant valvular disease.     Subjective: Feeling tired, no change from previous 2 months.  At baseline.  No headache, no hemiparesis, no speech changes, no seizures, no numbness, no fever, no cough, no sputum, no dysuria.  No dizziness with standing or confusion.        Discharge Exam: Vitals:   08/10/17 1525 08/10/17 1605  BP: (!) 86/53 (!) 110/48  Pulse: (!) 50   Resp: 17   Temp: 97.9 F (36.6 C)   SpO2: 97%    Vitals:   08/10/17 1220 08/10/17 1300 08/10/17 1525 08/10/17 1605  BP: (!) 87/49 (!) 100/48 (!) 86/53 (!) 110/48  Pulse: (!) 51  (!) 50   Resp: 16  17   Temp: 97.7 F (36.5 C)  97.9 F (36.6 C)   TempSrc: Oral  Axillary   SpO2: 95%  97%   Weight:      Height:        General: Pt is alert, awake, not in acute distress, ambulating to bathroom, able to take a shower with minimal assistance Cardiovascular: RRR, S1/S2 +, no rubs, no gallops Respiratory: CTA bilaterally, no wheezing, no rhonchi Abdominal: Soft, NT, ND, bowel sounds + Extremities: no edema, no cyanosis    The results  of significant diagnostics from this hospitalization (including imaging, microbiology, ancillary and laboratory) are listed below for reference.     Microbiology: No results found for this or any previous visit (from the past 240 hour(s)).   Labs: BNP (last 3 results) No results for input(s): BNP in the last 8760 hours. Basic Metabolic Panel: Recent Labs  Lab 08/08/17 1547 08/08/17 1600 08/09/17 0622  NA 138 140 138  K 4.3 4.2 4.1  CL 105 103 105  CO2 23  --  23  GLUCOSE  132* 132* 105*  BUN 11 12 13   CREATININE 0.70 0.60 0.74  CALCIUM 9.7  --  9.0   Liver Function Tests: Recent Labs  Lab 08/08/17 1547  AST 39  ALT 38  ALKPHOS 81  BILITOT 0.4  PROT 7.0  ALBUMIN 3.5   No results for input(s): LIPASE, AMYLASE in the last 168 hours. No results for input(s): AMMONIA in the last 168 hours. CBC: Recent Labs  Lab 08/08/17 1513 08/08/17 1547 08/08/17 1600 08/09/17 0622  WBC 8.9  --   --  9.6  NEUTROABS  --  5.6  --  4.1  HGB 13.7  --  14.3 13.2  HCT 41.5  --  42.0 40.1  MCV 89.1  --   --  89.3  PLT 306  --   --  282   Cardiac Enzymes: No results for input(s): CKTOTAL, CKMB, CKMBINDEX, TROPONINI in the last 168 hours. BNP: Invalid input(s): POCBNP CBG: No results for input(s): GLUCAP in the last 168 hours. D-Dimer No results for input(s): DDIMER in the last 72 hours. Hgb A1c Recent Labs    08/09/17 0622  HGBA1C 5.9*   Lipid Profile Recent Labs    08/09/17 0622  CHOL 124  HDL 31*  LDLCALC 52  TRIG 203*  CHOLHDL 4.0   Thyroid function studies No results for input(s): TSH, T4TOTAL, T3FREE, THYROIDAB in the last 72 hours.  Invalid input(s): FREET3 Anemia work up No results for input(s): VITAMINB12, FOLATE, FERRITIN, TIBC, IRON, RETICCTPCT in the last 72 hours. Urinalysis    Component Value Date/Time   COLORURINE YELLOW 08/08/2017 1541   APPEARANCEUR CLEAR 08/08/2017 1541   LABSPEC 1.014 08/08/2017 1541   PHURINE 5.0 08/08/2017 1541   GLUCOSEU NEGATIVE 08/08/2017 1541   HGBUR NEGATIVE 08/08/2017 1541   BILIRUBINUR NEGATIVE 08/08/2017 1541   KETONESUR NEGATIVE 08/08/2017 1541   PROTEINUR NEGATIVE 08/08/2017 1541   UROBILINOGEN 0.2 08/22/2011 1711   NITRITE NEGATIVE 08/08/2017 1541   LEUKOCYTESUR TRACE (A) 08/08/2017 1541   Sepsis Labs Invalid input(s): PROCALCITONIN,  WBC,  LACTICIDVEN Microbiology No results found for this or any previous visit (from the past 240 hour(s)).   Time coordinating discharge: 50  minutes  SIGNED:   Edwin Dada, MD  [facility] 08/10/2017, 4:23 PM

## 2017-08-10 NOTE — Progress Notes (Addendum)
STROKE TEAM PROGRESS NOTE   SUBJECTIVE (INTERVAL HISTORY) No family is at bedside, she is sitting in bed for lunch. LP not done due to plavix use. Pt has concerns to hold off plavix for LP as outpt. She again has left ear hearing loss for several seconds during shower today but no dizziness. This pattern is not consistent with TIA, will need ENT for left ear evaluation.     OBJECTIVE Vitals:   08/10/17 0445 08/10/17 0737 08/10/17 0810 08/10/17 1220  BP: (!) 90/46   (!) 87/49  Pulse: (!) 49  61 (!) 51  Resp: 18  17 16   Temp: 97.6 F (36.4 C) 97.9 F (36.6 C)  97.7 F (36.5 C)  TempSrc: Oral Oral  Oral  SpO2: 94%  97% 95%  Weight:      Height:        CBC:  Recent Labs  Lab 08/08/17 1513 08/08/17 1547 08/08/17 1600 08/09/17 0622  WBC 8.9  --   --  9.6  NEUTROABS  --  5.6  --  4.1  HGB 13.7  --  14.3 13.2  HCT 41.5  --  42.0 40.1  MCV 89.1  --   --  89.3  PLT 306  --   --  626    Basic Metabolic Panel:  Recent Labs  Lab 08/08/17 1547 08/08/17 1600 08/09/17 0622  NA 138 140 138  K 4.3 4.2 4.1  CL 105 103 105  CO2 23  --  23  GLUCOSE 132* 132* 105*  BUN 11 12 13   CREATININE 0.70 0.60 0.74  CALCIUM 9.7  --  9.0    Lipid Panel:     Component Value Date/Time   CHOL 124 08/09/2017 0622   TRIG 203 (H) 08/09/2017 0622   HDL 31 (L) 08/09/2017 0622   CHOLHDL 4.0 08/09/2017 0622   VLDL 41 (H) 08/09/2017 0622   LDLCALC 52 08/09/2017 0622   HgbA1c:  Lab Results  Component Value Date   HGBA1C 5.9 (H) 08/09/2017   Urine Drug Screen:     Component Value Date/Time   LABOPIA NONE DETECTED 08/08/2017 1541   COCAINSCRNUR NONE DETECTED 08/08/2017 1541   LABBENZ POSITIVE (A) 08/08/2017 1541   AMPHETMU NONE DETECTED 08/08/2017 1541   THCU NONE DETECTED 08/08/2017 1541   LABBARB NONE DETECTED 08/08/2017 1541    Alcohol Level     Component Value Date/Time   ETH <10 08/08/2017 1545    IMAGING I have personally reviewed the radiological images below and agree  with the radiology interpretations.  Ct Angio Head W Or Wo Contrast  Result Date: 08/09/2017 CLINICAL DATA:  52 year old female with right side weakness found to have subacute left basal ganglia infarct. Chronic left ACA A2 occlusion. Moderate bilateral M2 MCA stenosis suspected on MRI yesterday. EXAM: CT ANGIOGRAPHY HEAD AND NECK TECHNIQUE: Multidetector CT imaging of the head and neck was performed using the standard protocol during bolus administration of intravenous contrast. Multiplanar CT image reconstructions and MIPs were obtained to evaluate the vascular anatomy. Carotid stenosis measurements (when applicable) are obtained utilizing NASCET criteria, using the distal internal carotid diameter as the denominator. CONTRAST:  28mL ISOVUE-370 IOPAMIDOL (ISOVUE-370) INJECTION 76% COMPARISON:  Brain MRI and MRA 08/08/2017, and earlier. FINDINGS: CT HEAD Brain: Hypodensity in the left corona radiata corresponding to the MRI described subacute lesion yesterday. More subtle chronic hypodensity elsewhere related to prior ischemia in the left ACA territory, bilateral basal ganglia, lateral left occipital lobe, left cerebellar peduncle. No  midline shift, ventriculomegaly, mass effect, evidence of mass lesion, intracranial hemorrhage or evidence of cortically based acute infarction. Calvarium and skull base: Negative. Paranasal sinuses: Clear. Orbits: No acute orbit or scalp soft tissue findings. CTA NECK Skeleton: Absent maxillary and posterior mandible dentition. No acute osseous abnormality identified. Cervical spine disc and endplate degeneration. Upper chest: Negative lung apices. Superior mediastinal lipomatosis without lymphadenopathy. Other neck: Subcentimeter hypodense left thyroid nodule does not meet consensus criteria for ultrasound follow-up. Otherwise negative. No cervical lymphadenopathy. Aortic arch: 3 vessel arch configuration with no arch atherosclerosis. Right carotid system: Negative  brachiocephalic artery and right CCA origin. Mild right CCA tortuosity. There is confluent calcified plaque at the posterior right ICA origin and bulge, but no stenosis results. Mild cervical right ICA tortuosity. Left carotid system: Normal left CCA origin. Mild left CCA tortuosity. Normal left carotid bifurcation. Tortuous cervical left ICA without stenosis. Vertebral arteries: Minimal proximal right subclavian artery soft plaque without stenosis. Normal right vertebral artery origin. Mild right vertebral artery tortuosity with no stenosis to the skull base. Tortuous proximal left subclavian artery without plaque or stenosis. Normal left vertebral artery origin. Patent left vertebral artery to the skull base without stenosis. CTA HEAD Posterior circulation: Patent distal vertebral arteries without stenosis. The left is mildly dominant. There is minimal calcified plaque in the left V4 segment. Normal right PICA origin. As before, the left PICA is diminutive or absent and the left AICA is diminutive. The right AICA is normal. Patent vertebrobasilar junction and basilar artery without stenosis. Patent AICA and SCA origins. Normal left PCA origin. Fetal type right PCA origin. Diminutive or absent left posterior communicating artery. The proximal PCAs are mildly irregular. The distal PCA branches are mildly irregular. Anterior circulation: Patent ICA siphons with no atherosclerosis or stenosis. Normal ophthalmic and right posterior communicating artery origins. Patent carotid termini. Normal MCA and ACA origins. Codominant A1 segments. Normal anterior communicating artery. Chronic occlusion of the proximal left ACA A2 is unchanged since 2018 (series 16, image 25). The right ACA and its branches are within normal limits. The left MCA M1 segment and bifurcation appear within normal limits. There is moderate to severe irregularity and stenosis of a left MCA M2 middle sylvian branch demonstrated on series 16, image 32  which is new since 2018 and stable to the MRA yesterday. The other MCA branches scratched at the other left MCA branches are within normal limits. The right MCA M1 segment and bifurcation appear normal. There is a similar segment of moderate irregularity and stenosis in a posterior right MCA M2 branch seen on series 16, image 52 which is more apparent than on the MRA yesterday. This also appears new since 2018. There is otherwise mild right MCA branch irregularity. Venous sinuses: Patent. Anatomic variants: None. Delayed phase: No abnormal enhancement identified. Review of the MIP images confirms the above findings IMPRESSION: 1. Minimal atherosclerosis from the arch to the skull base - limited to the proximal right ICA with no stenosis. Otherwise normal appearance of the great vessel origins, and the carotid and vertebral arteries. 2. However, intracranially there is chronic left ACA A2 occlusion. And bilateral MCA M2 branch segments of moderate to severe irregularity (greater on the left) which has developed since a May 2018 MRA (see series 16, images 32 and 15). 3. The main differential considerations include disproportionate intracranial atherosclerosis, sequelae of intracranial thromboemboli, and intracranial vasculitis. 4. No acute intracranial abnormality. Stable CT appearance of the brain since the MRI yesterday. Electronically Signed  By: Genevie Ann M.D.   On: 08/09/2017 10:16   Ct Angio Neck W Or Wo Contrast  Result Date: 08/09/2017 CLINICAL DATA:  52 year old female with right side weakness found to have subacute left basal ganglia infarct. Chronic left ACA A2 occlusion. Moderate bilateral M2 MCA stenosis suspected on MRI yesterday. EXAM: CT ANGIOGRAPHY HEAD AND NECK TECHNIQUE: Multidetector CT imaging of the head and neck was performed using the standard protocol during bolus administration of intravenous contrast. Multiplanar CT image reconstructions and MIPs were obtained to evaluate the vascular  anatomy. Carotid stenosis measurements (when applicable) are obtained utilizing NASCET criteria, using the distal internal carotid diameter as the denominator. CONTRAST:  35mL ISOVUE-370 IOPAMIDOL (ISOVUE-370) INJECTION 76% COMPARISON:  Brain MRI and MRA 08/08/2017, and earlier. FINDINGS: CT HEAD Brain: Hypodensity in the left corona radiata corresponding to the MRI described subacute lesion yesterday. More subtle chronic hypodensity elsewhere related to prior ischemia in the left ACA territory, bilateral basal ganglia, lateral left occipital lobe, left cerebellar peduncle. No midline shift, ventriculomegaly, mass effect, evidence of mass lesion, intracranial hemorrhage or evidence of cortically based acute infarction. Calvarium and skull base: Negative. Paranasal sinuses: Clear. Orbits: No acute orbit or scalp soft tissue findings. CTA NECK Skeleton: Absent maxillary and posterior mandible dentition. No acute osseous abnormality identified. Cervical spine disc and endplate degeneration. Upper chest: Negative lung apices. Superior mediastinal lipomatosis without lymphadenopathy. Other neck: Subcentimeter hypodense left thyroid nodule does not meet consensus criteria for ultrasound follow-up. Otherwise negative. No cervical lymphadenopathy. Aortic arch: 3 vessel arch configuration with no arch atherosclerosis. Right carotid system: Negative brachiocephalic artery and right CCA origin. Mild right CCA tortuosity. There is confluent calcified plaque at the posterior right ICA origin and bulge, but no stenosis results. Mild cervical right ICA tortuosity. Left carotid system: Normal left CCA origin. Mild left CCA tortuosity. Normal left carotid bifurcation. Tortuous cervical left ICA without stenosis. Vertebral arteries: Minimal proximal right subclavian artery soft plaque without stenosis. Normal right vertebral artery origin. Mild right vertebral artery tortuosity with no stenosis to the skull base. Tortuous proximal  left subclavian artery without plaque or stenosis. Normal left vertebral artery origin. Patent left vertebral artery to the skull base without stenosis. CTA HEAD Posterior circulation: Patent distal vertebral arteries without stenosis. The left is mildly dominant. There is minimal calcified plaque in the left V4 segment. Normal right PICA origin. As before, the left PICA is diminutive or absent and the left AICA is diminutive. The right AICA is normal. Patent vertebrobasilar junction and basilar artery without stenosis. Patent AICA and SCA origins. Normal left PCA origin. Fetal type right PCA origin. Diminutive or absent left posterior communicating artery. The proximal PCAs are mildly irregular. The distal PCA branches are mildly irregular. Anterior circulation: Patent ICA siphons with no atherosclerosis or stenosis. Normal ophthalmic and right posterior communicating artery origins. Patent carotid termini. Normal MCA and ACA origins. Codominant A1 segments. Normal anterior communicating artery. Chronic occlusion of the proximal left ACA A2 is unchanged since 2018 (series 16, image 25). The right ACA and its branches are within normal limits. The left MCA M1 segment and bifurcation appear within normal limits. There is moderate to severe irregularity and stenosis of a left MCA M2 middle sylvian branch demonstrated on series 16, image 32 which is new since 2018 and stable to the MRA yesterday. The other MCA branches scratched at the other left MCA branches are within normal limits. The right MCA M1 segment and bifurcation appear normal. There is a  similar segment of moderate irregularity and stenosis in a posterior right MCA M2 branch seen on series 16, image 52 which is more apparent than on the MRA yesterday. This also appears new since 2018. There is otherwise mild right MCA branch irregularity. Venous sinuses: Patent. Anatomic variants: None. Delayed phase: No abnormal enhancement identified. Review of the MIP  images confirms the above findings IMPRESSION: 1. Minimal atherosclerosis from the arch to the skull base - limited to the proximal right ICA with no stenosis. Otherwise normal appearance of the great vessel origins, and the carotid and vertebral arteries. 2. However, intracranially there is chronic left ACA A2 occlusion. And bilateral MCA M2 branch segments of moderate to severe irregularity (greater on the left) which has developed since a May 2018 MRA (see series 16, images 32 and 15). 3. The main differential considerations include disproportionate intracranial atherosclerosis, sequelae of intracranial thromboemboli, and intracranial vasculitis. 4. No acute intracranial abnormality. Stable CT appearance of the brain since the MRI yesterday. Electronically Signed   By: Genevie Ann M.D.   On: 08/09/2017 10:16   Mr Angiogram Head Wo Contrast  Result Date: 08/08/2017 CLINICAL DATA:  RIGHT-sided weakness beginning last night, intermittent diplopia and decreased LEFT hearing with severe headache. History of hypertension, stroke. EXAM: MRI HEAD WITHOUT CONTRAST MRA HEAD WITHOUT CONTRAST TECHNIQUE: Multiplanar, multiecho pulse sequences of the brain and surrounding structures were obtained without intravenous contrast. Angiographic images of the head were obtained using MRA technique without contrast. COMPARISON:  MRI/MRA head Sep 27, 2016 FINDINGS: MRI HEAD FINDINGS INTRACRANIAL CONTENTS: Very faint reduced diffusion associated with LEFT basal ganglia infarct, predominant T2 shine through and faint susceptibility artifact. No reduced diffusion to suggest acute ischemia. Patchy FLAIR T2 hyperintense signal LEFT posterior temporal lobe similar to prior examination. Old bilateral basal ganglia lacunar infarcts. Old LEFT brachium pontis to cerebellar hemisphere infarct. Minimal mesial LEFT frontal lobe encephalomalacia. Scattered subcentimeter supratentorial white matter hypodensities. No midline shift, mass effect or  masses. No abnormal extra-axial fluid collections. VASCULAR: Normal major intracranial vascular flow voids present at skull base. SKULL AND UPPER CERVICAL SPINE: No abnormal sellar expansion. No suspicious calvarial bone marrow signal. Craniocervical junction maintained. SINUSES/ORBITS: The mastoid air-cells and included paranasal sinuses are well-aerated.The included ocular globes and orbital contents are non-suspicious. OTHER: None. MRA HEAD FINDINGS ANTERIOR CIRCULATION: Normal flow related enhancement of the included cervical, petrous, cavernous and supraclinoid internal carotid arteries. Patent fenestrated anterior communicating artery. 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 Moderate stenosis LEFT M2 segment (superior division) and RIGHT M2 segment (inferior division) worse than prior MRI. Anterior and middle cerebral arteries are otherwise patent. No emergent large vessel occlusion, aneurysm. POSTERIOR CIRCULATION: Nearly codominant vertebral arteries. Basilar artery is patent, with normal flow related enhancement of the main branch vessels. Patent posterior cerebral arteries. Fetal origin RIGHT posterior cerebral artery. No large vessel occlusion, flow limiting stenosis,  aneurysm. ANATOMIC VARIANTS: None. Source images and MIP images were reviewed. IMPRESSION: MRI HEAD: 1. New early subacute LEFT basal ganglia infarct. 2. Old bilateral basal ganglia lacunar infarcts. 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 Old LEFT frontal/ACA and old LEFT temporal/MCA territory infarcts. 3. Mild chronic small vessel ischemic disease. MRA HEAD: 1. No emergent large vessel occlusion. Chronically occluded LEFT A2 segment. 2. Moderate stenosis bilateral M2 segments. Electronically Signed   By: Elon Alas M.D.   On: 08/08/2017 19:30   Mr Brain Wo Contrast  Result Date: 08/08/2017 CLINICAL DATA:  RIGHT-sided weakness beginning last night, intermittent diplopia and decreased  LEFT hearing  with severe headache. History of hypertension, stroke. EXAM: MRI HEAD WITHOUT CONTRAST MRA HEAD WITHOUT CONTRAST TECHNIQUE: Multiplanar, multiecho pulse sequences of the brain and surrounding structures were obtained without intravenous contrast. Angiographic images of the head were obtained using MRA technique without contrast. COMPARISON:  MRI/MRA head Sep 27, 2016 FINDINGS: MRI HEAD FINDINGS INTRACRANIAL CONTENTS: Very faint reduced diffusion associated with LEFT basal ganglia infarct, predominant T2 shine through and faint susceptibility artifact. No reduced diffusion to suggest acute ischemia. Patchy FLAIR T2 hyperintense signal LEFT posterior temporal lobe similar to prior examination. Old bilateral basal ganglia lacunar infarcts. Old LEFT brachium pontis to cerebellar hemisphere infarct. Minimal mesial LEFT frontal lobe encephalomalacia. Scattered subcentimeter supratentorial white matter hypodensities. No midline shift, mass effect or masses. No abnormal extra-axial fluid collections. VASCULAR: Normal major intracranial vascular flow voids present at skull base. SKULL AND UPPER CERVICAL SPINE: No abnormal sellar expansion. No suspicious calvarial bone marrow signal. Craniocervical junction maintained. SINUSES/ORBITS: The mastoid air-cells and included paranasal sinuses are well-aerated.The included ocular globes and orbital contents are non-suspicious. OTHER: None. MRA HEAD FINDINGS ANTERIOR CIRCULATION: Normal flow related enhancement of the included cervical, petrous, cavernous and supraclinoid internal carotid arteries. Patent fenestrated anterior communicating artery. 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 Moderate stenosis LEFT M2 segment (superior division) and RIGHT M2 segment (inferior division) worse than prior MRI. Anterior and middle cerebral arteries are otherwise patent. No emergent large vessel occlusion, aneurysm. POSTERIOR CIRCULATION: Nearly codominant vertebral  arteries. Basilar artery is patent, with normal flow related enhancement of the main branch vessels. Patent posterior cerebral arteries. Fetal origin RIGHT posterior cerebral artery. No large vessel occlusion, flow limiting stenosis,  aneurysm. ANATOMIC VARIANTS: None. Source images and MIP images were reviewed. IMPRESSION: MRI HEAD: 1. New early subacute LEFT basal ganglia infarct. 2. Old bilateral basal ganglia lacunar infarcts. 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 Old LEFT frontal/ACA and old LEFT temporal/MCA territory infarcts. 3. Mild chronic small vessel ischemic disease. MRA HEAD: 1. No emergent large vessel occlusion. Chronically occluded LEFT A2 segment. 2. Moderate stenosis bilateral M2 segments. Electronically Signed   By: Elon Alas M.D.   On: 08/08/2017 19:30   2D echocardiogram - Left ventricle: The cavity size was normal. Wall thickness was   normal. Systolic function was normal. The estimated ejection   fraction was in the range of 60% to 65%. Wall motion was normal;   there were no regional wall motion abnormalities. Impressions: - Normal LV function; no significant valvular disease.   PHYSICAL EXAM  Temp:  [97.6 F (36.4 C)-98.7 F (37.1 C)] 97.7 F (36.5 C) (04/13 1220) Pulse Rate:  [45-61] 51 (04/13 1220) Resp:  [16-18] 16 (04/13 1220) BP: (86-124)/(41-67) 87/49 (04/13 1220) SpO2:  [89 %-99 %] 95 % (04/13 1220)  General - Well nourished, well developed, in no apparent distress.  Ophthalmologic - Fundi not visualized due to noncooperation.  Cardiovascular - Regular rate and rhythm.  Mental Status -  Level of arousal and orientation to time, place, and person were intact. Language including expression, naming, repetition, comprehension was assessed and found intact. Fund of Knowledge was assessed and was intact.  Cranial Nerves II - XII - II - Visual field intact OU. III, IV, VI - Extraocular movements intact. V - Facial sensation intact  bilaterally. VII - Facial movement intact bilaterally. VIII - Hearing & vestibular intact bilaterally. X - Palate elevates symmetrically. XI - Chin turning & shoulder shrug intact bilaterally. XII - Tongue protrusion intact.  Motor  Strength - The patient's strength was normal in all extremities except mild giveaway weakness on the right UE and LE.  Bulk was normal and fasciculations were absent.   Motor Tone - Muscle tone was assessed at the neck and appendages and was normal.  Reflexes - The patient's reflexes were 1+ in all extremities and she had no pathological reflexes.  Sensory - Light touch, temperature/pinprick were assessed and were symmetrical.    Coordination - The patient had normal movements in the hands with no ataxia or dysmetria.  Tremor was absent.  Gait and Station - deferred  ASSESSMENT/PLAN [name] is a 52 y.o. female with history of multiple strokes, hypertension, hyperlipidemia and fibromyalgia presenting with sudden onset of hearing loss in her right ear, dizziness and double vision.   Stroke-like symptoms, transient - dizziness but not vertigo, left ear hearing loss  MRI head no acute infarct by my review of images but reported by radiology new early subacute left basal ganglia infarct   MRA head no ELVO. Chronically occluded L A2. Moderate stenosis B M2.  CT head No acute abnormality  CTA head L A2 occlusion, B M2 severe irregularity new since May 2018.  CTA neck essentially unremarkable  2D Echo  EF 60-65%. No source of embolus   Loop recorder negative for atrial fibrillation or other finding since last interrogation  LDL 52  HgbA1c 5.9  Repeat hypercoagulable panel pending  LP under fluoro cancelled this time due to plavix use  Lovenox 40 mg daily for VTE prophylaxis Fall precautions Diet heart healthy/carb modified Room service appropriate? Yes; Fluid consistency: Thin  clopidogrel 75 mg daily prior to admission, now on  clopidogrel 75 mg daily. Continue Plavix at discharge  Patient counseled to be compliant with her antithrombotic medications  Ongoing aggressive stroke risk factor management  Therapy recommendations:  no therapy needs  Disposition:  return home with husband  Hx of stroke  Stroke in 2011 - details not clear  09/2016 - right-sided weakness admitted for left ACA infarct.  MRI also showed old bilateral BG infarct, old left temporal and left cerebellum infarct.  MRA showed left atrial occlusion.  No DVT.  EF 60-65%.  Carotid Doppler negative.  Hypercoagulable workup negative.  TEE negative and loop recorder placed.  LDL 48 and A1c 5.7.  Discharged on dual antiplatelet.  Intermittent left ear hearing loss  First episode lasted 5 min with dizziness but not vertigo  Second episode today in shower without dizziness  Recommend outpt ENT referral   ?? Vasculitis - Dr. Lorraine Lax concerns for Susac disease  I have low suspicious now  Would like LP to rule out but currently not able to do LP due to plavix use  Consider outpt LP scheduling and hold plavix 5 days before procedure  Pt somehow reluctant for the procedure  May also consider ENT and ophthalmology referral  She follows with Dr. Tish Frederickson as outpt. Will let pt discuss with Dr. Tish Frederickson  Hypertension  Stable . Permissive hypertension (OK if < 220/120) but gradually normalize in 5-7 days . Long-term BP goal normotensive  Other Stroke Risk Factors  Obesity, Body mass index is 39.72 kg/m., recommend weight loss, diet and exercise as appropriate   Family hx stroke (father and mother)  Hx headaches  OSA on CPAP at home  Other Active Problems  Fibromyalgia  COPD / asthma  Depression and anxiety  Neurology will sign off. Please call with questions. Pt will follow up with stroke clinic  Dr. Tish Frederickson at [facility] in about 4 weeks. Thanks for the consult.   Hospital day # 2 Rosalin Hawking, MD PhD Stroke Neurology 08/10/2017 12:58  PM   To contact Stroke Continuity provider, please refer to [email or web address] After hours, contact General Neurology

## 2017-08-10 NOTE — Evaluation (Signed)
Occupational Therapy Evaluation Patient Details Name: Lynn Rollins MRN: [id number] DOB: [birth year] Today's Date: 08/10/2017    History of Present Illness 52 y.o. female with medical history significant of HTN, CVA, asthma/COPD, s/p loop recorder, fibromyalgia, and depression; who presents with complaints of dizziness beginning 08/08/17.  She reports having associated symptoms of double vision, deafness in her right ear, headache involving the crown of her head, and worsening right-sided weakness.  Patient previously had a stroke in 08/2016 of the left ACA that left her with residual right-sided weakness for which she ambulates with a cane.  Patient had been improved, but reports last night needing assistance to get into her house.  She laid down some improvement of symptoms.  She has a loop recorder in place and sent a transmission, but was told no abnormalities were noted 08/08/17.  When she followed up with her neurologist at [facility] neurology, and told them of her symptoms she was advised to come in for further evaluation.   Clinical Impression   Pt reports she was independent with ADL PTA. Currently pt overall min guard for ADL and functional mobility. Pt lethargic throughout session; she appropriately answered questions and followed commands but with eyes closed throughout majority of tasks. Pt with impaired balance during functional activities and decreased activity tolerance. Pt planning to d/c home with intermittent supervision from family. Pt would benefit from continued skilled OT to address established goals.    Follow Up Recommendations  No OT follow up;Supervision/Assistance - 24 hour    Equipment Recommendations  3 in 1 bedside commode(for use as shower seat)    Recommendations for Other Services       Precautions / Restrictions Precautions Precautions: Fall Restrictions Weight Bearing Restrictions: No      Mobility Bed Mobility Overal bed mobility: Needs Assistance Bed  Mobility: Supine to Sit;Sit to Supine     Supine to sit: Supervision Sit to supine: Supervision   General bed mobility comments: for safety  Transfers Overall transfer level: Needs assistance Equipment used: Rolling walker (2 wheeled) Transfers: Sit to/from Stand Sit to Stand: Min guard         General transfer comment: Min guard for safety due to balance deficits    Balance Overall balance assessment: Needs assistance Sitting-balance support: Feet supported Sitting balance-Leahy Scale: Good     Standing balance support: No upper extremity supported;During functional activity Standing balance-Leahy Scale: Fair Standing balance comment: min guard for static standing balance                           ADL either performed or assessed with clinical judgement   ADL Overall ADL's : Needs assistance/impaired Eating/Feeding: Set up;Sitting   Grooming: Min guard;Standing;Oral care;Brushing hair   Upper Body Bathing: Set up;Sitting   Lower Body Bathing: Min guard;Sit to/from stand   Upper Body Dressing : Set up;Sitting   Lower Body Dressing: Min guard;Sit to/from stand   Toilet Transfer: Min guard;Ambulation;RW           Functional mobility during ADLs: Min guard;Rolling walker General ADL Comments: Pt reports she feels tired and like she cannot keep her eyes open     Vision Baseline Vision/History: (Wears contacts) Additional Comments: Difficult to assess as pt does not have contacts at hospital. Able to perform grooming tasks without cues or assist     Perception     Praxis      Pertinent Vitals/Pain Pain Assessment: No/denies pain  Hand Dominance Right   Extremity/Trunk Assessment Upper Extremity Assessment Upper Extremity Assessment: Generalized weakness   Lower Extremity Assessment Lower Extremity Assessment: Defer to PT evaluation   Cervical / Trunk Assessment Cervical / Trunk Assessment: Kyphotic   Communication  Communication Communication: No difficulties   Cognition Arousal/Alertness: Lethargic Behavior During Therapy: WFL for tasks assessed/performed Overall Cognitive Status: No family/caregiver present to determine baseline cognitive functioning                                 General Comments: Asleep at start of session; once awake pt conversive throughout but with eyes closed like she was falling asleep at times   General Comments       Exercises     Shoulder Instructions      Home Living Family/patient expects to be discharged to:: Private residence Living Arrangements: Children;Spouse/significant other Available Help at Discharge: Family;Available PRN/intermittently Type of Home: House Home Access: Stairs to enter [employer] of Steps: 4 Entrance Stairs-Rails: None Home Layout: One level     Bathroom Shower/Tub: Teacher, early years/pre: Standard     Home Equipment: Environmental consultant - 4 wheels;Cane - single point;Grab bars - tub/shower          Prior Functioning/Environment Level of Independence: Independent with assistive device(s)        Comments: used SPC        OT Problem List: Decreased strength;Decreased activity tolerance;Impaired balance (sitting and/or standing);Decreased safety awareness;Decreased knowledge of use of DME or AE      OT Treatment/Interventions: Self-care/ADL training;DME and/or AE instruction;Energy conservation;Therapeutic activities;Patient/family education;Balance training    OT Goals(Current goals can be found in the care plan section) Acute Rehab OT Goals Patient Stated Goal: have more energy OT Goal Formulation: With patient Time For Goal Achievement: 08/24/17 Potential to Achieve Goals: Good ADL Goals Pt Will Perform Tub/Shower Transfer: Tub transfer;with supervision;ambulating;3 in 1;rolling walker Additional ADL Goal #1: Pt will gather ADL items and perform ADL with mod I.  OT Frequency: Min  2X/week   Barriers to D/C:            Co-evaluation              AM-PAC PT "6 Clicks" Daily Activity     Outcome Measure Help from another person eating meals?: None Help from another person taking care of personal grooming?: A Little Help from another person toileting, which includes using toliet, bedpan, or urinal?: A Little Help from another person bathing (including washing, rinsing, drying)?: A Little Help from another person to put on and taking off regular upper body clothing?: A Little Help from another person to put on and taking off regular lower body clothing?: A Little 6 Click Score: 19   End of Session Equipment Utilized During Treatment: Rolling walker Nurse Communication: Other (comment)(RN tech: pt would like bath)  Activity Tolerance: Patient tolerated treatment well;Patient limited by lethargy Patient left: in bed;with call bell/phone within reach;with bed alarm set  OT Visit Diagnosis: Unsteadiness on feet (R26.81);Muscle weakness (generalized) (M62.81);Dizziness and giddiness (R42)                Time: 6546-5035 OT Time Calculation (min): 16 min Charges:  OT General Charges $OT Visit: 1 Visit OT Evaluation $OT Eval Moderate Complexity: 1 Mod G-Codes:     Catheryn Slifer A. Ulice Brilliant, M.S., OTR/L Pager: [place] 08/10/2017, 11:19 AM

## 2017-08-11 LAB — MPO/PR-3 (ANCA) ANTIBODIES

## 2017-08-11 LAB — CYCLIC CITRUL PEPTIDE ANTIBODY, IGG/IGA: CCP Antibodies IgG/IgA: 4 units (ref 0–19)

## 2017-08-11 LAB — RHEUMATOID FACTOR

## 2017-08-12 LAB — ANCA TITERS
Atypical P-ANCA titer: <1:20 {titer}
C-ANCA: <1:20 {titer}
P-ANCA: <1:20 {titer}

## 2017-08-13 ENCOUNTER — Emergency Department (HOSPITAL_COMMUNITY): Payer: Medicare HMO

## 2017-08-13 ENCOUNTER — Emergency Department (HOSPITAL_COMMUNITY)
Admission: EM | Admit: 2017-08-13 | Discharge: 2017-08-13 | Disposition: A | Discharge status: Home / Self Care | Payer: Medicare HMO | Attending: Emergency Medicine | Admitting: Emergency Medicine

## 2017-08-13 ENCOUNTER — Other Ambulatory Visit: Payer: Self-pay

## 2017-08-13 DIAGNOSIS — Z79899 Other long term (current) drug therapy: Secondary | ICD-10-CM | POA: Insufficient documentation

## 2017-08-13 DIAGNOSIS — J45909 Unspecified asthma, uncomplicated: Secondary | ICD-10-CM | POA: Diagnosis not present

## 2017-08-13 DIAGNOSIS — H9192 Unspecified hearing loss, left ear: Secondary | ICD-10-CM | POA: Insufficient documentation

## 2017-08-13 DIAGNOSIS — I10 Essential (primary) hypertension: Secondary | ICD-10-CM | POA: Insufficient documentation

## 2017-08-13 DIAGNOSIS — D72829 Elevated white blood cell count, unspecified: Secondary | ICD-10-CM | POA: Diagnosis not present

## 2017-08-13 DIAGNOSIS — Z7902 Long term (current) use of antithrombotics/antiplatelets: Secondary | ICD-10-CM | POA: Diagnosis not present

## 2017-08-13 DIAGNOSIS — R112 Nausea with vomiting, unspecified: Secondary | ICD-10-CM | POA: Diagnosis not present

## 2017-08-13 DIAGNOSIS — Z8673 Personal history of transient ischemic attack (TIA), and cerebral infarction without residual deficits: Secondary | ICD-10-CM | POA: Diagnosis not present

## 2017-08-13 DIAGNOSIS — M797 Fibromyalgia: Secondary | ICD-10-CM | POA: Insufficient documentation

## 2017-08-13 DIAGNOSIS — R42 Dizziness and giddiness: Secondary | ICD-10-CM

## 2017-08-13 DIAGNOSIS — R404 Transient alteration of awareness: Secondary | ICD-10-CM | POA: Diagnosis not present

## 2017-08-13 LAB — CBC
HEMATOCRIT: 43 % (ref 36.0–46.0)
HEMOGLOBIN: 14.7 g/dL (ref 12.0–15.0)
MCH: 29.8 pg (ref 26.0–34.0)
MCHC: 34.2 g/dL (ref 30.0–36.0)
MCV: 87.2 fL (ref 78.0–100.0)
Platelets: 297 10*3/uL (ref 150–400)
RBC: 4.93 MIL/uL (ref 3.87–5.11)
RDW: 13 % (ref 11.5–15.5)
WBC: 13.9 10*3/uL — ABNORMAL HIGH (ref 4.0–10.5)

## 2017-08-13 LAB — DIFFERENTIAL
Basophils Absolute: 0 10*3/uL (ref 0.0–0.1)
Basophils Relative: 0 %
EOS ABS: 0.1 10*3/uL (ref 0.0–0.7)
EOS PCT: 0 %
LYMPHS ABS: 2.2 10*3/uL (ref 0.7–4.0)
Lymphocytes Relative: 16 %
MONOS PCT: 3 %
Monocytes Absolute: 0.4 10*3/uL (ref 0.1–1.0)
Neutro Abs: 11.3 10*3/uL — ABNORMAL HIGH (ref 1.7–7.7)
Neutrophils Relative %: 81 %

## 2017-08-13 LAB — COMPREHENSIVE METABOLIC PANEL
ALK PHOS: 76 U/L (ref 38–126)
ALT: 49 U/L (ref 14–54)
ANION GAP: 10 (ref 5–15)
AST: 48 U/L — AB (ref 15–41)
Albumin: 4 g/dL (ref 3.5–5.0)
BILIRUBIN TOTAL: 0.5 mg/dL (ref 0.3–1.2)
BUN: 11 mg/dL (ref 6–20)
CALCIUM: 9.7 mg/dL (ref 8.9–10.3)
CO2: 22 mmol/L (ref 22–32)
Chloride: 106 mmol/L (ref 101–111)
Creatinine, Ser: 0.79 mg/dL (ref 0.44–1.00)
GFR calc Af Amer: >60 mL/min (ref >60)
Glucose, Bld: 154 mg/dL — ABNORMAL HIGH (ref 65–99)
POTASSIUM: 4.4 mmol/L (ref 3.5–5.1)
Sodium: 138 mmol/L (ref 135–145)
TOTAL PROTEIN: 7.6 g/dL (ref 6.5–8.1)

## 2017-08-13 LAB — CUP PACEART REMOTE DEVICE CHECK
Implantable Pulse Generator Implant Date: 20180604
MDC IDC SESS DTM: 20190305024023

## 2017-08-13 LAB — APTT: aPTT: 25 seconds (ref 24–36)

## 2017-08-13 LAB — PROTIME-INR
INR: 1.03
Prothrombin Time: 13.4 seconds (ref 11.4–15.2)

## 2017-08-13 MED ORDER — SODIUM CHLORIDE 0.9 % IV BOLUS
500.0000 mL | Freq: Once | INTRAVENOUS | Status: AC
  Administered 2017-08-13: 500 mL via INTRAVENOUS

## 2017-08-13 MED ORDER — ONDANSETRON 4 MG PO TBDP: 4.0000 mg | ORAL_TABLET | Freq: Three times a day (TID) | ORAL | 0 refills | Status: DC | PRN

## 2017-08-13 MED ORDER — ONDANSETRON HCL 4 MG/2ML IJ SOLN
4.0000 mg | Freq: Once | INTRAMUSCULAR | Status: AC
  Administered 2017-08-13: 4 mg via INTRAVENOUS
  Filled 2017-08-13: qty 2

## 2017-08-13 MED ORDER — MECLIZINE HCL 25 MG PO TABS
25.0000 mg | ORAL_TABLET | Freq: Once | ORAL | Status: AC
  Administered 2017-08-13: 25 mg via ORAL
  Filled 2017-08-13: qty 1

## 2017-08-13 MED ORDER — MECLIZINE HCL 25 MG PO TABS: 25.0000 mg | ORAL_TABLET | Freq: Three times a day (TID) | ORAL | 0 refills | Status: DC | PRN

## 2017-08-13 MED ORDER — LORAZEPAM 2 MG/ML IJ SOLN
1.0000 mg | Freq: Once | INTRAMUSCULAR | Status: AC
  Administered 2017-08-13: 1 mg via INTRAVENOUS
  Filled 2017-08-13: qty 1

## 2017-08-13 MED ORDER — OXYCODONE HCL ER 10 MG PO T12A
10.0000 mg | EXTENDED_RELEASE_TABLET | Freq: Two times a day (BID) | ORAL | Status: DC
  Administered 2017-08-13: 10 mg via ORAL
  Filled 2017-08-13: qty 1

## 2017-08-13 MED ORDER — MECLIZINE HCL 25 MG PO TABS
25.0000 mg | ORAL_TABLET | Freq: Once | ORAL | Status: AC
Start: 2017-08-13 — End: 2017-08-13
  Administered 2017-08-13: 25 mg via ORAL
  Filled 2017-08-13: qty 1

## 2017-08-13 NOTE — ED Provider Notes (Signed)
[facility] EMERGENCY DEPARTMENT Provider Note   CSN: [id number] Arrival date & time:        History   Chief Complaint Chief Complaint  Patient presents with  . Nausea    HPI Lynn Rollins is a 52 y.o. female.  The history is provided by the patient and medical records. No language interpreter was used.   Lynn Rollins is a 52 y.o. female  with a PMH of prior CVA's, HTN, HLD, chronic pain, fibromyalgia who presents to the Emergency Department complaining of acute onset of dizziness described as "swinging around the room" which started at approximately 1230 this afternoon.  Symptoms improve when her eyes are closed, but do not resolve.  Dizziness has been constant.  Associated with nausea and multiple episodes of emesis.  She was recently admitted to the hospital on 4/11 for dizziness with MRI showing new early subacute left basal ganglia infarct.  Per chart review, neurology saw patient and stated this was an equivocal finding.  Over the last 2 days, she reports being asymptomatic, until symptoms restarted today.  She endorses deafness initially in the right ear -this resolved, but then had deafness in the left ear.  She denies any difficulty with her speech or changes with ambulation.  No numbness, tingling or weakness.  Denies any changes in her vision. Zofran given prior to arrival with minimal improvement. While she does report that she had dizziness with last episode when admitted on 4/11, she notes that symptoms today are different. She is unable to characterize what is different between the two encounters.   Past Medical History:  Diagnosis Date  . Anxiety   . Asthma   . Brain lesion 2011-2012   MRI  . Chronic fatigue   . COPD (chronic obstructive pulmonary disease) ([place])   . Depression   . Fibromyalgia   . Hypertension   . Stroke [facility])     Patient Active Problem List   Diagnosis Date Noted  . COPD with asthma ([place]) 08/09/2017  . Morbid obesity  ([place])   . CVA (cerebral vascular accident) ([place]) 08/08/2017  . Chronic pain 05/29/2017  . History of stroke   . Cerebral embolism with cerebral infarction 09/27/2016  . Acute ischemic left ACA stroke ([place]) 09/27/2016  . Acute bronchitis 09/27/2016  . HTN (hypertension) 09/27/2016    Past Surgical History:  Procedure Laterality Date  . ABDOMINAL HYSTERECTOMY    . CHOLECYSTECTOMY    . LOOP RECORDER INSERTION N/A 10/01/2016   Procedure: Loop Recorder Insertion;  Surgeon: Deboraha Sprang, MD;  Location: [facility] CV LAB;  Service: Cardiovascular;  Laterality: N/A;  . TEE WITHOUT CARDIOVERSION N/A 10/01/2016   Procedure: TRANSESOPHAGEAL ECHOCARDIOGRAM (TEE);  Surgeon: Acie Fredrickson Wonda Cheng, MD;  Location: [facility] ENDOSCOPY;  Service: Cardiovascular;  Laterality: N/A;  . TUBAL LIGATION       OB History   None      Home Medications    Prior to Admission medications   Medication Sig Start Date End Date Taking? Authorizing Provider  albuterol (PROVENTIL HFA;VENTOLIN HFA) 108 (90 BASE) MCG/ACT inhaler Inhale 2 puffs into the lungs every 4 (four) hours as needed for wheezing.    Yes [provider]  BREO ELLIPTA 200-25 MCG/INH AEPB Inhale 1 puff into the lungs daily. 08/24/16  Yes [provider]  clopidogrel (PLAVIX) 75 MG tablet Take 75 mg by mouth daily.   Yes [provider]  DEXILANT 60 MG capsule Take 60 mg by mouth  daily. 08/30/16  Yes [provider]  diazepam (VALIUM) 5 MG tablet Take 0.5 tablets (2.5 mg total) by mouth 3 (three) times daily as needed for anxiety. 10/02/16  Yes Reyne Dumas, MD  dicyclomine (BENTYL) 20 MG tablet Take 20 mg by mouth every 6 (six) hours as needed for spasms.    Yes [provider]  ipratropium-albuterol (DUONEB) 0.5-2.5 (3) MG/3ML SOLN Inhale 3 mLs into the lungs every 6 (six) hours as needed (for shortness of breath or wheezing).  08/03/17  Yes [provider]  Menthol-Camphor (TIGER BALM ARTHRITIS RUB EX) Apply  1 application topically daily as needed (for back pain).   Yes [provider]  mupirocin ointment (BACTROBAN) 2 % Apply 1 application topically as needed.    Yes [provider]  OXYCONTIN 10 MG 12 hr tablet Take 10 mg by mouth 3 (three) times daily.  04/03/17  Yes [provider]  ranitidine (ZANTAC) 300 MG tablet Take 300 mg by mouth daily.  08/20/16  Yes [provider]  SPIRIVA RESPIMAT 1.25 MCG/ACT AERS Inhale 1 puff into the lungs 2 (two) times daily. 09/11/16  Yes [provider]  temazepam (RESTORIL) 30 MG capsule Take 1 capsule (30 mg total) by mouth at bedtime. 10/02/16  Yes Reyne Dumas, MD  Vitamin D, Ergocalciferol, (DRISDOL) 50000 units CAPS capsule Take 50,000 Units by mouth every Saturday.  09/17/16  Yes [provider]  meclizine (ANTIVERT) 25 MG tablet Take 1 tablet (25 mg total) by mouth 3 (three) times daily as needed for dizziness. 08/13/17   Ward, Ozella Almond, PA-C  ondansetron (ZOFRAN ODT) 4 MG disintegrating tablet Take 1 tablet (4 mg total) by mouth every 8 (eight) hours as needed for nausea or vomiting. 08/13/17   Ward, Ozella Almond, PA-C    Family History Family History  Problem Relation Age of Onset  . Stroke Mother   . Parkinson's disease Father   . Heart disease Father   . Stroke Father   . Multiple sclerosis Brother     Social History Social History   Tobacco Use  . Smoking status: Never Smoker  . Smokeless tobacco: Never Used  Substance Use Topics  . Alcohol use: No  . Drug use: No     Allergies   Tape; Celexa [citalopram hydrobromide]; and Statins   Review of Systems Review of Systems  Eyes: Negative for visual disturbance.  Gastrointestinal: Positive for nausea and vomiting. Negative for abdominal pain, constipation and diarrhea.  Neurological: Positive for dizziness. Negative for weakness, numbness and headaches.  All other systems reviewed and are negative.    Physical Exam Updated  Vital Signs BP (!) 168/97   Pulse 72   Temp 97.8 F (36.6 C)   Resp 18   Ht 4\' 10"  (1.473 m)   Wt 83.9 kg (185 lb)   SpO2 100%   BMI 38.67 kg/m   Physical Exam  Constitutional: She is oriented to person, place, and time. She appears well-developed and well-nourished. No distress.  HENT:  Head: Normocephalic and atraumatic.  Cardiovascular: Normal rate, regular rhythm and normal heart sounds.  No murmur heard. Pulmonary/Chest: Effort normal and breath sounds normal. No respiratory distress.  Abdominal: Soft. She exhibits no distension. There is no tenderness.  Musculoskeletal: Normal range of motion.  Neurological: She is alert and oriented to person, place, and time.  Speech clear and goal oriented. CN 2-12 grossly intact. Normal finger-to-nose and rapid alternating movements. No drift. Strength and sensation intact.  Skin:  Skin is warm and dry.  Nursing note and vitals reviewed.    ED Treatments / Results  Labs (all labs ordered are listed, but only abnormal results are displayed) Labs Reviewed  CBC - Abnormal; Notable for the following components:      Result Value   WBC 13.9 (*)    All other components within normal limits  DIFFERENTIAL - Abnormal; Notable for the following components:   Neutro Abs 11.3 (*)    All other components within normal limits  COMPREHENSIVE METABOLIC PANEL - Abnormal; Notable for the following components:   Glucose, Bld 154 (*)    AST 48 (*)    All other components within normal limits  PROTIME-INR  APTT    EKG EKG Interpretation  Date/Time:  Tuesday August 13 2017 16:53:23 EDT Ventricular Rate:  63 PR Interval:    QRS Duration: 101 QT Interval:  456 QTC Calculation: 467 R Axis:   -5 Text Interpretation:  Sinus rhythm Low voltage, precordial leads Abnormal R-wave progression, early transition Confirmed by Dene Gentry [phone]) on 08/13/2017 4:57:19 PM   Radiology Mr Brain Wo Contrast  Result Date: 08/13/2017 CLINICAL DATA:   Nausea and vomiting. EXAM: MRI HEAD WITHOUT CONTRAST TECHNIQUE: Multiplanar, multiecho pulse sequences of the brain and surrounding structures were obtained without intravenous contrast. COMPARISON:  Head CT 08/09/2017 Brain MRI 08/08/2017 FINDINGS: BRAIN: The midline structures are normal. There is no acute infarct or acute hemorrhage. No mass lesion, hydrocephalus, dural abnormality or extra-axial collection. Old bilateral basal ganglia lacunar infarcts and old left middle cerebellar peduncle infarct are unchanged. Chronic signal abnormality at the left temporoparietal junction is unchanged. No age-advanced or lobar predominant atrophy. No chronic microhemorrhage or superficial siderosis. VASCULAR: Major intracranial arterial and venous sinus flow voids are preserved. SKULL AND UPPER CERVICAL SPINE: The visualized skull base, calvarium, upper cervical spine and extracranial soft tissues are normal. SINUSES/ORBITS: No fluid levels or advanced mucosal thickening. No mastoid or middle ear effusion. Normal orbits. IMPRESSION: Unchanged examination with multiple old lacunar infarcts and chronic signal abnormality at the left temporoparietal junction. No acute abnormality. Electronically Signed   By: Ulyses Jarred M.D.   On: 08/13/2017 19:46    Procedures Procedures (including critical care time)  Medications Ordered in ED Medications  oxyCODONE (OXYCONTIN) 12 hr tablet 10 mg (10 mg Oral Given 08/13/17 2102)  LORazepam (ATIVAN) injection 1 mg (1 mg Intravenous Given 08/13/17 1647)  ondansetron (ZOFRAN) injection 4 mg (4 mg Intravenous Given 08/13/17 1646)  sodium chloride 0.9 % bolus 500 mL (0 mLs Intravenous Stopped 08/13/17 1851)  meclizine (ANTIVERT) tablet 25 mg (25 mg Oral Given 08/13/17 2102)  meclizine (ANTIVERT) tablet 25 mg (25 mg Oral Given 08/13/17 2216)     Initial Impression / Assessment and Plan / ED Course  I have reviewed the triage vital signs and the nursing notes.  Pertinent labs &  imaging results that were available during my care of the patient were reviewed by me and considered in my medical decision making (see chart for details).    ELIANAH KARIS is a 52 y.o. female who presents to ED for dizziness which began today. Hx of similar and admitted on 4/11 for dizziness.  Reviewed extensively.  On 4/11 admission, patient with MRI showing early subacute left basal ganglia infarct as well as other old infarcts.  She was evaluated by neurology during that admission.  It appears neurology felt this was equivocal, likely not an infarct shown on MRI.  Labs reviewed and  reassuring.  She does have a nonspecific leukocytosis of 13.9.  4:32 PM - Discussed case with neurology, Dr. Rory Percy, who recommends obtaining MR brain and call back with results.   MRI reviewed showing unchanged examination with multiple old infarcts.  No acute abnormalities.  Discussed findings with neurology, on call is now Dr. Leonel Ramsay, who states that no further neurologic work up needs to be done. She will need PT (husband reports they have already started making arrangements for this, but patient has thus far not wanted to participate). Stressed importance of PT. From neurology stand point, no need for admission given such recent and thorough work up. Will need outpatient neuro follow up which she and husband understand.   Given rx for zofran and meclizine for symptomatic improvement. Reasons to return to ER discussed. Patient discharged in stable condition with understanding of plan as dictated above.   Patient discussed with Dr. Francia Greaves who agrees with treatment plan.    Final Clinical Impressions(s) / ED Diagnoses   Final diagnoses:  Dizziness    ED Discharge Orders        Ordered    ondansetron (ZOFRAN ODT) 4 MG disintegrating tablet  Every 8 hours PRN     08/13/17 2241    meclizine (ANTIVERT) 25 MG tablet  3 times daily PRN     08/13/17 2241       Ward, Ozella Almond, PA-C 08/13/17  2353    Valarie Merino, MD 08/14/17 1300

## 2017-08-13 NOTE — ED Triage Notes (Signed)
Per ems pt was at home and had an onset of nausea/vomitting today.hx brain tumor that can't be removed.Deaf in the left ear.161/100, hr 60, 94% cbg 113

## 2017-08-13 NOTE — ED Notes (Signed)
Patient transported to MRI 

## 2017-08-13 NOTE — ED Notes (Signed)
Patient verbalizes understanding of discharge instructions. Opportunity for questioning and answers were provided. Armband removed by staff, pt discharged from ED. E signature not available.  

## 2017-08-13 NOTE — Discharge Instructions (Signed)
It was my pleasure taking care of you today!   Fortunately, your MRI today did not show any new changes.   Please follow up with the neurologist. Call tomorrow to schedule a follow up appointment.   Zofran as needed for nausea, meclizine as needed for dizziness.   Return to ER for new or worsening symptoms, any additional concerns.

## 2017-08-14 LAB — ANTIPHOSPHOLIPID SYNDROME EVAL, BLD
Anticardiolipin IgM: <9 MPL U/mL (ref 0–12)
DRVVT: 39.7 s (ref 0.0–47.0)
PHOSPHATYDALSERINE, IGA: 2 {APS'U} (ref 0–20)
PTT Lupus Anticoagulant: 49.7 s (ref 0.0–51.9)
Phosphatydalserine, IgG: 1 GPS IgG (ref 0–11)
Phosphatydalserine, IgM: 17 MPS IgM (ref 0–25)

## 2017-08-19 DIAGNOSIS — H9192 Unspecified hearing loss, left ear: Secondary | ICD-10-CM | POA: Diagnosis not present

## 2017-08-19 DIAGNOSIS — I639 Cerebral infarction, unspecified: Secondary | ICD-10-CM | POA: Diagnosis not present

## 2017-08-19 DIAGNOSIS — G510 Bell's palsy: Secondary | ICD-10-CM | POA: Diagnosis not present

## 2017-08-19 DIAGNOSIS — I10 Essential (primary) hypertension: Secondary | ICD-10-CM | POA: Diagnosis not present

## 2017-08-19 DIAGNOSIS — R110 Nausea: Secondary | ICD-10-CM | POA: Diagnosis not present

## 2017-08-19 DIAGNOSIS — Z6836 Body mass index (BMI) 36.0-36.9, adult: Secondary | ICD-10-CM | POA: Diagnosis not present

## 2017-08-19 DIAGNOSIS — Z8673 Personal history of transient ischemic attack (TIA), and cerebral infarction without residual deficits: Secondary | ICD-10-CM | POA: Diagnosis not present

## 2017-08-28 DIAGNOSIS — I10 Essential (primary) hypertension: Secondary | ICD-10-CM | POA: Diagnosis not present

## 2017-08-28 DIAGNOSIS — M5412 Radiculopathy, cervical region: Secondary | ICD-10-CM | POA: Diagnosis not present

## 2017-08-28 DIAGNOSIS — Z79899 Other long term (current) drug therapy: Secondary | ICD-10-CM | POA: Diagnosis not present

## 2017-08-28 DIAGNOSIS — F1120 Opioid dependence, uncomplicated: Secondary | ICD-10-CM | POA: Diagnosis not present

## 2017-08-28 DIAGNOSIS — I69153 Hemiplegia and hemiparesis following nontraumatic intracerebral hemorrhage affecting right non-dominant side: Secondary | ICD-10-CM | POA: Diagnosis not present

## 2017-08-28 DIAGNOSIS — M542 Cervicalgia: Secondary | ICD-10-CM | POA: Diagnosis not present

## 2017-08-28 DIAGNOSIS — G894 Chronic pain syndrome: Secondary | ICD-10-CM | POA: Diagnosis not present

## 2017-08-28 DIAGNOSIS — I679 Cerebrovascular disease, unspecified: Secondary | ICD-10-CM | POA: Diagnosis not present

## 2017-09-05 ENCOUNTER — Ambulatory Visit (INDEPENDENT_AMBULATORY_CARE_PROVIDER_SITE_OTHER): Payer: Medicare HMO | Admitting: *Deleted

## 2017-09-05 DIAGNOSIS — I639 Cerebral infarction, unspecified: Secondary | ICD-10-CM

## 2017-09-06 LAB — CUP PACEART REMOTE DEVICE CHECK
MDC IDC PG IMPLANT DT: 20180604
MDC IDC SESS DTM: 20190407023531

## 2017-09-09 ENCOUNTER — Encounter: Payer: Self-pay | Admitting: Neurology

## 2017-09-09 ENCOUNTER — Ambulatory Visit: Payer: Medicare HMO | Admitting: Neurology

## 2017-09-09 VITALS — BP 155/87 | HR 73 | Ht <= 58 in | Wt 180.8 lb

## 2017-09-09 DIAGNOSIS — I10 Essential (primary) hypertension: Secondary | ICD-10-CM | POA: Diagnosis not present

## 2017-09-09 DIAGNOSIS — H9192 Unspecified hearing loss, left ear: Secondary | ICD-10-CM

## 2017-09-09 DIAGNOSIS — I63342 Cerebral infarction due to thrombosis of left cerebellar artery: Secondary | ICD-10-CM | POA: Diagnosis not present

## 2017-09-09 DIAGNOSIS — Z8673 Personal history of transient ischemic attack (TIA), and cerebral infarction without residual deficits: Secondary | ICD-10-CM | POA: Diagnosis not present

## 2017-09-09 HISTORY — DX: Unspecified hearing loss, left ear: H91.92

## 2017-09-09 NOTE — Progress Notes (Signed)
Carelink Summary Report / Loop Recorder 

## 2017-09-09 NOTE — Patient Instructions (Addendum)
-   continue plavix for stroke prevention - need ENT follow up on 09/26/17 for left hearing loss - refer to PT for balance training.  - will schedule spinal tap and hold the plavix 5-7 days before the spinal tap. During that 5-7 days, take baby ASA 81mg . Need to rule out MS and vessel inflammation.  - Follow up with your primary care physician for stroke risk factor modification. Recommend maintain blood pressure goal <130/80, diabetes with hemoglobin A1c goal below 7.0% and lipids with LDL cholesterol goal below 70 mg/dL.  - follow up with Dr. Leta Baptist in 2 months.

## 2017-09-09 NOTE — Progress Notes (Signed)
STROKE NEUROLOGY FOLLOW UP NOTE  NAME: Lynn Rollins DOB: 1965-10-20  REASON FOR VISIT: stroke follow up HISTORY FROM: pt and chart  Today we had the pleasure of seeing Lynn Rollins in follow-up at our [facility]. Pt was accompanied by mom.   History Summary [name] is a 52 y.o. female with history of strokes, hypertension, hyperlipidemia, morbid obesity, OSA on CPAP and fibromyalgia admitted on 08/08/17 for  sudden onset of left ear hearing loss, vertigo and double vision.   Hx of stroke  Stroke in 2011 - details not clear  09/2016 - right-sided weakness admitted for left ACA infarct.  MRI also showed old bilateral BG infarct, old left temporal and left cerebellum infarct.  MRA showed left A2 occlusion.  No DVT.  EF 60-65%.  Carotid Doppler negative.  Hypercoagulable workup negative.  TEE negative and loop recorder placed.  LDL 48 and A1c 5.7.  Discharged on dual antiplatelet.  On 08/08/2017 admission, she developed sudden onset left hearing loss, vertigo and dizziness with double vision.  Hearing loss and double vision much improved second day but still felt dizzy.  MRI showed left cerebellar peduncle abnormal T2 FLAIR signal.  MRA no LVO, chronic occluded left A2 and moderate bilateral M2 stenosis.  CTA had left A2 occlusion, bilateral M2 severe irregularity new since 08/2016.  CTA neck unremarkable.  EF 60 to 65%.  Loop recorder negative for A. fib.  LDL 52 and A1c 5.9.  Hypercoagulable and autoimmune work-up negative.  Her condition was entertained with vasculitis, such as Susac's disease. Not able to do LP due to on plavix at that time. Consider outpt LP, ENT and ophthalmology referral. On 08/13/17 she went back to ED due to vertigo, and MRI no acute changes. Referred to PT, treated with zofran and meclizine.   Interval History During the interval time, the patient has been doing better. No more double vision or vertigo, however, left hearing loss minimal  improvement and continue to have mild dizziness on head movement or walking. She has to walk with walker. She also found by her PCP with left facial droop and left eye closing difficulty, considered bell's palsy and was treated with valacyclovir, but no steroids. Pt felt better since.   On today's exam, pt minimal left facial droop but still has left eye weak closing and decreased blinking, consistent with previous left peripheral VII palsy. With her left hearing loss and vertigo, lesion location consistent with left inferior cerebellar peduncle see on MRI T2 FLAIR abnormality.   REVIEW OF SYSTEMS: Full 14 system review of systems performed and notable only for those listed below and in HPI above, all others are negative:  Constitutional:   Cardiovascular:  Ear/Nose/Throat:  Hearing loss Skin:  Eyes:   Respiratory:   Gastroitestinal:   Genitourinary:  Hematology/Lymphatic:   Endocrine:  Musculoskeletal:   Allergy/Immunology:   Neurological:  dizziness Psychiatric:  Sleep:   The following represents the patient's updated allergies and side effects list: Allergies  Allergen Reactions  . Tape Rash and Other (See Comments)    Patient has Fibromyalgia and cannot tolerate medical tape and it's removal  . Celexa [Citalopram Hydrobromide] Other (See Comments)    "Made me cry and made me worse"  . Statins Other (See Comments)    Muscles cramp    The neurologically relevant items on the patient's problem list were reviewed on today's visit.  Neurologic Examination  A problem focused neurological exam (12 or more points  of the single system neurologic examination, vital signs counts as 1 point, cranial nerves count for 8 points) was performed.  Blood pressure (!) 155/87, pulse 73, height 4\' 10"  (1.473 m), weight 180 lb 12.8 oz (82 kg).  General - obese, well developed, in no apparent distress.  Ophthalmologic - Fundi not visualized due to noncooperation.  Cardiovascular - Regular  rate and rhythm.  Mental Status -  Level of arousal and orientation to time, place, and person were intact. Language including expression, naming, repetition, comprehension was assessed and found intact. Attention span and concentration were normal. Fund of Knowledge was assessed and was intact.  Cranial Nerves II - XII - II - Visual field intact OU. III, IV, VI - Extraocular movements intact. V - Facial sensation intact bilaterally. VII - Facial movement intact bilaterally. However, left eye weak closure and decreased blinking VIII - Hearing loss on the left. Right gaze unsustained nystagmus  X - Palate elevates symmetrically. XI - Chin turning & shoulder shrug intact bilaterally. XII - Tongue protrusion intact.  Motor Strength - The patient's strength was normal in all extremities and pronator drift was absent.  Bulk was normal and fasciculations were absent.   Motor Tone - Muscle tone was assessed at the neck and appendages and was normal.  Reflexes - The patient's reflexes were 1+ in all extremities and she had no pathological reflexes.  Sensory - Light touch, temperature/pinprick were assessed and were normal.    Coordination - The patient had normal movements in the hands and feet with no ataxia or dysmetria.  Tremor was absent.  Gait and Station - walk with walker and steady no ataxic gait   Functional score  mRS = 3   0 - No symptoms.   1 - No significant disability. Able to carry out all usual activities, despite some symptoms.   2 - Slight disability. Able to look after own affairs without assistance, but unable to carry out all previous activities.   3 - Moderate disability. Requires some help, but able to walk unassisted.   4 - Moderately severe disability. Unable to attend to own bodily needs without assistance, and unable to walk unassisted.   5 - Severe disability. Requires constant nursing care and attention, bedridden, incontinent.   6 - Dead.   NIH  Stroke Scale   Level Of Consciousness 0=Alert; keenly responsive 1=Not alert, but arousable by minor stimulation 2=Not alert, requires repeated stimulation 3=Responds only with reflex movements 0  LOC Questions to Month and Age 69=Answers both questions correctly 1=Answers one question correctly 2=Answers neither question correctly 0  LOC Commands      -Open/Close eyes     -Open/close grip 0=Performs both tasks correctly 1=Performs one task correctly 2=Performs neighter task correctly 0  Best Gaze 0=Normal 1=Partial gaze palsy 2=Forced deviation, or total gaze paresis 0  Visual 0=No visual loss 1=Partial hemianopia 2=Complete hemianopia 3=Bilateral hemianopia (blind including cortical blindness) 0  Facial Palsy 0=Normal symmetrical movement 1=Minor paralysis (asymmetry) 2=Partial paralysis (lower face) 3=Complete paralysis (upper and lower face) 2  Motor  0=No drift, limb holds posture for full 10 seconds 1=Drift, limb holds posture, no drift to bed 2=Some antigravity effort, cannot maintain posture, drifts to bed 3=No effort against gravity, limb falls 4=No movement Right Arm 0     Leg 0    Left Arm 0     Leg 0  Limb Ataxia 0=Absent 1=Present in one limb 2=Present in two limbs 0  Sensory 0=Normal 1=Mild to  moderate sensory loss 2=Severe to total sensory loss 0  Best Language 0=No aphasia, normal 1=Mild to moderate aphasia 2=Mute, global aphasia 3=Mute, global aphasia 0  Dysarthria 0=Normal 1=Mild to moderate 2=Severe, unintelligible or mute/anarthric 0  Extinction/Neglect 0=No abnormality 1=Extinction to bilateral simultaneous stimulation 2=Profound neglect 0  Total   2     Data reviewed: I personally reviewed the images and agree with the radiology interpretations.  Ct Angio Head W Or Wo Contrast  Result Date: 08/09/2017 CLINICAL DATA:  52 year old female with right side weakness found to have subacute left basal ganglia infarct. Chronic left ACA A2  occlusion. Moderate bilateral M2 MCA stenosis suspected on MRI yesterday. EXAM: CT ANGIOGRAPHY HEAD AND NECK TECHNIQUE: Multidetector CT imaging of the head and neck was performed using the standard protocol during bolus administration of intravenous contrast. Multiplanar CT image reconstructions and MIPs were obtained to evaluate the vascular anatomy. Carotid stenosis measurements (when applicable) are obtained utilizing NASCET criteria, using the distal internal carotid diameter as the denominator. CONTRAST:  23mL ISOVUE-370 IOPAMIDOL (ISOVUE-370) INJECTION 76% COMPARISON:  Brain MRI and MRA 08/08/2017, and earlier. FINDINGS: CT HEAD Brain: Hypodensity in the left corona radiata corresponding to the MRI described subacute lesion yesterday. More subtle chronic hypodensity elsewhere related to prior ischemia in the left ACA territory, bilateral basal ganglia, lateral left occipital lobe, left cerebellar peduncle. No midline shift, ventriculomegaly, mass effect, evidence of mass lesion, intracranial hemorrhage or evidence of cortically based acute infarction. Calvarium and skull base: Negative. Paranasal sinuses: Clear. Orbits: No acute orbit or scalp soft tissue findings. CTA NECK Skeleton: Absent maxillary and posterior mandible dentition. No acute osseous abnormality identified. Cervical spine disc and endplate degeneration. Upper chest: Negative lung apices. Superior mediastinal lipomatosis without lymphadenopathy. Other neck: Subcentimeter hypodense left thyroid nodule does not meet consensus criteria for ultrasound follow-up. Otherwise negative. No cervical lymphadenopathy. Aortic arch: 3 vessel arch configuration with no arch atherosclerosis. Right carotid system: Negative brachiocephalic artery and right CCA origin. Mild right CCA tortuosity. There is confluent calcified plaque at the posterior right ICA origin and bulge, but no stenosis results. Mild cervical right ICA tortuosity. Left carotid system: Normal  left CCA origin. Mild left CCA tortuosity. Normal left carotid bifurcation. Tortuous cervical left ICA without stenosis. Vertebral arteries: Minimal proximal right subclavian artery soft plaque without stenosis. Normal right vertebral artery origin. Mild right vertebral artery tortuosity with no stenosis to the skull base. Tortuous proximal left subclavian artery without plaque or stenosis. Normal left vertebral artery origin. Patent left vertebral artery to the skull base without stenosis. CTA HEAD Posterior circulation: Patent distal vertebral arteries without stenosis. The left is mildly dominant. There is minimal calcified plaque in the left V4 segment. Normal right PICA origin. As before, the left PICA is diminutive or absent and the left AICA is diminutive. The right AICA is normal. Patent vertebrobasilar junction and basilar artery without stenosis. Patent AICA and SCA origins. Normal left PCA origin. Fetal type right PCA origin. Diminutive or absent left posterior communicating artery. The proximal PCAs are mildly irregular. The distal PCA branches are mildly irregular. Anterior circulation: Patent ICA siphons with no atherosclerosis or stenosis. Normal ophthalmic and right posterior communicating artery origins. Patent carotid termini. Normal MCA and ACA origins. Codominant A1 segments. Normal anterior communicating artery. Chronic occlusion of the proximal left ACA A2 is unchanged since 2018 (series 16, image 25). The right ACA and its branches are within normal limits. The left MCA M1 segment and bifurcation appear within normal limits.  There is moderate to severe irregularity and stenosis of a left MCA M2 middle sylvian branch demonstrated on series 16, image 32 which is new since 2018 and stable to the MRA yesterday. The other MCA branches scratched at the other left MCA branches are within normal limits. The right MCA M1 segment and bifurcation appear normal. There is a similar segment of moderate  irregularity and stenosis in a posterior right MCA M2 branch seen on series 16, image 52 which is more apparent than on the MRA yesterday. This also appears new since 2018. There is otherwise mild right MCA branch irregularity. Venous sinuses: Patent. Anatomic variants: None. Delayed phase: No abnormal enhancement identified. Review of the MIP images confirms the above findings IMPRESSION: 1. Minimal atherosclerosis from the arch to the skull base - limited to the proximal right ICA with no stenosis. Otherwise normal appearance of the great vessel origins, and the carotid and vertebral arteries. 2. However, intracranially there is chronic left ACA A2 occlusion. And bilateral MCA M2 branch segments of moderate to severe irregularity (greater on the left) which has developed since a May 2018 MRA (see series 16, images 32 and 15). 3. The main differential considerations include disproportionate intracranial atherosclerosis, sequelae of intracranial thromboemboli, and intracranial vasculitis. 4. No acute intracranial abnormality. Stable CT appearance of the brain since the MRI yesterday. Electronically Signed   By: Genevie Ann M.D.   On: 08/09/2017 10:16   Ct Angio Neck W Or Wo Contrast  Result Date: 08/09/2017 CLINICAL DATA:  52 year old female with right side weakness found to have subacute left basal ganglia infarct. Chronic left ACA A2 occlusion. Moderate bilateral M2 MCA stenosis suspected on MRI yesterday. EXAM: CT ANGIOGRAPHY HEAD AND NECK TECHNIQUE: Multidetector CT imaging of the head and neck was performed using the standard protocol during bolus administration of intravenous contrast. Multiplanar CT image reconstructions and MIPs were obtained to evaluate the vascular anatomy. Carotid stenosis measurements (when applicable) are obtained utilizing NASCET criteria, using the distal internal carotid diameter as the denominator. CONTRAST:  34mL ISOVUE-370 IOPAMIDOL (ISOVUE-370) INJECTION 76% COMPARISON:  Brain  MRI and MRA 08/08/2017, and earlier. FINDINGS: CT HEAD Brain: Hypodensity in the left corona radiata corresponding to the MRI described subacute lesion yesterday. More subtle chronic hypodensity elsewhere related to prior ischemia in the left ACA territory, bilateral basal ganglia, lateral left occipital lobe, left cerebellar peduncle. No midline shift, ventriculomegaly, mass effect, evidence of mass lesion, intracranial hemorrhage or evidence of cortically based acute infarction. Calvarium and skull base: Negative. Paranasal sinuses: Clear. Orbits: No acute orbit or scalp soft tissue findings. CTA NECK Skeleton: Absent maxillary and posterior mandible dentition. No acute osseous abnormality identified. Cervical spine disc and endplate degeneration. Upper chest: Negative lung apices. Superior mediastinal lipomatosis without lymphadenopathy. Other neck: Subcentimeter hypodense left thyroid nodule does not meet consensus criteria for ultrasound follow-up. Otherwise negative. No cervical lymphadenopathy. Aortic arch: 3 vessel arch configuration with no arch atherosclerosis. Right carotid system: Negative brachiocephalic artery and right CCA origin. Mild right CCA tortuosity. There is confluent calcified plaque at the posterior right ICA origin and bulge, but no stenosis results. Mild cervical right ICA tortuosity. Left carotid system: Normal left CCA origin. Mild left CCA tortuosity. Normal left carotid bifurcation. Tortuous cervical left ICA without stenosis. Vertebral arteries: Minimal proximal right subclavian artery soft plaque without stenosis. Normal right vertebral artery origin. Mild right vertebral artery tortuosity with no stenosis to the skull base. Tortuous proximal left subclavian artery without plaque or stenosis. Normal left  vertebral artery origin. Patent left vertebral artery to the skull base without stenosis. CTA HEAD Posterior circulation: Patent distal vertebral arteries without stenosis. The left  is mildly dominant. There is minimal calcified plaque in the left V4 segment. Normal right PICA origin. As before, the left PICA is diminutive or absent and the left AICA is diminutive. The right AICA is normal. Patent vertebrobasilar junction and basilar artery without stenosis. Patent AICA and SCA origins. Normal left PCA origin. Fetal type right PCA origin. Diminutive or absent left posterior communicating artery. The proximal PCAs are mildly irregular. The distal PCA branches are mildly irregular. Anterior circulation: Patent ICA siphons with no atherosclerosis or stenosis. Normal ophthalmic and right posterior communicating artery origins. Patent carotid termini. Normal MCA and ACA origins. Codominant A1 segments. Normal anterior communicating artery. Chronic occlusion of the proximal left ACA A2 is unchanged since 2018 (series 16, image 25). The right ACA and its branches are within normal limits. The left MCA M1 segment and bifurcation appear within normal limits. There is moderate to severe irregularity and stenosis of a left MCA M2 middle sylvian branch demonstrated on series 16, image 32 which is new since 2018 and stable to the MRA yesterday. The other MCA branches scratched at the other left MCA branches are within normal limits. The right MCA M1 segment and bifurcation appear normal. There is a similar segment of moderate irregularity and stenosis in a posterior right MCA M2 branch seen on series 16, image 52 which is more apparent than on the MRA yesterday. This also appears new since 2018. There is otherwise mild right MCA branch irregularity. Venous sinuses: Patent. Anatomic variants: None. Delayed phase: No abnormal enhancement identified. Review of the MIP images confirms the above findings IMPRESSION: 1. Minimal atherosclerosis from the arch to the skull base - limited to the proximal right ICA with no stenosis. Otherwise normal appearance of the great vessel origins, and the carotid and vertebral  arteries. 2. However, intracranially there is chronic left ACA A2 occlusion. And bilateral MCA M2 branch segments of moderate to severe irregularity (greater on the left) which has developed since a May 2018 MRA (see series 16, images 32 and 15). 3. The main differential considerations include disproportionate intracranial atherosclerosis, sequelae of intracranial thromboemboli, and intracranial vasculitis. 4. No acute intracranial abnormality. Stable CT appearance of the brain since the MRI yesterday. Electronically Signed   By: Genevie Ann M.D.   On: 08/09/2017 10:16   Mr Angiogram Head Wo Contrast  Result Date: 08/08/2017 CLINICAL DATA:  RIGHT-sided weakness beginning last night, intermittent diplopia and decreased LEFT hearing with severe headache. History of hypertension, stroke. EXAM: MRI HEAD WITHOUT CONTRAST MRA HEAD WITHOUT CONTRAST TECHNIQUE: Multiplanar, multiecho pulse sequences of the brain and surrounding structures were obtained without intravenous contrast. Angiographic images of the head were obtained using MRA technique without contrast. COMPARISON:  MRI/MRA head Sep 27, 2016 FINDINGS: MRI HEAD FINDINGS INTRACRANIAL CONTENTS: Very faint reduced diffusion associated with LEFT basal ganglia infarct, predominant T2 shine through and faint susceptibility artifact. No reduced diffusion to suggest acute ischemia. Patchy FLAIR T2 hyperintense signal LEFT posterior temporal lobe similar to prior examination. Old bilateral basal ganglia lacunar infarcts. Old LEFT brachium pontis to cerebellar hemisphere infarct. Minimal mesial LEFT frontal lobe encephalomalacia. Scattered subcentimeter supratentorial white matter hypodensities. No midline shift, mass effect or masses. No abnormal extra-axial fluid collections. VASCULAR: Normal major intracranial vascular flow voids present at skull base. SKULL AND UPPER CERVICAL SPINE: No abnormal sellar expansion. No suspicious  calvarial bone marrow signal.  Craniocervical junction maintained. SINUSES/ORBITS: The mastoid air-cells and included paranasal sinuses are well-aerated.The included ocular globes and orbital contents are non-suspicious. OTHER: None. MRA HEAD FINDINGS ANTERIOR CIRCULATION: Normal flow related enhancement of the included cervical, petrous, cavernous and supraclinoid internal carotid arteries. Patent fenestrated anterior communicating artery. 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 Moderate stenosis LEFT M2 segment (superior division) and RIGHT M2 segment (inferior division) worse than prior MRI. Anterior and middle cerebral arteries are otherwise patent. No emergent large vessel occlusion, aneurysm. POSTERIOR CIRCULATION: Nearly codominant vertebral arteries. Basilar artery is patent, with normal flow related enhancement of the main branch vessels. Patent posterior cerebral arteries. Fetal origin RIGHT posterior cerebral artery. No large vessel occlusion, flow limiting stenosis,  aneurysm. ANATOMIC VARIANTS: None. Source images and MIP images were reviewed. IMPRESSION: MRI HEAD: 1. New early subacute LEFT basal ganglia infarct. 2. Old bilateral basal ganglia lacunar infarcts. 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 Old LEFT frontal/ACA and old LEFT temporal/MCA territory infarcts. 3. Mild chronic small vessel ischemic disease. MRA HEAD: 1. No emergent large vessel occlusion. Chronically occluded LEFT A2 segment. 2. Moderate stenosis bilateral M2 segments. Electronically Signed   By: Elon Alas M.D.   On: 08/08/2017 19:30   Mr Brain Wo Contrast  Result Date: 08/08/2017 CLINICAL DATA:  RIGHT-sided weakness beginning last night, intermittent diplopia and decreased LEFT hearing with severe headache. History of hypertension, stroke. EXAM: MRI HEAD WITHOUT CONTRAST MRA HEAD WITHOUT CONTRAST TECHNIQUE: Multiplanar, multiecho pulse sequences of the brain and surrounding structures were obtained without  intravenous contrast. Angiographic images of the head were obtained using MRA technique without contrast. COMPARISON:  MRI/MRA head Sep 27, 2016 FINDINGS: MRI HEAD FINDINGS INTRACRANIAL CONTENTS: Very faint reduced diffusion associated with LEFT basal ganglia infarct, predominant T2 shine through and faint susceptibility artifact. No reduced diffusion to suggest acute ischemia. Patchy FLAIR T2 hyperintense signal LEFT posterior temporal lobe similar to prior examination. Old bilateral basal ganglia lacunar infarcts. Old LEFT brachium pontis to cerebellar hemisphere infarct. Minimal mesial LEFT frontal lobe encephalomalacia. Scattered subcentimeter supratentorial white matter hypodensities. No midline shift, mass effect or masses. No abnormal extra-axial fluid collections. VASCULAR: Normal major intracranial vascular flow voids present at skull base. SKULL AND UPPER CERVICAL SPINE: No abnormal sellar expansion. No suspicious calvarial bone marrow signal. Craniocervical junction maintained. SINUSES/ORBITS: The mastoid air-cells and included paranasal sinuses are well-aerated.The included ocular globes and orbital contents are non-suspicious. OTHER: None. MRA HEAD FINDINGS ANTERIOR CIRCULATION: Normal flow related enhancement of the included cervical, petrous, cavernous and supraclinoid internal carotid arteries. Patent fenestrated anterior communicating artery. 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 Moderate stenosis LEFT M2 segment (superior division) and RIGHT M2 segment (inferior division) worse than prior MRI. Anterior and middle cerebral arteries are otherwise patent. No emergent large vessel occlusion, aneurysm. POSTERIOR CIRCULATION: Nearly codominant vertebral arteries. Basilar artery is patent, with normal flow related enhancement of the main branch vessels. Patent posterior cerebral arteries. Fetal origin RIGHT posterior cerebral artery. No large vessel occlusion, flow limiting  stenosis,  aneurysm. ANATOMIC VARIANTS: None. Source images and MIP images were reviewed. IMPRESSION: MRI HEAD: 1. New early subacute LEFT basal ganglia infarct. 2. Old bilateral basal ganglia lacunar infarcts. 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 Old LEFT frontal/ACA and old LEFT temporal/MCA territory infarcts. 3. Mild chronic small vessel ischemic disease. MRA HEAD: 1. No emergent large vessel occlusion. Chronically occluded LEFT A2 segment. 2. Moderate stenosis bilateral M2 segments. Electronically Signed   By: Elon Alas  M.D.   On: 08/08/2017 19:30  However, by my reading, left inferior cerebellar peduncle abnormal T2 FLAIR signal.    2D echocardiogram - Left ventricle: The cavity size was normal. Wall thickness was normal. Systolic function was normal. The estimated ejection fraction was in the range of 60% to 65%. Wall motion was normal; there were no regional wall motion abnormalities. Impressions: - Normal LV function; no significant valvular disease.  Mr Brain Wo Contrast  Result Date: 08/13/2017 CLINICAL DATA:  Nausea and vomiting. EXAM: MRI HEAD WITHOUT CONTRAST TECHNIQUE: Multiplanar, multiecho pulse sequences of the brain and surrounding structures were obtained without intravenous contrast. COMPARISON:  Head CT 08/09/2017 Brain MRI 08/08/2017 FINDINGS: BRAIN: The midline structures are normal. There is no acute infarct or acute hemorrhage. No mass lesion, hydrocephalus, dural abnormality or extra-axial collection. Old bilateral basal ganglia lacunar infarcts and old left middle cerebellar peduncle infarct are unchanged. Chronic signal abnormality at the left temporoparietal junction is unchanged. No age-advanced or lobar predominant atrophy. No chronic microhemorrhage or superficial siderosis. VASCULAR: Major intracranial arterial and venous sinus flow voids are preserved. SKULL AND UPPER CERVICAL SPINE: The visualized skull base, calvarium, upper cervical spine and  extracranial soft tissues are normal. SINUSES/ORBITS: No fluid levels or advanced mucosal thickening. No mastoid or middle ear effusion. Normal orbits. IMPRESSION: Unchanged examination with multiple old lacunar infarcts and chronic signal abnormality at the left temporoparietal junction. No acute abnormality. Electronically Signed   By: Ulyses Jarred M.D.   On: 08/13/2017 19:46  However, by my reading, left inferior cerebellar peduncle abnormal T2 FLAIR signal.    Assessment: As you may recall, she is a 52 y.o. Caucasian female with PMH of history of strokes, hypertension, hyperlipidemia, morbid obesity, OSA on CPAP and fibromyalgia admitted on 08/08/17 for sudden onset of left ear hearing loss, vertigo and double vision. MRI showed left cerebellar peduncle abnormal T2 FLAIR signal.  MRA no LVO, chronic occluded left A2 and moderate bilateral M2 stenosis.  CTA had left A2 occlusion, bilateral M2 severe irregularity new since 08/2016.  CTA neck unremarkable.  EF 60 to 65%.  Loop recorder negative for A. fib.  LDL 52 and A1c 5.9.  Hypercoagulable and autoimmune work-up negative.  Her condition was entertained with vasculitis, such as Susac's disease. Not able to do LP due to on plavix at that time. Consider outpt LP, ENT and ophthalmology referral. On 08/13/17 she went back to ED due to vertigo, and MRI no acute changes. Referred to PT, treated with zofran and meclizine. Currently, left hearing loss has minimal improvement and she continue to have mild dizziness on head movement or walking. She also found by her PCP with left facial droop and left eye closing difficulty, considered bell's palsy and was treated with valacyclovir, but no steroids.   Hx of stroke  Stroke in 2011 - details not clear  09/2016 - right-sided weakness admitted for left ACA infarct.  MRI also showed old bilateral BG infarct, old left temporal and left cerebellum infarct.  MRA showed left A2 occlusion.  No DVT.  EF 60-65%.  Carotid  Doppler negative.  Hypercoagulable workup negative.  TEE negative and loop recorder placed.  LDL 48 and A1c 5.7.  Discharged on dual antiplatelet.  On today's exam, pt minimal left facial droop but still has left eye weak closing and decreased blinking, consistent with previous left peripheral VII palsy. With her left hearing loss and vertigo, lesion location consistent with left inferior cerebellar peduncle see on MRI T2 FLAIR  abnormality. Given her stroke risk factors including obesity and HTN, intracranial stenosis with old infarcts, pt stroke most likely small vs. Large vessel athero. However, need to rule out vasculitis or MS. Will need to do LP and continue ENT appointment.   Plan:  - continue plavix for stroke prevention - continue ENT appointment on 09/26/17 for left hearing loss - refer to PT for balance training.  - will schedule LP and hold the plavix 5-7 days before LP. During that 5-7 days, take baby ASA 81mg . Need to rule out MS and vasculitis.  - Follow up with your primary care physician for stroke risk factor modification. Recommend maintain blood pressure goal <130/80, diabetes with hemoglobin A1c goal below 7.0% and lipids with LDL cholesterol goal below 70 mg/dL.  - follow up with Dr. Leta Baptist in 2 months.   A total of 45 minutes was spent face-to-face with this patient. Over half this time was spent on counseling patient on the vertigo, hearing loss, and double vision diagnosis and different diagnostic and therapeutic options available.   Orders Placed This Encounter  Procedures  . DG FLUORO GUIDED LOC OF NEEDLE/CATH TIP FOR SPINAL INJECT LT    Standing Status:   Future    Standing Expiration Date:   11/10/2018    Scheduling Instructions:     Pt is on plavix, please stop plavix 5-7 days prior to the LP. Pt may take ASA 81mg  during the days off plavix. Thanks.    Order Specific Question:   Lab orders requested (DO NOT place separate lab orders, these will be automatically  ordered during procedure specimen collection):    Answer:   CSF cell count w differential    Order Specific Question:   Lab orders requested (DO NOT place separate lab orders, these will be automatically ordered during procedure specimen collection):    Answer:   Gram stain    Order Specific Question:   Lab orders requested (DO NOT place separate lab orders, these will be automatically ordered during procedure specimen collection):    Answer:   Protein and glucose, CSF    Order Specific Question:   Lab orders requested (DO NOT place separate lab orders, these will be automatically ordered during procedure specimen collection):    Answer:   VDRL, CSF    Order Specific Question:   Lab orders requested (DO NOT place separate lab orders, these will be automatically ordered during procedure specimen collection):    Answer:   Oligo clonal bands    Order Specific Question:   Reason for Exam (SYMPTOM  OR DIAGNOSIS REQUIRED)    Answer:   vertigo and hearing loss    Order Specific Question:   Is the patient pregnant?    Answer:   No    Order Specific Question:   Preferred Imaging Location?    Answer:   [id number]    Order Specific Question:   Radiology Contrast Protocol - do NOT remove file path    Answer:   [email or web address]  . Ambulatory referral to Physical Therapy    Referral Priority:   Routine    Referral Type:   Physical Medicine    Referral Reason:   Specialty Services Required    Requested Specialty:   Physical Therapy    Number of Visits Requested:   1    No orders of the defined types were placed in this encounter.   Patient Instructions  - continue plavix for stroke prevention - need ENT follow up  on 09/26/17 for left hearing loss - refer to PT for balance training.  - will schedule spinal tap and hold the plavix 5-7 days before the spinal tap. During that 5-7 days, take baby ASA 81mg . Need to rule out MS and vessel inflammation.  -  Follow up with your primary care physician for stroke risk factor modification. Recommend maintain blood pressure goal <130/80, diabetes with hemoglobin A1c goal below 7.0% and lipids with LDL cholesterol goal below 70 mg/dL.  - follow up with Dr. Leta Baptist in 2 months.    Rosalin Hawking, MD PhD [facility] [address], [place], [place] [address] [phone]

## 2017-09-19 ENCOUNTER — Telehealth: Payer: Self-pay | Admitting: Neurology

## 2017-09-19 NOTE — Telephone Encounter (Signed)
Called patient to schedule 2-3 months f/u w/ Janett Billow, NP per Dr. Erlinda Hong.  LVM to schedule.

## 2017-09-26 DIAGNOSIS — H9042 Sensorineural hearing loss, unilateral, left ear, with unrestricted hearing on the contralateral side: Secondary | ICD-10-CM

## 2017-09-26 DIAGNOSIS — R42 Dizziness and giddiness: Secondary | ICD-10-CM | POA: Diagnosis not present

## 2017-09-26 HISTORY — DX: Sensorineural hearing loss, unilateral, left ear, with unrestricted hearing on the contralateral side: H90.42

## 2017-09-27 ENCOUNTER — Ambulatory Visit
Admission: RE | Admit: 2017-09-27 | Discharge: 2017-09-27 | Disposition: A | Discharge status: Home / Self Care | Payer: Medicare HMO | Source: Ambulatory Visit | Attending: Neurology | Admitting: Neurology

## 2017-09-27 VITALS — BP 182/80 | HR 64

## 2017-09-27 DIAGNOSIS — H9192 Unspecified hearing loss, left ear: Secondary | ICD-10-CM | POA: Diagnosis not present

## 2017-09-27 DIAGNOSIS — I63342 Cerebral infarction due to thrombosis of left cerebellar artery: Secondary | ICD-10-CM

## 2017-09-27 NOTE — Discharge Instructions (Signed)
Lumbar Puncture Discharge Instructions  1. Go home and rest quietly for the next 24 hours.  It is important to lie flat for the next 24 hours.  Get up only to go to the restroom.  You may lie in the bed or on a couch on your back, your stomach, your left side or your right side.  You may have one pillow under your head.  You may have pillows between your knees while you are on your side or under your knees while you are on your back.  2. DO NOT drive today.  Recline the seat as far back as it will go, while still wearing your seat belt, on the way home.  3. You may get up to go to the bathroom as needed.  You may sit up for 10 minutes to eat.  You may resume your normal diet and medications unless otherwise indicated.  Drink lots of extra fluids today and tomorrow.  4. The incidence of headache, nausea, or vomiting is about 5% (one in 20 patients).  If you develop a headache, lie flat and drink plenty of fluids until the headache goes away.  Caffeinated beverages may be helpful.  If you develop severe nausea and vomiting or a headache that does not go away with flat bed rest, call the physician who sent you here.   5. You may resume normal activities after your 24 hours of bed rest is over; however, do not exert yourself strongly or do any heavy lifting tomorrow.    MAY RESUME PLAVIX TODAY.  6. Call your physician for a follow-up appointment.   7. If you have any questions  after you arrive home, please call [phone].  Discharge instructions have been explained to the patient.  The patient, or the person responsible for the patient, fully understands these instructions.

## 2017-09-27 NOTE — Progress Notes (Signed)
Blood drawn from right AC to go with spinal fluid. Pt tolerated procedure well and site is unremarkable. Discharge instructions explained to pt.

## 2017-09-30 LAB — CUP PACEART REMOTE DEVICE CHECK
Implantable Pulse Generator Implant Date: 20180604
MDC IDC SESS DTM: 20190510033519

## 2017-10-03 ENCOUNTER — Telehealth: Payer: Self-pay | Admitting: Neurology

## 2017-10-03 DIAGNOSIS — I10 Essential (primary) hypertension: Secondary | ICD-10-CM | POA: Diagnosis not present

## 2017-10-03 DIAGNOSIS — Z6835 Body mass index (BMI) 35.0-35.9, adult: Secondary | ICD-10-CM | POA: Diagnosis not present

## 2017-10-03 DIAGNOSIS — J449 Chronic obstructive pulmonary disease, unspecified: Secondary | ICD-10-CM | POA: Diagnosis not present

## 2017-10-03 DIAGNOSIS — I639 Cerebral infarction, unspecified: Secondary | ICD-10-CM | POA: Diagnosis not present

## 2017-10-03 DIAGNOSIS — R7301 Impaired fasting glucose: Secondary | ICD-10-CM | POA: Diagnosis not present

## 2017-10-03 DIAGNOSIS — E785 Hyperlipidemia, unspecified: Secondary | ICD-10-CM | POA: Diagnosis not present

## 2017-10-03 DIAGNOSIS — R21 Rash and other nonspecific skin eruption: Secondary | ICD-10-CM | POA: Diagnosis not present

## 2017-10-03 NOTE — Telephone Encounter (Signed)
Pt requesting a call as soon as LP results are available

## 2017-10-04 LAB — CSF CELL COUNT WITH DIFFERENTIAL
Basophils, %: 0 %
Eosinophils, CSF: 0 %
Lymphs, CSF: 92 % — ABNORMAL HIGH (ref 40–80)
Monocyte/Macrophage: 8 % — ABNORMAL LOW (ref 15–45)
RBC COUNT CSF: 0 {cells}/uL (ref 0–10)
SEGMENTED NEUTROPHILS-CSF: 0 % (ref 0–6)
WBC CSF: 7 {cells}/uL — AB (ref 0–5)

## 2017-10-04 LAB — GRAM STAIN
MICRO NUMBER:: 90657634
SPECIMEN QUALITY:: ADEQUATE

## 2017-10-04 LAB — OLIGOCLONAL BANDS, CSF + SERM

## 2017-10-04 LAB — VDRL, CSF: VDRL Quant, CSF: NONREACTIVE

## 2017-10-04 LAB — PROTEIN, CSF: TOTAL PROTEIN, CSF: 44 mg/dL (ref 15–45)

## 2017-10-04 LAB — GLUCOSE, CSF: GLUCOSE CSF: 60 mg/dL (ref 40–80)

## 2017-10-04 NOTE — Telephone Encounter (Signed)
Left vm for patient. Dr.Xu notified nurse that all of patients labs are not back. We are still waiting for oliogoclonal bands lab.

## 2017-10-07 ENCOUNTER — Telehealth: Payer: Self-pay

## 2017-10-07 NOTE — Telephone Encounter (Signed)
RN was calling patient about LP. Pt stated she was still off balance, dizzy, and has hearing issues. RN stated if her hearing is a issue to contact the MD who saw her on 09/26/2017. PT stated she will see the hearing Md in July for 2 month follow up. Rn ask pt if she ever attend her PT sessions. PT stated no one call her. RN gave pt the number to next door neuro rehab. Rn stated this is what Dr. Erlinda Hong recommend. PT verbalized understanding.

## 2017-10-07 NOTE — Telephone Encounter (Signed)
Notes recorded by Marval Regal, RN on 10/07/2017 at 8:28 AM EDT Left vm for patient that the final report for LP results are back. ------

## 2017-10-07 NOTE — Telephone Encounter (Signed)
-----   Message from Rosalin Hawking, MD sent at 10/04/2017 12:41 PM EDT ----- Could you please let the patient know that the spinal tab tests done were essentially unremarkable. No medication change needed at this time. Please continue current treatment. Thanks.  Rosalin Hawking, MD PhD Stroke Neurology 10/04/2017 12:41 PM

## 2017-10-07 NOTE — Telephone Encounter (Signed)
Pt has returned the call to RN Katrina.  Pt is asking for a call back with results

## 2017-10-07 NOTE — Telephone Encounter (Signed)
Notes recorded by Marval Regal, RN on 10/07/2017 at 11:34 AM EDT Rn return patients number. The lab work was unremarkable from the LP. NO medication change. Continue treatment plan. Pt verbalized understanding. ------

## 2017-10-08 ENCOUNTER — Ambulatory Visit (INDEPENDENT_AMBULATORY_CARE_PROVIDER_SITE_OTHER): Payer: Medicare HMO | Admitting: *Deleted

## 2017-10-08 ENCOUNTER — Telehealth: Payer: Self-pay | Admitting: Neurology

## 2017-10-08 DIAGNOSIS — I639 Cerebral infarction, unspecified: Secondary | ICD-10-CM

## 2017-10-08 NOTE — Telephone Encounter (Signed)
Pt called is it ok to use CBD oil?? Please call to advise

## 2017-10-09 NOTE — Telephone Encounter (Signed)
IF patient calls back give her the message below thanks:   Left vm for patient to give her Janett Billow NP advice on CBD oil. RN stated per JEssica stroke NP there is not enough research to state she can use the CBD oil. Rn stated on vm for patient to contact her PCP to see if he can advised her on it.

## 2017-10-09 NOTE — Telephone Encounter (Signed)
RN spoke with Janett Billow NP about pt wanting to know if she can take CBD oil. Per Janett Billow NP there is not enough research for her to say patient can use it. Janett Billow NP stated pt should contact her PCP, and consult with them.Rn will call patient to relay her the message.

## 2017-10-09 NOTE — Progress Notes (Signed)
Carelink Summary Report / Loop Recorder 

## 2017-10-10 ENCOUNTER — Ambulatory Visit: Payer: Self-pay | Admitting: Adult Health

## 2017-10-18 DIAGNOSIS — H5213 Myopia, bilateral: Secondary | ICD-10-CM | POA: Diagnosis not present

## 2017-10-23 DIAGNOSIS — I10 Essential (primary) hypertension: Secondary | ICD-10-CM | POA: Diagnosis not present

## 2017-10-23 DIAGNOSIS — M542 Cervicalgia: Secondary | ICD-10-CM | POA: Diagnosis not present

## 2017-10-23 DIAGNOSIS — Z79899 Other long term (current) drug therapy: Secondary | ICD-10-CM | POA: Diagnosis not present

## 2017-10-23 DIAGNOSIS — M5412 Radiculopathy, cervical region: Secondary | ICD-10-CM | POA: Diagnosis not present

## 2017-10-23 DIAGNOSIS — F1120 Opioid dependence, uncomplicated: Secondary | ICD-10-CM | POA: Diagnosis not present

## 2017-10-23 DIAGNOSIS — G894 Chronic pain syndrome: Secondary | ICD-10-CM | POA: Diagnosis not present

## 2017-10-23 DIAGNOSIS — I679 Cerebrovascular disease, unspecified: Secondary | ICD-10-CM | POA: Diagnosis not present

## 2017-10-23 DIAGNOSIS — I69153 Hemiplegia and hemiparesis following nontraumatic intracerebral hemorrhage affecting right non-dominant side: Secondary | ICD-10-CM | POA: Diagnosis not present

## 2017-11-07 ENCOUNTER — Telehealth: Payer: Self-pay | Admitting: Adult Health

## 2017-11-07 DIAGNOSIS — R42 Dizziness and giddiness: Secondary | ICD-10-CM | POA: Diagnosis not present

## 2017-11-07 DIAGNOSIS — H9042 Sensorineural hearing loss, unilateral, left ear, with unrestricted hearing on the contralateral side: Secondary | ICD-10-CM | POA: Diagnosis not present

## 2017-11-07 NOTE — Telephone Encounter (Signed)
Received vision examination results from [facility] with Dr. Loreta Ave with complaints of "Pt states she changes CL everyday and does not sleep with them on. Pt states some headaches, OU just wears CL no specs. Pt states blurred vision from up close and a distance without CL."  Assessment per note: 1. Myopia with presbyopia OU -new Rx for spex -call if S/S RD or LOV occur 2. CL refit [facility] dailies -issue trials, pt can order if happy -pt can go back to -1.75 OU if needed 3. Hx of stroke -2016, August 08, 2017  Plan per note: 1. Recall in 1 year for annual eye exam 2. Pt edu on signs and/or symptoms of retinal holes, tears or detachments. RTC immediately if occur 3. Update spec RX today 4. 100% UV Edu/Mulitvitamin Edu 5. Update CL RX today. Pt Re-Edu on CL rules and wear times 6. Send report to Dr. Delena Bali and [facility]

## 2017-11-11 ENCOUNTER — Ambulatory Visit (INDEPENDENT_AMBULATORY_CARE_PROVIDER_SITE_OTHER): Payer: Medicare HMO | Admitting: *Deleted

## 2017-11-11 DIAGNOSIS — I639 Cerebral infarction, unspecified: Secondary | ICD-10-CM | POA: Diagnosis not present

## 2017-11-11 NOTE — Progress Notes (Signed)
Carelink Summary Report / Loop Recorder 

## 2017-11-14 LAB — CUP PACEART REMOTE DEVICE CHECK
Date Time Interrogation Session: 20190612033947
Implantable Pulse Generator Implant Date: 20180604

## 2017-11-18 ENCOUNTER — Ambulatory Visit: Payer: Self-pay | Admitting: Adult Health

## 2017-11-26 ENCOUNTER — Ambulatory Visit: Payer: Medicare HMO | Admitting: Nurse Practitioner

## 2017-12-16 ENCOUNTER — Ambulatory Visit (INDEPENDENT_AMBULATORY_CARE_PROVIDER_SITE_OTHER): Payer: Medicare HMO | Admitting: *Deleted

## 2017-12-16 DIAGNOSIS — I639 Cerebral infarction, unspecified: Secondary | ICD-10-CM

## 2017-12-17 NOTE — Progress Notes (Signed)
Carelink Summary Report / Loop Recorder 

## 2017-12-24 LAB — CUP PACEART REMOTE DEVICE CHECK
Date Time Interrogation Session: 20190715084105
Implantable Pulse Generator Implant Date: 20180604

## 2017-12-25 DIAGNOSIS — I69153 Hemiplegia and hemiparesis following nontraumatic intracerebral hemorrhage affecting right non-dominant side: Secondary | ICD-10-CM | POA: Diagnosis not present

## 2017-12-25 DIAGNOSIS — M5412 Radiculopathy, cervical region: Secondary | ICD-10-CM | POA: Diagnosis not present

## 2017-12-25 DIAGNOSIS — I679 Cerebrovascular disease, unspecified: Secondary | ICD-10-CM | POA: Diagnosis not present

## 2017-12-25 DIAGNOSIS — M542 Cervicalgia: Secondary | ICD-10-CM | POA: Diagnosis not present

## 2017-12-25 DIAGNOSIS — F1120 Opioid dependence, uncomplicated: Secondary | ICD-10-CM | POA: Diagnosis not present

## 2017-12-25 DIAGNOSIS — G894 Chronic pain syndrome: Secondary | ICD-10-CM | POA: Diagnosis not present

## 2017-12-25 DIAGNOSIS — I10 Essential (primary) hypertension: Secondary | ICD-10-CM | POA: Diagnosis not present

## 2017-12-25 DIAGNOSIS — Z79899 Other long term (current) drug therapy: Secondary | ICD-10-CM | POA: Diagnosis not present

## 2018-01-03 DIAGNOSIS — I639 Cerebral infarction, unspecified: Secondary | ICD-10-CM | POA: Diagnosis not present

## 2018-01-03 DIAGNOSIS — R7301 Impaired fasting glucose: Secondary | ICD-10-CM | POA: Diagnosis not present

## 2018-01-03 DIAGNOSIS — Z1331 Encounter for screening for depression: Secondary | ICD-10-CM | POA: Diagnosis not present

## 2018-01-03 DIAGNOSIS — E785 Hyperlipidemia, unspecified: Secondary | ICD-10-CM | POA: Diagnosis not present

## 2018-01-03 DIAGNOSIS — I10 Essential (primary) hypertension: Secondary | ICD-10-CM | POA: Diagnosis not present

## 2018-01-03 DIAGNOSIS — Z6836 Body mass index (BMI) 36.0-36.9, adult: Secondary | ICD-10-CM | POA: Diagnosis not present

## 2018-01-03 DIAGNOSIS — Z1339 Encounter for screening examination for other mental health and behavioral disorders: Secondary | ICD-10-CM | POA: Diagnosis not present

## 2018-01-03 DIAGNOSIS — E559 Vitamin D deficiency, unspecified: Secondary | ICD-10-CM | POA: Diagnosis not present

## 2018-01-03 DIAGNOSIS — J449 Chronic obstructive pulmonary disease, unspecified: Secondary | ICD-10-CM | POA: Diagnosis not present

## 2018-01-06 NOTE — Progress Notes (Signed)
STROKE NEUROLOGY FOLLOW UP NOTE  NAME: Lynn Rollins DOB: May 06, 1965  REASON FOR VISIT: stroke follow up HISTORY FROM: pt and chart  Today we had the pleasure of seeing Lynn Rollins in follow-up at our Neurology Clinic. Pt was accompanied by mom.   History Summary [name] is a 52 y.o. female with history of strokes, hypertension, hyperlipidemia, morbid obesity, OSA on CPAP and fibromyalgia admitted on 08/08/17 for  sudden onset of left ear hearing loss, vertigo and double vision.   Hx of stroke  Stroke in 2011 - details not clear  09/2016 - right-sided weakness admitted for left ACA infarct.  MRI also showed old bilateral BG infarct, old left temporal and left cerebellum infarct.  MRA showed left A2 occlusion.  No DVT.  EF 60-65%.  Carotid Doppler negative.  Hypercoagulable workup negative.  TEE negative and loop recorder placed.  LDL 48 and A1c 5.7.  Discharged on dual antiplatelet.  On 08/08/2017 admission, she developed sudden onset left hearing loss, vertigo and dizziness with double vision.  Hearing loss and double vision much improved second day but still felt dizzy.  MRI showed left cerebellar peduncle abnormal T2 FLAIR signal.  MRA no LVO, chronic occluded left A2 and moderate bilateral M2 stenosis.  CTA had left A2 occlusion, bilateral M2 severe irregularity new since 08/2016.  CTA neck unremarkable.  EF 60 to 65%.  Loop recorder negative for A. fib.  LDL 52 and A1c 5.9.  Hypercoagulable and autoimmune work-up negative.  Her condition was entertained with vasculitis, such as Susac's disease. Not able to do LP due to on plavix at that time. Consider outpt LP, ENT and ophthalmology referral. On 08/13/17 she went back to ED due to vertigo, and MRI no acute changes. Referred to PT, treated with zofran and meclizine.   09/19/17 visit JX: During the interval time, the patient has been doing better. No more double vision or vertigo, however, left hearing loss minimal  improvement and continue to have mild dizziness on head movement or walking. She has to walk with walker. She also found by her PCP with left facial droop and left eye closing difficulty, considered bell's palsy and was treated with valacyclovir, but no steroids. Pt felt better since.  On today's exam, pt minimal left facial droop but still has left eye weak closing and decreased blinking, consistent with previous left peripheral VII palsy. With her left hearing loss and vertigo, lesion location consistent with left inferior cerebellar peduncle see on MRI T2 FLAIR abnormality.   Interval history 01/07/18: Patient is being seen today for scheduled follow-up visit and is accompanied by her husband.  She was seen by ENT on 11/07/2017 and was found to have sensorineural hearing loss of left ear and per notes, his hearing loss most likely will not resolve and recommended possible trial with amplification or bone-anchored hearing device and possible consideration for referral of vestibular evaluation. LP was performed on 09/27/2017 to rule out MS or vasculitis and was found to be negative.  She states overall she is been doing well without additional vertigo symptoms or double vision.  She does have occasional balance issues but does use a cane for assistance and denies any recent falls.  She feels as though her balance has also been improving and will have to lay down periodically but not as often.  She continues to have left sided hearing loss and feels as though it has improved some where she is hearing a "whooshing  sound" and "chirping noise".  She has not been back to ENT at this time as she was hopeful that there would be improvement prior to use of hearing aid device. She continues to take Plavix without side effects of bleeding or bruising.  Blood pressure at today's appointment elevated at 160/102 but patient states that it is elevated at appointments.  She was monitoring levels at home but not recently.  Loop  recorder has not shown atrial fibrillation thus far.  For the past 2 to 3 months, she has been experiencing a burning sensation and pain at her loop insertion site.  States that it typically occurs after sleep but she can also experience this burning sensation throughout the day.  Denies new or worsening stroke/TIA symptoms.      REVIEW OF SYSTEMS: Full 14 system review of systems performed and notable only for those listed below and in HPI above, all others are negative:  Hearing loss and balance issues  The following represents the patient's updated allergies and side effects list: Allergies  Allergen Reactions  . Celexa [Citalopram Hydrobromide] Other (See Comments)    "Made me cry and made me worse"  . Tape Rash and Other (See Comments)    Patient has Fibromyalgia and cannot tolerate medical tape and it's removal  . Statins Other (See Comments)    Muscles cramp    The neurologically relevant items on the patient's problem list were reviewed on today's visit.  Neurologic Examination  A problem focused neurological exam (12 or more points of the single system neurologic examination, vital signs counts as 1 point, cranial nerves count for 8 points) was performed.  Blood pressure (!) 160/102, height 4\' 10"  (1.473 m), weight 181 lb (82.1 kg).  General - obese, well developed, pleasant middle-aged Caucasian female, in no apparent distress.  Ophthalmologic - Fundi not visualized due to noncooperation.  Cardiovascular - Regular rate and rhythm.  Mental Status -  Level of arousal and orientation to time, place, and person were intact. Language including expression, naming, repetition, comprehension was assessed and found intact. Attention span and concentration were normal. Fund of Knowledge was assessed and was intact.  Cranial Nerves II - XII - II - Visual field intact OU. III, IV, VI - Extraocular movements intact. V - Facial sensation intact bilaterally. VII - Facial  movement intact bilaterally. VIII - left ear: bone conduction > air conduction, weber test - denies sound left ear; right ear normal X - Palate elevates symmetrically. XI - Chin turning & shoulder shrug intact bilaterally. XII - Tongue protrusion intact.  Motor Strength - The patient's strength was normal in all extremities and pronator drift was absent.  Bulk was normal and fasciculations were absent.   Motor Tone - Muscle tone was assessed at the neck and appendages and was normal.  Reflexes - The patient's reflexes were 1+ in all extremities and she had no pathological reflexes.  Sensory - Light touch, temperature/pinprick were assessed and were normal.    Coordination - The patient had normal movements in the hands and feet with no ataxia or dysmetria.  Tremor was absent.  Gait and Station - walk with cane and steady no ataxic gait   Imaging/labs:  DG Lumbar Puncture  09/27/17 Negative for MS or vasculitis    Assessment: Vona Whiters is a 52 year old female with left cerebellar peduncle infarct on 08/2016.  Vascular risk factors include history of stroke, HTN, HLD, morbid obesity, and OSA on CPAP.  Most likely etiology for  stroke small vessel versus large vessel arthrosclerosis but due to continued hearing loss and vertigo, it was recommended to rule out vasculitis or MS and Dr. Erlinda Hong recommended LP and ENT appointment.  Patient is being seen today for scheduled follow-up appointment and does continue to have left-sided hearing loss and balance difficulties with reported mild improvement but denies vertigo or double vision.    Plan:  - continue plavix for stroke prevention - f/u with ENT as scheduled -discussed recommendations of hearing devices and patient was provided information from [employer] -patient plans to call ENT when she feels as though she no longer sees improvement in hearing -f/u with PCP for management of HTN, HLD and OSA -Recommended continued compliance of CPAP for  OSA management -Recommend restarting blood pressure monitoring at home with diary as BP's are elevated at appointments -Recommend maintain blood pressure goal <130/80, diabetes with hemoglobin A1c goal below 7.0% and lipids with LDL cholesterol goal below 70 mg/dL.   Follow-up in 6 months or call earlier if needed  A total of 45 minutes was spent face-to-face with this patient. Over half this time was spent on counseling patient on the vertigo, hearing loss, and double vision diagnosis and different diagnostic and therapeutic options available.   Venancio Poisson, AGNP-BC  [facility] [address] [place], [place] [address]  Phone [phone] Fax [phone] Note: This document was prepared with digital dictation and possible smart phrase technology. Any transcriptional errors that result from this process are unintentional.

## 2018-01-07 ENCOUNTER — Encounter: Payer: Self-pay | Admitting: Adult Health

## 2018-01-07 ENCOUNTER — Ambulatory Visit: Payer: Medicare HMO | Admitting: Adult Health

## 2018-01-07 VITALS — BP 160/102 | Ht <= 58 in | Wt 181.0 lb

## 2018-01-07 DIAGNOSIS — H9192 Unspecified hearing loss, left ear: Secondary | ICD-10-CM

## 2018-01-07 DIAGNOSIS — I10 Essential (primary) hypertension: Secondary | ICD-10-CM | POA: Diagnosis not present

## 2018-01-07 DIAGNOSIS — I63522 Cerebral infarction due to unspecified occlusion or stenosis of left anterior cerebral artery: Secondary | ICD-10-CM | POA: Diagnosis not present

## 2018-01-07 NOTE — Patient Instructions (Addendum)
Continue clopidogrel 75 mg daily for secondary stroke prevention  Continue to follow up with PCP regarding cholesterol and blood pressure management   Continue to follow up with ENT as scheduled  Continue to stay active and maintain a healthy diet  Continue to monitor blood pressure at home and write down numbers   Maintain strict control of hypertension with blood pressure goal below 130/90, diabetes with hemoglobin A1c goal below 6.5% and cholesterol with LDL cholesterol (bad cholesterol) goal below 70 mg/dL. I also advised the patient to eat a healthy diet with plenty of whole grains, cereals, fruits and vegetables, exercise regularly and maintain ideal body weight.  Followup in the future with me in 6 months or call earlier if needed       Thank you for coming to see [place] at [facility]. I hope we have been able to provide you high quality care today.  You may receive a patient satisfaction survey over the next few weeks. We would appreciate your feedback and comments so that we may continue to improve ourselves and the health of our patients.   OTHER AMPLIFICATION DEVICES Apart from hearing aids, other devices are available to amplify sounds for patients with hearing loss. These include amplified telephones and portable amplification systems. These latter use infrared technology to send sound to a headset from an external source, such as the audio output from a television set or the sound system in a public theater or church [8]. Personal sound amplification products (PSAPs) are relatively low-cost devices that can be purchased directly by the consumer which amplify sound. These products have improved over time but do not have all the capabilities of hearing aids or easy programing for more complex hearing loss patterns. However, several studies have shown that some of these devices perform comparably with traditional hearing aids in certain settings [02,54]. The  [employer] would argue that low-cost PSAPs would work better if a patient contracted with an audiologist to ensure it was properly programmed for that patient. The 2017 Over-the-Counter (OTC) Hearing Aid Act directed the [place] Food and Drug Administration (FDA) to establish a set of criteria and regulate a new category of OTC hearing aids. They must still meet the same high standards other medical devices meet, such as safety, labeling, and manufacturing protection. These devices are not yet available; the FDA has three years to establish the regulations that will govern OTC hearing aids.   BONE CONDUCTION HEARING DEVICES Certain patients who are unable to benefit from standard air conduction hearing devices (a conventional hearing aid) may benefit from a device that transmits sound directly through the skull. Bone conduction hearing aids can be held against the skull with a steel-spring headband; however, this is typically painful, cumbersome, produces skull deformities, and does not achieve good-quality hearing. By contrast, an implantable bone conduction hearing aid has significant advantages. The main implantable system available is a bone-anchored prosthetic device known as Baha. A small titanium implant is inserted into the skull where it osteointegrates. An abutment is attached to the implant such that a small portion of the abutment sticks out through the skin and forms a snap attachment point for a removable bone conduction hearing aid. Alternatively, a magnetic abutment can be attached, which is under the skin. The sound processor connects to this abutment with another magnet on the outside of the skin, both of which are removed when not in use. The sound quality is far superior to that of traditional bone conduction hearing aids,  and there is no pain or discomfort. Potential indications for such an implantable system include:  Congenital atresia of the ear canal such that it does not exist or cannot  accommodate a standard hearing aid Chronic infection of the middle or outer ear that is exacerbated by a standard hearing aid Allergic reactions to standard hearing aids Single-sided deafness as may occur after removal of an acoustic neuroma, from trauma, or from a viral or vascular insult  A case series report in patients with single-sided deafness demonstrated improved speech discrimination with a bone-anchored hearing aid [16,17].

## 2018-01-12 NOTE — Progress Notes (Signed)
I agree with the above plan 

## 2018-01-16 ENCOUNTER — Ambulatory Visit (INDEPENDENT_AMBULATORY_CARE_PROVIDER_SITE_OTHER): Payer: Medicare HMO | Admitting: *Deleted

## 2018-01-16 DIAGNOSIS — I639 Cerebral infarction, unspecified: Secondary | ICD-10-CM | POA: Diagnosis not present

## 2018-01-16 NOTE — Progress Notes (Signed)
Carelink Summary Report / Loop Recorder 

## 2018-01-20 LAB — CUP PACEART REMOTE DEVICE CHECK
Date Time Interrogation Session: 20190817083643
Implantable Pulse Generator Implant Date: 20180604

## 2018-01-27 LAB — CUP PACEART REMOTE DEVICE CHECK
Date Time Interrogation Session: 20190919124103
MDC IDC PG IMPLANT DT: 20180604

## 2018-01-28 ENCOUNTER — Telehealth: Payer: Self-pay | Admitting: Cardiology

## 2018-01-28 NOTE — Telephone Encounter (Signed)
LMOVM requesting that pt send manual transmission b/c home monitor has not updated in at least 14 days.    

## 2018-02-12 DIAGNOSIS — G894 Chronic pain syndrome: Secondary | ICD-10-CM | POA: Diagnosis not present

## 2018-02-12 DIAGNOSIS — I10 Essential (primary) hypertension: Secondary | ICD-10-CM | POA: Diagnosis not present

## 2018-02-12 DIAGNOSIS — M542 Cervicalgia: Secondary | ICD-10-CM | POA: Diagnosis not present

## 2018-02-12 DIAGNOSIS — F1120 Opioid dependence, uncomplicated: Secondary | ICD-10-CM | POA: Diagnosis not present

## 2018-02-12 DIAGNOSIS — Z79899 Other long term (current) drug therapy: Secondary | ICD-10-CM | POA: Diagnosis not present

## 2018-02-12 DIAGNOSIS — I69153 Hemiplegia and hemiparesis following nontraumatic intracerebral hemorrhage affecting right non-dominant side: Secondary | ICD-10-CM | POA: Diagnosis not present

## 2018-02-12 DIAGNOSIS — I679 Cerebrovascular disease, unspecified: Secondary | ICD-10-CM | POA: Diagnosis not present

## 2018-02-12 DIAGNOSIS — M5412 Radiculopathy, cervical region: Secondary | ICD-10-CM | POA: Diagnosis not present

## 2018-02-18 ENCOUNTER — Ambulatory Visit (INDEPENDENT_AMBULATORY_CARE_PROVIDER_SITE_OTHER): Payer: Medicare HMO | Admitting: *Deleted

## 2018-02-18 DIAGNOSIS — I639 Cerebral infarction, unspecified: Secondary | ICD-10-CM | POA: Diagnosis not present

## 2018-02-18 NOTE — Progress Notes (Signed)
Carelink Summary Report / Loop Recorder 

## 2018-02-28 ENCOUNTER — Encounter: Payer: Self-pay | Admitting: Gastroenterology

## 2018-03-05 DIAGNOSIS — G894 Chronic pain syndrome: Secondary | ICD-10-CM | POA: Diagnosis not present

## 2018-03-05 DIAGNOSIS — I69153 Hemiplegia and hemiparesis following nontraumatic intracerebral hemorrhage affecting right non-dominant side: Secondary | ICD-10-CM | POA: Diagnosis not present

## 2018-03-05 DIAGNOSIS — M5412 Radiculopathy, cervical region: Secondary | ICD-10-CM | POA: Diagnosis not present

## 2018-03-05 DIAGNOSIS — F1120 Opioid dependence, uncomplicated: Secondary | ICD-10-CM | POA: Diagnosis not present

## 2018-03-05 DIAGNOSIS — Z79899 Other long term (current) drug therapy: Secondary | ICD-10-CM | POA: Diagnosis not present

## 2018-03-05 DIAGNOSIS — I10 Essential (primary) hypertension: Secondary | ICD-10-CM | POA: Diagnosis not present

## 2018-03-05 DIAGNOSIS — I679 Cerebrovascular disease, unspecified: Secondary | ICD-10-CM | POA: Diagnosis not present

## 2018-03-05 DIAGNOSIS — M542 Cervicalgia: Secondary | ICD-10-CM | POA: Diagnosis not present

## 2018-03-13 LAB — CUP PACEART REMOTE DEVICE CHECK
MDC IDC PG IMPLANT DT: 20180604
MDC IDC SESS DTM: 20191022133805

## 2018-03-22 DIAGNOSIS — N3090 Cystitis, unspecified without hematuria: Secondary | ICD-10-CM | POA: Diagnosis not present

## 2018-03-22 DIAGNOSIS — N3001 Acute cystitis with hematuria: Secondary | ICD-10-CM | POA: Diagnosis not present

## 2018-03-22 DIAGNOSIS — K5900 Constipation, unspecified: Secondary | ICD-10-CM | POA: Diagnosis not present

## 2018-03-24 ENCOUNTER — Ambulatory Visit (INDEPENDENT_AMBULATORY_CARE_PROVIDER_SITE_OTHER): Payer: Medicare HMO

## 2018-03-24 DIAGNOSIS — I639 Cerebral infarction, unspecified: Secondary | ICD-10-CM | POA: Diagnosis not present

## 2018-03-24 NOTE — Progress Notes (Signed)
Carelink Summary Report / Loop Recorder 

## 2018-04-02 DIAGNOSIS — M5412 Radiculopathy, cervical region: Secondary | ICD-10-CM | POA: Diagnosis not present

## 2018-04-02 DIAGNOSIS — G894 Chronic pain syndrome: Secondary | ICD-10-CM | POA: Diagnosis not present

## 2018-04-02 DIAGNOSIS — I679 Cerebrovascular disease, unspecified: Secondary | ICD-10-CM | POA: Diagnosis not present

## 2018-04-02 DIAGNOSIS — Z79899 Other long term (current) drug therapy: Secondary | ICD-10-CM | POA: Diagnosis not present

## 2018-04-02 DIAGNOSIS — I69153 Hemiplegia and hemiparesis following nontraumatic intracerebral hemorrhage affecting right non-dominant side: Secondary | ICD-10-CM | POA: Diagnosis not present

## 2018-04-02 DIAGNOSIS — M542 Cervicalgia: Secondary | ICD-10-CM | POA: Diagnosis not present

## 2018-04-02 DIAGNOSIS — Z5181 Encounter for therapeutic drug level monitoring: Secondary | ICD-10-CM | POA: Diagnosis not present

## 2018-04-02 DIAGNOSIS — I10 Essential (primary) hypertension: Secondary | ICD-10-CM | POA: Diagnosis not present

## 2018-04-02 DIAGNOSIS — F1120 Opioid dependence, uncomplicated: Secondary | ICD-10-CM | POA: Diagnosis not present

## 2018-04-07 DIAGNOSIS — E785 Hyperlipidemia, unspecified: Secondary | ICD-10-CM | POA: Diagnosis not present

## 2018-04-07 DIAGNOSIS — Z1211 Encounter for screening for malignant neoplasm of colon: Secondary | ICD-10-CM | POA: Diagnosis not present

## 2018-04-07 DIAGNOSIS — J449 Chronic obstructive pulmonary disease, unspecified: Secondary | ICD-10-CM | POA: Diagnosis not present

## 2018-04-07 DIAGNOSIS — E559 Vitamin D deficiency, unspecified: Secondary | ICD-10-CM | POA: Diagnosis not present

## 2018-04-07 DIAGNOSIS — R7301 Impaired fasting glucose: Secondary | ICD-10-CM | POA: Diagnosis not present

## 2018-04-07 DIAGNOSIS — I63522 Cerebral infarction due to unspecified occlusion or stenosis of left anterior cerebral artery: Secondary | ICD-10-CM | POA: Diagnosis not present

## 2018-04-07 DIAGNOSIS — Z1231 Encounter for screening mammogram for malignant neoplasm of breast: Secondary | ICD-10-CM | POA: Diagnosis not present

## 2018-04-07 DIAGNOSIS — Z23 Encounter for immunization: Secondary | ICD-10-CM | POA: Diagnosis not present

## 2018-04-07 DIAGNOSIS — I10 Essential (primary) hypertension: Secondary | ICD-10-CM | POA: Diagnosis not present

## 2018-04-16 DIAGNOSIS — F1120 Opioid dependence, uncomplicated: Secondary | ICD-10-CM | POA: Diagnosis not present

## 2018-04-16 DIAGNOSIS — I69153 Hemiplegia and hemiparesis following nontraumatic intracerebral hemorrhage affecting right non-dominant side: Secondary | ICD-10-CM | POA: Diagnosis not present

## 2018-04-16 DIAGNOSIS — M542 Cervicalgia: Secondary | ICD-10-CM | POA: Diagnosis not present

## 2018-04-16 DIAGNOSIS — I679 Cerebrovascular disease, unspecified: Secondary | ICD-10-CM | POA: Diagnosis not present

## 2018-04-16 DIAGNOSIS — I10 Essential (primary) hypertension: Secondary | ICD-10-CM | POA: Diagnosis not present

## 2018-04-16 DIAGNOSIS — G894 Chronic pain syndrome: Secondary | ICD-10-CM | POA: Diagnosis not present

## 2018-04-16 DIAGNOSIS — Z79899 Other long term (current) drug therapy: Secondary | ICD-10-CM | POA: Diagnosis not present

## 2018-04-16 DIAGNOSIS — M5412 Radiculopathy, cervical region: Secondary | ICD-10-CM | POA: Diagnosis not present

## 2018-04-21 IMAGING — CT CT ANGIO NECK
1 of 12 series · 4 of 35 positions shown · IV contrast (OMNI 350)
Comparison: Brain MRI and MRA 08/08/2017, and earlier.

CLINICAL DATA: 51-year-old female with right side weakness found to
have subacute left basal ganglia infarct. Chronic left ACA A2
occlusion. Moderate bilateral M2 MCA stenosis suspected on MRI
yesterday.

EXAM:
CT ANGIOGRAPHY HEAD AND NECK
TECHNIQUE: Multidetector CT imaging of the head and neck was performed using
the standard protocol during bolus administration of intravenous
contrast. Multiplanar CT image reconstructions and MIPs were
obtained to evaluate the vascular anatomy. Carotid stenosis
measurements (when applicable) are obtained utilizing NASCET
criteria, using the distal internal carotid diameter as the
denominator.
CONTRAST:  50mL 69WTIV-ZGV IOPAMIDOL (69WTIV-ZGV) INJECTION 76%

[Series 11: cta neck axial · axial · 0.44mm/px · z∈[-270,-84]mm · 4 of 311 slices shown]
[im 63/311  soft-tissue]
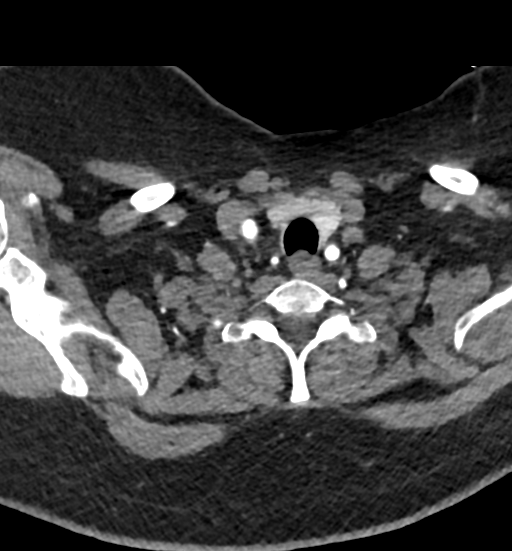
[im 125/311  bone]
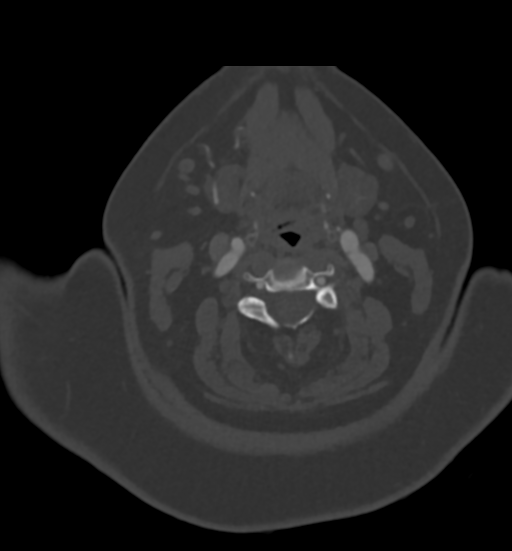
[im 187/311  soft-tissue]
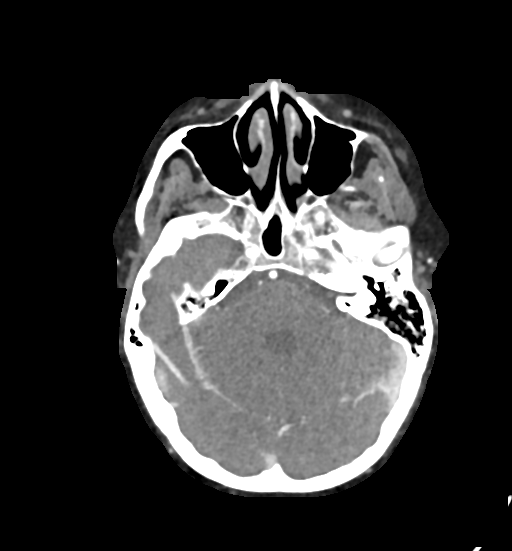
[im 249/311  bone]
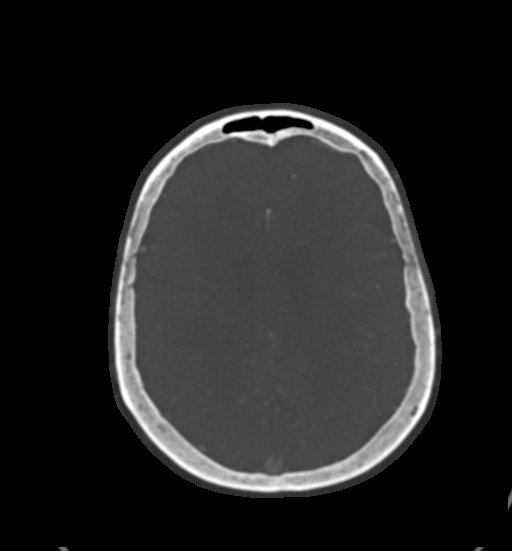

[4 of 35 positions shown; findings below may reference images not displayed]

FINDINGS: CT HEAD

Brain: Hypodensity in the left corona radiata corresponding to the
MRI described subacute lesion yesterday. More subtle chronic
hypodensity elsewhere related to prior ischemia in the left ACA
territory, bilateral basal ganglia, lateral left occipital lobe,
left cerebellar peduncle.

No midline shift, ventriculomegaly, mass effect, evidence of mass
lesion, intracranial hemorrhage or evidence of cortically based
acute infarction.

Calvarium and skull base: Negative.

Paranasal sinuses: Clear.

Orbits: No acute orbit or scalp soft tissue findings.

CTA NECK

Skeleton: Absent maxillary and posterior mandible dentition. No
acute osseous abnormality identified. Cervical spine disc and
endplate degeneration.

Upper chest: Negative lung apices. Superior mediastinal lipomatosis
without lymphadenopathy.

Other neck: Subcentimeter hypodense left thyroid nodule does not
meet consensus criteria for ultrasound follow-up. Otherwise
negative. No cervical lymphadenopathy.

Aortic arch: 3 vessel arch configuration with no arch
atherosclerosis.

Right carotid system: Negative brachiocephalic artery and right CCA
origin. Mild right CCA tortuosity. There is confluent calcified
plaque at the posterior right ICA origin and bulge, but no stenosis
results. Mild cervical right ICA tortuosity.

Left carotid system: Normal left CCA origin. Mild left CCA
tortuosity. Normal left carotid bifurcation. Tortuous cervical left
ICA without stenosis.

Vertebral arteries:
Minimal proximal right subclavian artery soft plaque without
stenosis. Normal right vertebral artery origin. Mild right vertebral
artery tortuosity with no stenosis to the skull base.

Tortuous proximal left subclavian artery without plaque or stenosis.
Normal left vertebral artery origin. Patent left vertebral artery to
the skull base without stenosis.

CTA HEAD

Posterior circulation: Patent distal vertebral arteries without
stenosis. The left is mildly dominant. There is minimal calcified
plaque in the left V4 segment. Normal right PICA origin. As before,
the left PICA is diminutive or absent and the left AICA is
diminutive. The right AICA is normal. Patent vertebrobasilar
junction and basilar artery without stenosis. Patent AICA and SCA
origins. Normal left PCA origin. Fetal type right PCA origin.
Diminutive or absent left posterior communicating artery. The
proximal PCAs are mildly irregular. The distal PCA branches are
mildly irregular.

Anterior circulation: Patent ICA siphons with no atherosclerosis or
stenosis. Normal ophthalmic and right posterior communicating artery
origins. Patent carotid termini.

Normal MCA and ACA origins. Codominant A1 segments. Normal anterior
communicating artery. Chronic occlusion of the proximal left ACA A2
is unchanged since 1210 (series 16, image 25). The right ACA and its
branches are within normal limits.

The left MCA M1 segment and bifurcation appear within normal limits.
There is moderate to severe irregularity and stenosis of a left MCA
is new since 1210 and stable to the MRA yesterday. The other MCA
branches scratched at the other left MCA branches are within normal
limits. The right MCA M1 segment and bifurcation appear normal.
There is a similar segment of moderate irregularity and stenosis in
a posterior right MCA M2 branch seen on series 16, image 52 which is
more apparent than on the MRA yesterday. This also appears new since
1210. There is otherwise mild right MCA branch irregularity.

Venous sinuses: Patent.

Anatomic variants: None.

Delayed phase: No abnormal enhancement identified.

Review of the MIP images confirms the above findings
IMPRESSION: 1. Minimal atherosclerosis from the arch to the skull base - limited
to the proximal right ICA with no stenosis. Otherwise normal
appearance of the great vessel origins, and the carotid and
vertebral arteries.
2. However, intracranially there is chronic left ACA A2 occlusion.
And bilateral MCA M2 branch segments of moderate to severe
irregularity (greater on the left) which has developed since a August 2016 MRA (see series 16, images 32 and 15).
3. The main differential considerations include disproportionate
intracranial atherosclerosis, sequelae of intracranial
thromboemboli, and intracranial vasculitis.
4. No acute intracranial abnormality. Stable CT appearance of the
brain since the MRI yesterday.

## 2018-04-25 ENCOUNTER — Ambulatory Visit (INDEPENDENT_AMBULATORY_CARE_PROVIDER_SITE_OTHER): Payer: Medicare HMO

## 2018-04-25 DIAGNOSIS — I639 Cerebral infarction, unspecified: Secondary | ICD-10-CM | POA: Diagnosis not present

## 2018-04-27 LAB — CUP PACEART REMOTE DEVICE CHECK
MDC IDC PG IMPLANT DT: 20180604
MDC IDC SESS DTM: 20191227150712

## 2018-04-28 NOTE — Progress Notes (Signed)
Carelink Summary Report / Loop Recorder 

## 2018-05-05 DIAGNOSIS — J069 Acute upper respiratory infection, unspecified: Secondary | ICD-10-CM | POA: Diagnosis not present

## 2018-05-11 LAB — CUP PACEART REMOTE DEVICE CHECK
Implantable Pulse Generator Implant Date: 20180604
MDC IDC SESS DTM: 20191124144008

## 2018-05-28 ENCOUNTER — Ambulatory Visit (INDEPENDENT_AMBULATORY_CARE_PROVIDER_SITE_OTHER): Payer: Medicare HMO

## 2018-05-28 DIAGNOSIS — I639 Cerebral infarction, unspecified: Secondary | ICD-10-CM

## 2018-05-29 DIAGNOSIS — Z8673 Personal history of transient ischemic attack (TIA), and cerebral infarction without residual deficits: Secondary | ICD-10-CM | POA: Diagnosis not present

## 2018-05-29 DIAGNOSIS — G4733 Obstructive sleep apnea (adult) (pediatric): Secondary | ICD-10-CM | POA: Diagnosis not present

## 2018-05-29 DIAGNOSIS — M797 Fibromyalgia: Secondary | ICD-10-CM | POA: Diagnosis not present

## 2018-05-29 DIAGNOSIS — H9312 Tinnitus, left ear: Secondary | ICD-10-CM | POA: Diagnosis not present

## 2018-05-29 DIAGNOSIS — H9042 Sensorineural hearing loss, unilateral, left ear, with unrestricted hearing on the contralateral side: Secondary | ICD-10-CM | POA: Diagnosis not present

## 2018-05-29 NOTE — Progress Notes (Signed)
Carelink Summary Report / Loop Recorder 

## 2018-05-30 LAB — CUP PACEART REMOTE DEVICE CHECK
Date Time Interrogation Session: 20200129153628
MDC IDC PG IMPLANT DT: 20180604

## 2018-06-09 IMAGING — XA DG FLUORO GUIDE LUMBAR PUNCTURE
2 series · 2 of 2 positions shown · non-contrast
Comparison: none

CLINICAL DATA: Possible multiple sclerosis.

[Series 1: ortho standard · 1 of 1 slices shown (1 of 2)]
[im 1/1]
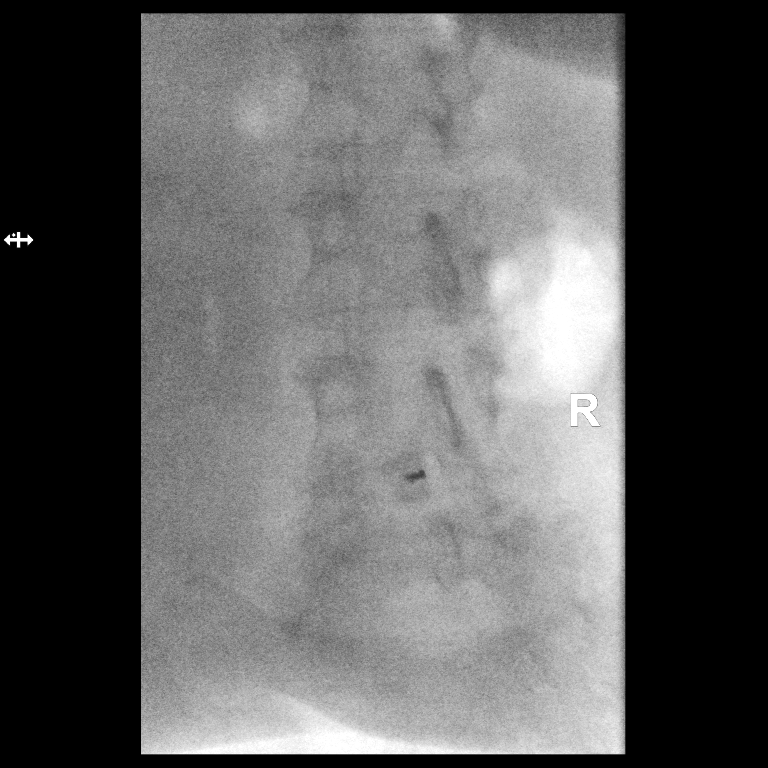

[Series 2: ortho standard · 1 of 1 slices shown (2 of 2)]
[im 1/1]
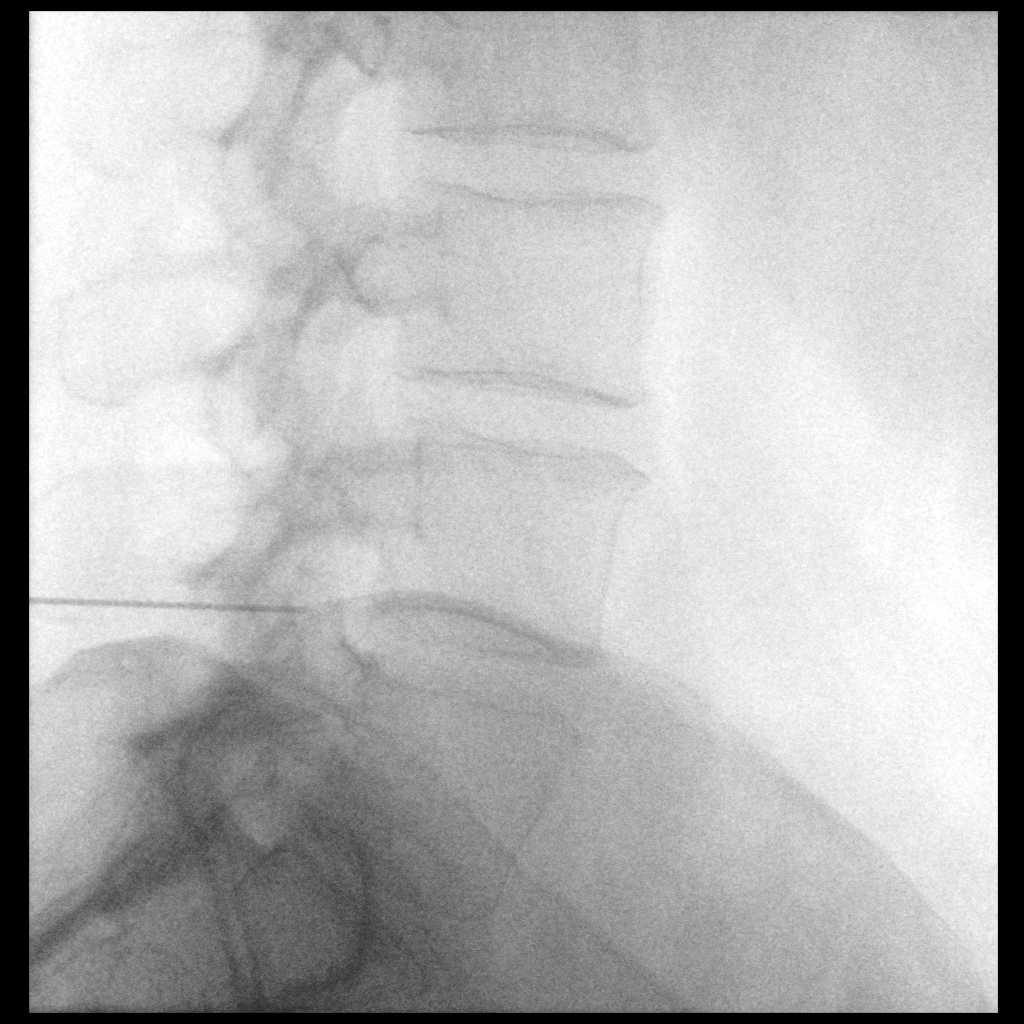

[2 of 2 positions shown; findings below may reference images not displayed]

EXAM:
DIAGNOSTIC LUMBAR PUNCTURE UNDER FLUOROSCOPIC GUIDANCE

FLUOROSCOPY TIME:  17 seconds corresponding to a Dose Area Product
of 67.9 Gy*m2

PROCEDURE:
Informed consent was obtained from the patient prior to the
procedure, including potential complications of headache, allergy,
and pain. Time-out was performed.

The patient stopped taking Plavix 1 week ago. Patient was instructed
to resume Plavix this evening.

With the patient lateral decubitus, the lower back was prepped with
Betadine. 1% Lidocaine was used for local anesthesia. Lumbar
puncture was performed at the L4-5 level using a 20 gauge needle
with return of clear CSF with an opening pressure of 17 cm water.
12.0 ml of CSF were obtained for laboratory studies. The patient
tolerated the procedure well and there were no apparent
complications.
IMPRESSION: Technically successful diagnostic lumbar puncture at L4-5. No
complications. Clear colorless fluid.

## 2018-06-11 ENCOUNTER — Encounter (INDEPENDENT_AMBULATORY_CARE_PROVIDER_SITE_OTHER): Payer: Self-pay

## 2018-06-11 DIAGNOSIS — F1120 Opioid dependence, uncomplicated: Secondary | ICD-10-CM | POA: Diagnosis not present

## 2018-06-11 DIAGNOSIS — M5412 Radiculopathy, cervical region: Secondary | ICD-10-CM | POA: Diagnosis not present

## 2018-06-11 DIAGNOSIS — I679 Cerebrovascular disease, unspecified: Secondary | ICD-10-CM | POA: Diagnosis not present

## 2018-06-11 DIAGNOSIS — Z79899 Other long term (current) drug therapy: Secondary | ICD-10-CM | POA: Diagnosis not present

## 2018-06-11 DIAGNOSIS — G894 Chronic pain syndrome: Secondary | ICD-10-CM | POA: Diagnosis not present

## 2018-06-11 DIAGNOSIS — I10 Essential (primary) hypertension: Secondary | ICD-10-CM | POA: Diagnosis not present

## 2018-06-11 DIAGNOSIS — M542 Cervicalgia: Secondary | ICD-10-CM | POA: Diagnosis not present

## 2018-06-11 DIAGNOSIS — I69153 Hemiplegia and hemiparesis following nontraumatic intracerebral hemorrhage affecting right non-dominant side: Secondary | ICD-10-CM | POA: Diagnosis not present

## 2018-06-13 DIAGNOSIS — R269 Unspecified abnormalities of gait and mobility: Secondary | ICD-10-CM | POA: Diagnosis not present

## 2018-06-13 DIAGNOSIS — G4733 Obstructive sleep apnea (adult) (pediatric): Secondary | ICD-10-CM | POA: Diagnosis not present

## 2018-06-30 ENCOUNTER — Ambulatory Visit (INDEPENDENT_AMBULATORY_CARE_PROVIDER_SITE_OTHER): Payer: Medicare HMO | Admitting: *Deleted

## 2018-06-30 DIAGNOSIS — I639 Cerebral infarction, unspecified: Secondary | ICD-10-CM

## 2018-06-30 LAB — CUP PACEART REMOTE DEVICE CHECK
Date Time Interrogation Session: 20200302144113
Implantable Pulse Generator Implant Date: 20180604

## 2018-07-07 NOTE — Progress Notes (Signed)
Carelink Summary Report / Loop Recorder 

## 2018-07-08 ENCOUNTER — Ambulatory Visit: Payer: Medicare HMO | Admitting: Adult Health

## 2018-07-08 DIAGNOSIS — I10 Essential (primary) hypertension: Secondary | ICD-10-CM | POA: Diagnosis not present

## 2018-07-08 DIAGNOSIS — R7301 Impaired fasting glucose: Secondary | ICD-10-CM | POA: Diagnosis not present

## 2018-07-08 DIAGNOSIS — E559 Vitamin D deficiency, unspecified: Secondary | ICD-10-CM | POA: Diagnosis not present

## 2018-07-08 DIAGNOSIS — I6990 Unspecified sequelae of unspecified cerebrovascular disease: Secondary | ICD-10-CM | POA: Diagnosis not present

## 2018-07-08 DIAGNOSIS — E785 Hyperlipidemia, unspecified: Secondary | ICD-10-CM | POA: Diagnosis not present

## 2018-07-08 DIAGNOSIS — R399 Unspecified symptoms and signs involving the genitourinary system: Secondary | ICD-10-CM | POA: Diagnosis not present

## 2018-07-08 DIAGNOSIS — J449 Chronic obstructive pulmonary disease, unspecified: Secondary | ICD-10-CM | POA: Diagnosis not present

## 2018-07-09 DIAGNOSIS — I679 Cerebrovascular disease, unspecified: Secondary | ICD-10-CM | POA: Diagnosis not present

## 2018-07-09 DIAGNOSIS — G894 Chronic pain syndrome: Secondary | ICD-10-CM | POA: Diagnosis not present

## 2018-07-09 DIAGNOSIS — M542 Cervicalgia: Secondary | ICD-10-CM | POA: Diagnosis not present

## 2018-07-09 DIAGNOSIS — F1120 Opioid dependence, uncomplicated: Secondary | ICD-10-CM | POA: Diagnosis not present

## 2018-07-09 DIAGNOSIS — I69153 Hemiplegia and hemiparesis following nontraumatic intracerebral hemorrhage affecting right non-dominant side: Secondary | ICD-10-CM | POA: Diagnosis not present

## 2018-07-09 DIAGNOSIS — I10 Essential (primary) hypertension: Secondary | ICD-10-CM | POA: Diagnosis not present

## 2018-07-09 DIAGNOSIS — Z79899 Other long term (current) drug therapy: Secondary | ICD-10-CM | POA: Diagnosis not present

## 2018-07-09 DIAGNOSIS — M5412 Radiculopathy, cervical region: Secondary | ICD-10-CM | POA: Diagnosis not present

## 2018-07-18 ENCOUNTER — Encounter: Payer: Self-pay | Admitting: Gastroenterology

## 2018-08-04 ENCOUNTER — Ambulatory Visit (INDEPENDENT_AMBULATORY_CARE_PROVIDER_SITE_OTHER): Payer: Medicare HMO | Admitting: *Deleted

## 2018-08-04 ENCOUNTER — Other Ambulatory Visit: Payer: Self-pay

## 2018-08-04 DIAGNOSIS — I639 Cerebral infarction, unspecified: Secondary | ICD-10-CM | POA: Diagnosis not present

## 2018-08-04 LAB — CUP PACEART REMOTE DEVICE CHECK
Date Time Interrogation Session: 20200404173936
Implantable Pulse Generator Implant Date: 20180604

## 2018-08-11 DIAGNOSIS — G4733 Obstructive sleep apnea (adult) (pediatric): Secondary | ICD-10-CM | POA: Diagnosis not present

## 2018-08-11 DIAGNOSIS — R5383 Other fatigue: Secondary | ICD-10-CM | POA: Diagnosis not present

## 2018-08-11 DIAGNOSIS — J4530 Mild persistent asthma, uncomplicated: Secondary | ICD-10-CM | POA: Diagnosis not present

## 2018-08-12 NOTE — Progress Notes (Signed)
Carelink Summary Report / Loop Recorder 

## 2018-08-27 DIAGNOSIS — G894 Chronic pain syndrome: Secondary | ICD-10-CM | POA: Diagnosis not present

## 2018-08-27 DIAGNOSIS — M542 Cervicalgia: Secondary | ICD-10-CM | POA: Diagnosis not present

## 2018-08-27 DIAGNOSIS — Z79899 Other long term (current) drug therapy: Secondary | ICD-10-CM | POA: Diagnosis not present

## 2018-08-27 DIAGNOSIS — F1120 Opioid dependence, uncomplicated: Secondary | ICD-10-CM | POA: Diagnosis not present

## 2018-08-27 DIAGNOSIS — I679 Cerebrovascular disease, unspecified: Secondary | ICD-10-CM | POA: Diagnosis not present

## 2018-08-27 DIAGNOSIS — I69153 Hemiplegia and hemiparesis following nontraumatic intracerebral hemorrhage affecting right non-dominant side: Secondary | ICD-10-CM | POA: Diagnosis not present

## 2018-08-27 DIAGNOSIS — I10 Essential (primary) hypertension: Secondary | ICD-10-CM | POA: Diagnosis not present

## 2018-08-27 DIAGNOSIS — M5412 Radiculopathy, cervical region: Secondary | ICD-10-CM | POA: Diagnosis not present

## 2018-09-04 ENCOUNTER — Ambulatory Visit (INDEPENDENT_AMBULATORY_CARE_PROVIDER_SITE_OTHER): Payer: Medicare HMO | Admitting: *Deleted

## 2018-09-04 ENCOUNTER — Other Ambulatory Visit: Payer: Self-pay

## 2018-09-04 DIAGNOSIS — I639 Cerebral infarction, unspecified: Secondary | ICD-10-CM

## 2018-09-04 DIAGNOSIS — G4733 Obstructive sleep apnea (adult) (pediatric): Secondary | ICD-10-CM | POA: Diagnosis not present

## 2018-09-05 LAB — CUP PACEART REMOTE DEVICE CHECK
Date Time Interrogation Session: 20200507174000
Implantable Pulse Generator Implant Date: 20180604

## 2018-09-09 NOTE — Progress Notes (Signed)
Carelink Summary Report / Loop Recorder 

## 2018-09-12 DIAGNOSIS — Z0100 Encounter for examination of eyes and vision without abnormal findings: Secondary | ICD-10-CM | POA: Diagnosis not present

## 2018-09-24 DIAGNOSIS — F1120 Opioid dependence, uncomplicated: Secondary | ICD-10-CM | POA: Diagnosis not present

## 2018-09-24 DIAGNOSIS — I10 Essential (primary) hypertension: Secondary | ICD-10-CM | POA: Diagnosis not present

## 2018-09-24 DIAGNOSIS — G894 Chronic pain syndrome: Secondary | ICD-10-CM | POA: Diagnosis not present

## 2018-09-24 DIAGNOSIS — M542 Cervicalgia: Secondary | ICD-10-CM | POA: Diagnosis not present

## 2018-09-24 DIAGNOSIS — M5412 Radiculopathy, cervical region: Secondary | ICD-10-CM | POA: Diagnosis not present

## 2018-09-24 DIAGNOSIS — I679 Cerebrovascular disease, unspecified: Secondary | ICD-10-CM | POA: Diagnosis not present

## 2018-09-24 DIAGNOSIS — I69153 Hemiplegia and hemiparesis following nontraumatic intracerebral hemorrhage affecting right non-dominant side: Secondary | ICD-10-CM | POA: Diagnosis not present

## 2018-10-07 ENCOUNTER — Ambulatory Visit (INDEPENDENT_AMBULATORY_CARE_PROVIDER_SITE_OTHER): Payer: Medicare HMO | Admitting: *Deleted

## 2018-10-07 DIAGNOSIS — I639 Cerebral infarction, unspecified: Secondary | ICD-10-CM

## 2018-10-07 LAB — CUP PACEART REMOTE DEVICE CHECK
Date Time Interrogation Session: 20200609163554
Implantable Pulse Generator Implant Date: 20180604

## 2018-10-10 ENCOUNTER — Encounter: Payer: Self-pay | Admitting: Internal Medicine

## 2018-10-15 NOTE — Progress Notes (Signed)
Carelink Summary Report / Loop Recorder 

## 2018-10-23 DIAGNOSIS — J449 Chronic obstructive pulmonary disease, unspecified: Secondary | ICD-10-CM | POA: Diagnosis not present

## 2018-10-23 DIAGNOSIS — Z1231 Encounter for screening mammogram for malignant neoplasm of breast: Secondary | ICD-10-CM | POA: Diagnosis not present

## 2018-10-23 DIAGNOSIS — E785 Hyperlipidemia, unspecified: Secondary | ICD-10-CM | POA: Diagnosis not present

## 2018-10-23 DIAGNOSIS — E669 Obesity, unspecified: Secondary | ICD-10-CM | POA: Diagnosis not present

## 2018-10-23 DIAGNOSIS — I6990 Unspecified sequelae of unspecified cerebrovascular disease: Secondary | ICD-10-CM | POA: Diagnosis not present

## 2018-10-23 DIAGNOSIS — E559 Vitamin D deficiency, unspecified: Secondary | ICD-10-CM | POA: Diagnosis not present

## 2018-10-23 DIAGNOSIS — I10 Essential (primary) hypertension: Secondary | ICD-10-CM | POA: Diagnosis not present

## 2018-10-23 DIAGNOSIS — R7301 Impaired fasting glucose: Secondary | ICD-10-CM | POA: Diagnosis not present

## 2018-11-04 DIAGNOSIS — R5383 Other fatigue: Secondary | ICD-10-CM | POA: Diagnosis not present

## 2018-11-04 DIAGNOSIS — J4530 Mild persistent asthma, uncomplicated: Secondary | ICD-10-CM | POA: Diagnosis not present

## 2018-11-04 DIAGNOSIS — G4733 Obstructive sleep apnea (adult) (pediatric): Secondary | ICD-10-CM | POA: Diagnosis not present

## 2018-11-05 DIAGNOSIS — I679 Cerebrovascular disease, unspecified: Secondary | ICD-10-CM | POA: Diagnosis not present

## 2018-11-05 DIAGNOSIS — G894 Chronic pain syndrome: Secondary | ICD-10-CM | POA: Diagnosis not present

## 2018-11-05 DIAGNOSIS — F1120 Opioid dependence, uncomplicated: Secondary | ICD-10-CM | POA: Diagnosis not present

## 2018-11-05 DIAGNOSIS — I10 Essential (primary) hypertension: Secondary | ICD-10-CM | POA: Diagnosis not present

## 2018-11-05 DIAGNOSIS — M542 Cervicalgia: Secondary | ICD-10-CM | POA: Diagnosis not present

## 2018-11-05 DIAGNOSIS — M5412 Radiculopathy, cervical region: Secondary | ICD-10-CM | POA: Diagnosis not present

## 2018-11-05 DIAGNOSIS — G8929 Other chronic pain: Secondary | ICD-10-CM | POA: Diagnosis not present

## 2018-11-05 DIAGNOSIS — I69153 Hemiplegia and hemiparesis following nontraumatic intracerebral hemorrhage affecting right non-dominant side: Secondary | ICD-10-CM | POA: Diagnosis not present

## 2018-11-05 DIAGNOSIS — Z79899 Other long term (current) drug therapy: Secondary | ICD-10-CM | POA: Diagnosis not present

## 2018-11-10 ENCOUNTER — Ambulatory Visit (INDEPENDENT_AMBULATORY_CARE_PROVIDER_SITE_OTHER): Payer: Medicare HMO | Admitting: *Deleted

## 2018-11-10 DIAGNOSIS — I63513 Cerebral infarction due to unspecified occlusion or stenosis of bilateral middle cerebral arteries: Secondary | ICD-10-CM | POA: Diagnosis not present

## 2018-11-10 LAB — CUP PACEART REMOTE DEVICE CHECK
Date Time Interrogation Session: 20200712181010
Implantable Pulse Generator Implant Date: 20180604

## 2018-11-20 NOTE — Progress Notes (Signed)
Carelink Summary Report / Loop Recorder 

## 2018-11-26 DIAGNOSIS — H9042 Sensorineural hearing loss, unilateral, left ear, with unrestricted hearing on the contralateral side: Secondary | ICD-10-CM | POA: Diagnosis not present

## 2018-11-26 DIAGNOSIS — Z20828 Contact with and (suspected) exposure to other viral communicable diseases: Secondary | ICD-10-CM | POA: Diagnosis not present

## 2018-11-26 DIAGNOSIS — Z01812 Encounter for preprocedural laboratory examination: Secondary | ICD-10-CM | POA: Diagnosis not present

## 2018-12-02 DIAGNOSIS — M797 Fibromyalgia: Secondary | ICD-10-CM | POA: Diagnosis not present

## 2018-12-02 DIAGNOSIS — F419 Anxiety disorder, unspecified: Secondary | ICD-10-CM | POA: Diagnosis not present

## 2018-12-02 DIAGNOSIS — K219 Gastro-esophageal reflux disease without esophagitis: Secondary | ICD-10-CM | POA: Diagnosis not present

## 2018-12-02 DIAGNOSIS — H9042 Sensorineural hearing loss, unilateral, left ear, with unrestricted hearing on the contralateral side: Secondary | ICD-10-CM | POA: Diagnosis not present

## 2018-12-02 DIAGNOSIS — G4733 Obstructive sleep apnea (adult) (pediatric): Secondary | ICD-10-CM | POA: Diagnosis not present

## 2018-12-02 DIAGNOSIS — Z95818 Presence of other cardiac implants and grafts: Secondary | ICD-10-CM | POA: Diagnosis not present

## 2018-12-02 DIAGNOSIS — J45909 Unspecified asthma, uncomplicated: Secondary | ICD-10-CM | POA: Diagnosis not present

## 2018-12-02 DIAGNOSIS — H9312 Tinnitus, left ear: Secondary | ICD-10-CM | POA: Diagnosis not present

## 2018-12-02 DIAGNOSIS — Z8673 Personal history of transient ischemic attack (TIA), and cerebral infarction without residual deficits: Secondary | ICD-10-CM | POA: Diagnosis not present

## 2018-12-09 DIAGNOSIS — M797 Fibromyalgia: Secondary | ICD-10-CM | POA: Diagnosis not present

## 2018-12-09 DIAGNOSIS — Z9629 Presence of other otological and audiological implants: Secondary | ICD-10-CM | POA: Diagnosis not present

## 2018-12-09 DIAGNOSIS — G4733 Obstructive sleep apnea (adult) (pediatric): Secondary | ICD-10-CM | POA: Diagnosis not present

## 2018-12-09 DIAGNOSIS — H9042 Sensorineural hearing loss, unilateral, left ear, with unrestricted hearing on the contralateral side: Secondary | ICD-10-CM | POA: Diagnosis not present

## 2018-12-09 DIAGNOSIS — Z8673 Personal history of transient ischemic attack (TIA), and cerebral infarction without residual deficits: Secondary | ICD-10-CM | POA: Diagnosis not present

## 2018-12-09 DIAGNOSIS — H9312 Tinnitus, left ear: Secondary | ICD-10-CM | POA: Diagnosis not present

## 2018-12-10 DIAGNOSIS — M542 Cervicalgia: Secondary | ICD-10-CM | POA: Diagnosis not present

## 2018-12-10 DIAGNOSIS — G8929 Other chronic pain: Secondary | ICD-10-CM | POA: Diagnosis not present

## 2018-12-10 DIAGNOSIS — M5412 Radiculopathy, cervical region: Secondary | ICD-10-CM | POA: Diagnosis not present

## 2018-12-10 DIAGNOSIS — G894 Chronic pain syndrome: Secondary | ICD-10-CM | POA: Diagnosis not present

## 2018-12-10 DIAGNOSIS — F1120 Opioid dependence, uncomplicated: Secondary | ICD-10-CM | POA: Diagnosis not present

## 2018-12-10 DIAGNOSIS — I69153 Hemiplegia and hemiparesis following nontraumatic intracerebral hemorrhage affecting right non-dominant side: Secondary | ICD-10-CM | POA: Diagnosis not present

## 2018-12-10 DIAGNOSIS — I10 Essential (primary) hypertension: Secondary | ICD-10-CM | POA: Diagnosis not present

## 2018-12-10 DIAGNOSIS — I679 Cerebrovascular disease, unspecified: Secondary | ICD-10-CM | POA: Diagnosis not present

## 2018-12-12 ENCOUNTER — Ambulatory Visit (INDEPENDENT_AMBULATORY_CARE_PROVIDER_SITE_OTHER): Payer: Medicare HMO | Admitting: *Deleted

## 2018-12-12 DIAGNOSIS — I63513 Cerebral infarction due to unspecified occlusion or stenosis of bilateral middle cerebral arteries: Secondary | ICD-10-CM | POA: Diagnosis not present

## 2018-12-12 LAB — CUP PACEART REMOTE DEVICE CHECK
Date Time Interrogation Session: 20200814173035
Implantable Pulse Generator Implant Date: 20180604

## 2018-12-19 ENCOUNTER — Telehealth: Payer: Self-pay

## 2018-12-19 NOTE — Telephone Encounter (Signed)
Left message for patient to regarding disconnected monitor 

## 2018-12-19 NOTE — Progress Notes (Signed)
Carelink Summary Report / Loop Recorder 

## 2018-12-31 ENCOUNTER — Ambulatory Visit: Payer: Medicare HMO | Admitting: Adult Health

## 2019-01-07 DIAGNOSIS — Z79899 Other long term (current) drug therapy: Secondary | ICD-10-CM | POA: Diagnosis not present

## 2019-01-07 DIAGNOSIS — I10 Essential (primary) hypertension: Secondary | ICD-10-CM | POA: Diagnosis not present

## 2019-01-07 DIAGNOSIS — M542 Cervicalgia: Secondary | ICD-10-CM | POA: Diagnosis not present

## 2019-01-07 DIAGNOSIS — F1120 Opioid dependence, uncomplicated: Secondary | ICD-10-CM | POA: Diagnosis not present

## 2019-01-07 DIAGNOSIS — G894 Chronic pain syndrome: Secondary | ICD-10-CM | POA: Diagnosis not present

## 2019-01-07 DIAGNOSIS — I69153 Hemiplegia and hemiparesis following nontraumatic intracerebral hemorrhage affecting right non-dominant side: Secondary | ICD-10-CM | POA: Diagnosis not present

## 2019-01-07 DIAGNOSIS — M5412 Radiculopathy, cervical region: Secondary | ICD-10-CM | POA: Diagnosis not present

## 2019-01-07 DIAGNOSIS — I679 Cerebrovascular disease, unspecified: Secondary | ICD-10-CM | POA: Diagnosis not present

## 2019-01-14 ENCOUNTER — Ambulatory Visit (INDEPENDENT_AMBULATORY_CARE_PROVIDER_SITE_OTHER): Payer: Medicare HMO | Admitting: *Deleted

## 2019-01-14 DIAGNOSIS — I63513 Cerebral infarction due to unspecified occlusion or stenosis of bilateral middle cerebral arteries: Secondary | ICD-10-CM

## 2019-01-15 DIAGNOSIS — Z48810 Encounter for surgical aftercare following surgery on the sense organs: Secondary | ICD-10-CM | POA: Diagnosis not present

## 2019-01-15 DIAGNOSIS — Z9621 Cochlear implant status: Secondary | ICD-10-CM | POA: Diagnosis not present

## 2019-01-15 LAB — CUP PACEART REMOTE DEVICE CHECK
Date Time Interrogation Session: 20200916183825
Implantable Pulse Generator Implant Date: 20180604

## 2019-01-20 NOTE — Progress Notes (Signed)
Carelink Summary Report / Loop Recorder 

## 2019-02-04 DIAGNOSIS — Z79899 Other long term (current) drug therapy: Secondary | ICD-10-CM | POA: Diagnosis not present

## 2019-02-04 DIAGNOSIS — I69153 Hemiplegia and hemiparesis following nontraumatic intracerebral hemorrhage affecting right non-dominant side: Secondary | ICD-10-CM | POA: Diagnosis not present

## 2019-02-04 DIAGNOSIS — I679 Cerebrovascular disease, unspecified: Secondary | ICD-10-CM | POA: Diagnosis not present

## 2019-02-04 DIAGNOSIS — I10 Essential (primary) hypertension: Secondary | ICD-10-CM | POA: Diagnosis not present

## 2019-02-04 DIAGNOSIS — M5412 Radiculopathy, cervical region: Secondary | ICD-10-CM | POA: Diagnosis not present

## 2019-02-04 DIAGNOSIS — M542 Cervicalgia: Secondary | ICD-10-CM | POA: Diagnosis not present

## 2019-02-04 DIAGNOSIS — G894 Chronic pain syndrome: Secondary | ICD-10-CM | POA: Diagnosis not present

## 2019-02-04 DIAGNOSIS — F1120 Opioid dependence, uncomplicated: Secondary | ICD-10-CM | POA: Diagnosis not present

## 2019-02-05 DIAGNOSIS — E559 Vitamin D deficiency, unspecified: Secondary | ICD-10-CM | POA: Diagnosis not present

## 2019-02-05 DIAGNOSIS — B351 Tinea unguium: Secondary | ICD-10-CM | POA: Diagnosis not present

## 2019-02-05 DIAGNOSIS — E785 Hyperlipidemia, unspecified: Secondary | ICD-10-CM | POA: Diagnosis not present

## 2019-02-05 DIAGNOSIS — I10 Essential (primary) hypertension: Secondary | ICD-10-CM | POA: Diagnosis not present

## 2019-02-05 DIAGNOSIS — R7301 Impaired fasting glucose: Secondary | ICD-10-CM | POA: Diagnosis not present

## 2019-02-05 DIAGNOSIS — Z1231 Encounter for screening mammogram for malignant neoplasm of breast: Secondary | ICD-10-CM | POA: Diagnosis not present

## 2019-02-05 DIAGNOSIS — I6990 Unspecified sequelae of unspecified cerebrovascular disease: Secondary | ICD-10-CM | POA: Diagnosis not present

## 2019-02-05 DIAGNOSIS — J449 Chronic obstructive pulmonary disease, unspecified: Secondary | ICD-10-CM | POA: Diagnosis not present

## 2019-02-05 DIAGNOSIS — Z2821 Immunization not carried out because of patient refusal: Secondary | ICD-10-CM | POA: Diagnosis not present

## 2019-02-16 ENCOUNTER — Ambulatory Visit (INDEPENDENT_AMBULATORY_CARE_PROVIDER_SITE_OTHER): Payer: Medicare HMO | Admitting: *Deleted

## 2019-02-16 DIAGNOSIS — I63513 Cerebral infarction due to unspecified occlusion or stenosis of bilateral middle cerebral arteries: Secondary | ICD-10-CM | POA: Diagnosis not present

## 2019-02-16 LAB — CUP PACEART REMOTE DEVICE CHECK
Date Time Interrogation Session: 20201019184516
Implantable Pulse Generator Implant Date: 20180604

## 2019-03-04 DIAGNOSIS — F1120 Opioid dependence, uncomplicated: Secondary | ICD-10-CM | POA: Diagnosis not present

## 2019-03-04 DIAGNOSIS — I10 Essential (primary) hypertension: Secondary | ICD-10-CM | POA: Diagnosis not present

## 2019-03-04 DIAGNOSIS — M5412 Radiculopathy, cervical region: Secondary | ICD-10-CM | POA: Diagnosis not present

## 2019-03-04 DIAGNOSIS — M542 Cervicalgia: Secondary | ICD-10-CM | POA: Diagnosis not present

## 2019-03-04 DIAGNOSIS — I69153 Hemiplegia and hemiparesis following nontraumatic intracerebral hemorrhage affecting right non-dominant side: Secondary | ICD-10-CM | POA: Diagnosis not present

## 2019-03-04 DIAGNOSIS — H9193 Unspecified hearing loss, bilateral: Secondary | ICD-10-CM | POA: Diagnosis not present

## 2019-03-04 DIAGNOSIS — G894 Chronic pain syndrome: Secondary | ICD-10-CM | POA: Diagnosis not present

## 2019-03-04 DIAGNOSIS — I679 Cerebrovascular disease, unspecified: Secondary | ICD-10-CM | POA: Diagnosis not present

## 2019-03-09 NOTE — Progress Notes (Signed)
Carelink Summary Report / Loop Recorder 

## 2019-03-18 DIAGNOSIS — Z1231 Encounter for screening mammogram for malignant neoplasm of breast: Secondary | ICD-10-CM | POA: Diagnosis not present

## 2019-03-22 LAB — CUP PACEART REMOTE DEVICE CHECK
Date Time Interrogation Session: 20201121134625
Implantable Pulse Generator Implant Date: 20180604

## 2019-03-23 ENCOUNTER — Ambulatory Visit (INDEPENDENT_AMBULATORY_CARE_PROVIDER_SITE_OTHER): Payer: Medicare HMO | Admitting: *Deleted

## 2019-03-23 DIAGNOSIS — I63522 Cerebral infarction due to unspecified occlusion or stenosis of left anterior cerebral artery: Secondary | ICD-10-CM

## 2019-03-23 DIAGNOSIS — I63422 Cerebral infarction due to embolism of left anterior cerebral artery: Secondary | ICD-10-CM

## 2019-03-30 DIAGNOSIS — N6311 Unspecified lump in the right breast, upper outer quadrant: Secondary | ICD-10-CM | POA: Diagnosis not present

## 2019-03-30 DIAGNOSIS — N6011 Diffuse cystic mastopathy of right breast: Secondary | ICD-10-CM | POA: Diagnosis not present

## 2019-03-30 DIAGNOSIS — R928 Other abnormal and inconclusive findings on diagnostic imaging of breast: Secondary | ICD-10-CM | POA: Diagnosis not present

## 2019-04-08 DIAGNOSIS — Z79899 Other long term (current) drug therapy: Secondary | ICD-10-CM | POA: Diagnosis not present

## 2019-04-08 DIAGNOSIS — M542 Cervicalgia: Secondary | ICD-10-CM | POA: Diagnosis not present

## 2019-04-08 DIAGNOSIS — I69153 Hemiplegia and hemiparesis following nontraumatic intracerebral hemorrhage affecting right non-dominant side: Secondary | ICD-10-CM | POA: Diagnosis not present

## 2019-04-08 DIAGNOSIS — F1120 Opioid dependence, uncomplicated: Secondary | ICD-10-CM | POA: Diagnosis not present

## 2019-04-08 DIAGNOSIS — G894 Chronic pain syndrome: Secondary | ICD-10-CM | POA: Diagnosis not present

## 2019-04-08 DIAGNOSIS — I10 Essential (primary) hypertension: Secondary | ICD-10-CM | POA: Diagnosis not present

## 2019-04-08 DIAGNOSIS — M5412 Radiculopathy, cervical region: Secondary | ICD-10-CM | POA: Diagnosis not present

## 2019-04-08 DIAGNOSIS — I679 Cerebrovascular disease, unspecified: Secondary | ICD-10-CM | POA: Diagnosis not present

## 2019-04-23 ENCOUNTER — Ambulatory Visit (INDEPENDENT_AMBULATORY_CARE_PROVIDER_SITE_OTHER): Payer: Medicare HMO | Admitting: *Deleted

## 2019-04-23 DIAGNOSIS — I63422 Cerebral infarction due to embolism of left anterior cerebral artery: Secondary | ICD-10-CM

## 2019-04-23 LAB — CUP PACEART REMOTE DEVICE CHECK
Date Time Interrogation Session: 20201224134614
Implantable Pulse Generator Implant Date: 20180604

## 2019-04-26 NOTE — Progress Notes (Signed)
ILR remote 

## 2019-05-06 DIAGNOSIS — I679 Cerebrovascular disease, unspecified: Secondary | ICD-10-CM | POA: Diagnosis not present

## 2019-05-06 DIAGNOSIS — M5412 Radiculopathy, cervical region: Secondary | ICD-10-CM | POA: Diagnosis not present

## 2019-05-06 DIAGNOSIS — Z79899 Other long term (current) drug therapy: Secondary | ICD-10-CM | POA: Diagnosis not present

## 2019-05-06 DIAGNOSIS — F1120 Opioid dependence, uncomplicated: Secondary | ICD-10-CM | POA: Diagnosis not present

## 2019-05-06 DIAGNOSIS — I69153 Hemiplegia and hemiparesis following nontraumatic intracerebral hemorrhage affecting right non-dominant side: Secondary | ICD-10-CM | POA: Diagnosis not present

## 2019-05-06 DIAGNOSIS — M542 Cervicalgia: Secondary | ICD-10-CM | POA: Diagnosis not present

## 2019-05-06 DIAGNOSIS — I10 Essential (primary) hypertension: Secondary | ICD-10-CM | POA: Diagnosis not present

## 2019-05-06 DIAGNOSIS — G894 Chronic pain syndrome: Secondary | ICD-10-CM | POA: Diagnosis not present

## 2019-05-08 DIAGNOSIS — R7301 Impaired fasting glucose: Secondary | ICD-10-CM | POA: Diagnosis not present

## 2019-05-08 DIAGNOSIS — J449 Chronic obstructive pulmonary disease, unspecified: Secondary | ICD-10-CM | POA: Diagnosis not present

## 2019-05-08 DIAGNOSIS — Z789 Other specified health status: Secondary | ICD-10-CM | POA: Diagnosis not present

## 2019-05-08 DIAGNOSIS — E559 Vitamin D deficiency, unspecified: Secondary | ICD-10-CM | POA: Diagnosis not present

## 2019-05-08 DIAGNOSIS — E669 Obesity, unspecified: Secondary | ICD-10-CM | POA: Diagnosis not present

## 2019-05-08 DIAGNOSIS — I10 Essential (primary) hypertension: Secondary | ICD-10-CM | POA: Diagnosis not present

## 2019-05-08 DIAGNOSIS — I6990 Unspecified sequelae of unspecified cerebrovascular disease: Secondary | ICD-10-CM | POA: Diagnosis not present

## 2019-05-08 DIAGNOSIS — E785 Hyperlipidemia, unspecified: Secondary | ICD-10-CM | POA: Diagnosis not present

## 2019-05-13 DIAGNOSIS — E559 Vitamin D deficiency, unspecified: Secondary | ICD-10-CM | POA: Diagnosis not present

## 2019-05-13 DIAGNOSIS — E785 Hyperlipidemia, unspecified: Secondary | ICD-10-CM | POA: Diagnosis not present

## 2019-05-13 DIAGNOSIS — I10 Essential (primary) hypertension: Secondary | ICD-10-CM | POA: Diagnosis not present

## 2019-05-13 DIAGNOSIS — Z713 Dietary counseling and surveillance: Secondary | ICD-10-CM | POA: Diagnosis not present

## 2019-05-13 DIAGNOSIS — Z6835 Body mass index (BMI) 35.0-35.9, adult: Secondary | ICD-10-CM | POA: Diagnosis not present

## 2019-05-20 DIAGNOSIS — Z1331 Encounter for screening for depression: Secondary | ICD-10-CM | POA: Diagnosis not present

## 2019-05-20 DIAGNOSIS — Z9181 History of falling: Secondary | ICD-10-CM | POA: Diagnosis not present

## 2019-05-20 DIAGNOSIS — Z2821 Immunization not carried out because of patient refusal: Secondary | ICD-10-CM | POA: Diagnosis not present

## 2019-05-20 DIAGNOSIS — Z0000 Encounter for general adult medical examination without abnormal findings: Secondary | ICD-10-CM | POA: Diagnosis not present

## 2019-05-20 DIAGNOSIS — Z136 Encounter for screening for cardiovascular disorders: Secondary | ICD-10-CM | POA: Diagnosis not present

## 2019-05-20 DIAGNOSIS — Z6838 Body mass index (BMI) 38.0-38.9, adult: Secondary | ICD-10-CM | POA: Diagnosis not present

## 2019-05-20 DIAGNOSIS — Z1211 Encounter for screening for malignant neoplasm of colon: Secondary | ICD-10-CM | POA: Diagnosis not present

## 2019-05-20 DIAGNOSIS — E785 Hyperlipidemia, unspecified: Secondary | ICD-10-CM | POA: Diagnosis not present

## 2019-05-25 ENCOUNTER — Ambulatory Visit (INDEPENDENT_AMBULATORY_CARE_PROVIDER_SITE_OTHER): Payer: Medicare HMO | Admitting: *Deleted

## 2019-05-25 DIAGNOSIS — I63422 Cerebral infarction due to embolism of left anterior cerebral artery: Secondary | ICD-10-CM | POA: Diagnosis not present

## 2019-05-25 LAB — CUP PACEART REMOTE DEVICE CHECK
Date Time Interrogation Session: 20210124231615
Implantable Pulse Generator Implant Date: 20180604

## 2019-06-03 ENCOUNTER — Telehealth: Payer: Self-pay

## 2019-06-03 DIAGNOSIS — M5412 Radiculopathy, cervical region: Secondary | ICD-10-CM | POA: Diagnosis not present

## 2019-06-03 DIAGNOSIS — I10 Essential (primary) hypertension: Secondary | ICD-10-CM | POA: Diagnosis not present

## 2019-06-03 DIAGNOSIS — Z79899 Other long term (current) drug therapy: Secondary | ICD-10-CM | POA: Diagnosis not present

## 2019-06-03 DIAGNOSIS — M542 Cervicalgia: Secondary | ICD-10-CM | POA: Diagnosis not present

## 2019-06-03 DIAGNOSIS — I69153 Hemiplegia and hemiparesis following nontraumatic intracerebral hemorrhage affecting right non-dominant side: Secondary | ICD-10-CM | POA: Diagnosis not present

## 2019-06-03 DIAGNOSIS — I679 Cerebrovascular disease, unspecified: Secondary | ICD-10-CM | POA: Diagnosis not present

## 2019-06-03 DIAGNOSIS — G894 Chronic pain syndrome: Secondary | ICD-10-CM | POA: Diagnosis not present

## 2019-06-03 DIAGNOSIS — F1120 Opioid dependence, uncomplicated: Secondary | ICD-10-CM | POA: Diagnosis not present

## 2019-06-03 NOTE — Telephone Encounter (Signed)
Called and LVM for patient to call back to answer questions regarding virtual ov

## 2019-06-05 ENCOUNTER — Telehealth: Payer: Self-pay

## 2019-06-05 ENCOUNTER — Telehealth (INDEPENDENT_AMBULATORY_CARE_PROVIDER_SITE_OTHER): Payer: Medicare HMO | Admitting: Gastroenterology

## 2019-06-05 ENCOUNTER — Encounter: Payer: Self-pay | Admitting: Gastroenterology

## 2019-06-05 ENCOUNTER — Other Ambulatory Visit: Payer: Self-pay

## 2019-06-05 VITALS — Ht <= 58 in | Wt 190.0 lb

## 2019-06-05 DIAGNOSIS — Z86010 Personal history of colonic polyps: Secondary | ICD-10-CM

## 2019-06-05 MED ORDER — SUPREP BOWEL PREP KIT 17.5-3.13-1.6 GM/177ML PO SOLN: 1.0000 | ORAL | 0 refills | Status: DC

## 2019-06-05 NOTE — Progress Notes (Signed)
Chief Complaint:   Referring Provider:  Nicoletta Dress, MD      ASSESSMENT AND PLAN;   #1. H/O polyps  #2.  Comorbid conditions include H/O CVA, chronic pain syndrome d/t DJD, anxiety/depression, fibromyalgia, COPD, chronic fatigue syndrome.  Plan: - Proceed with colonoscopy after 2-day prep. Discussed risks & benefits. (Risks including rare perforation req laparotomy, bleeding after bx/polypectomy req blood transfusion, rarely missing neoplasms, risks of anesthesia/sedation). Benefits outweigh the risks. Patient agrees to proceed. All the questions were answered. Consent forms given for review. -Cardio clearence from Dr. Caryl Comes prior, to make sure it is okay to hold Plavix 5 days before.  She can take baby aspirin 81 mg p.o. QD while she is off Plavix.    HPI:    Lynn Rollins is a 54 y.o. female  H/O CVA 2019, has loop recorder since 2018, being followed by Dr. Caryl Comes.  She is currently on Plavix.  Would like to get her colonoscopy performed due to previous H/O colonic polyps.  No nausea, vomiting, heartburn, regurgitation, odynophagia or dysphagia.  No significant diarrhea or constipation.  No melena or hematochezia. No unintentional weight loss. No abdominal pain.  Has dizziness, constant buzz in year (despite BAHA) after CVA.   Past GI procedures: -EGD 08/2013: Mild gastritis.  Negative CLOtest. -Colonoscopy 03/2015 ([facility]): Poor preparation, mild sigmoid diverticulosis.  Colonoscopy 07/2008 multiple colonic polyps SP polypectomy Bx-tubular adenomas.  Negative random colonic biopsies for microscopic colitis.  Past Medical History:  Diagnosis Date  . Acute ischemic left ACA stroke ([place])   . Anxiety   . Asthma   . Brain lesion 2011-2012   MRI  . Chronic fatigue   . Chronic pain syndrome   . COPD (chronic obstructive pulmonary disease) ([place])   . Degeneration of C5-C6 intervertebral disc   . Depression   . Eustachian tube dysfunction   . Fibromyalgia   . H. pylori  infection   . Hearing loss   . Hemorrhoid   . Hiatal hernia with GERD   . Hyperlipemia   . Hypertension   . IBS (irritable bowel syndrome)   . Migraine   . OSA on CPAP   . Scalp psoriasis   . Stroke ([place])   . Vitamin D deficiency     Past Surgical History:  Procedure Laterality Date  . ABDOMINAL HYSTERECTOMY    . CHOLECYSTECTOMY    . COLONOSCOPY  04/28/2015   Mild sigmoid diverticulosis. Otherwise normal colonoscopy  . ESOPHAGOGASTRODUODENOSCOPY  09/11/2013   Mild gastritis. Otherwise normal esophagogastroduodenoscopy  . LOOP RECORDER INSERTION N/A 10/01/2016   Procedure: Loop Recorder Insertion;  Surgeon: Deboraha Sprang, MD;  Location: [facility];  Service: Cardiovascular;  Laterality: N/A;  . OTHER SURGICAL HISTORY     screw in head. Bone anch Baha hearing aid  . TEE WITHOUT CARDIOVERSION N/A 10/01/2016   Procedure: TRANSESOPHAGEAL ECHOCARDIOGRAM (TEE);  Surgeon: Acie Fredrickson Wonda Cheng, MD;  Location: [facility] ENDOSCOPY;  Service: Cardiovascular;  Laterality: N/A;  . TUBAL LIGATION      Family History  Problem Relation Age of Onset  . Stroke Mother   . Parkinson's disease Father   . Heart disease Father   . Stroke Father   . Other Father        esophageal stricture  . Multiple sclerosis Brother   . Esophageal cancer Neg Hx     Social History   Tobacco Use  . Smoking status: Never Smoker  . Smokeless tobacco: Never Used  Substance Use Topics  . Alcohol use: No  . Drug use: No    Current Outpatient Medications  Medication Sig Dispense Refill  . albuterol (PROVENTIL HFA;VENTOLIN HFA) 108 (90 BASE) MCG/ACT inhaler Inhale 2 puffs into the lungs every 4 (four) hours as needed for wheezing.     Marland Kitchen BREO ELLIPTA 200-25 MCG/INH AEPB Inhale 1 puff into the lungs as needed.   3  . clopidogrel (PLAVIX) 75 MG tablet Take 75 mg by mouth daily.    Marland Kitchen DEXILANT 60 MG capsule Take 60 mg by mouth daily.  4  . famotidine (PEPCID) 40 MG tablet 1 tablet daily.    Marland Kitchen gabapentin  (NEURONTIN) 300 MG capsule Take 300 mg by mouth at bedtime.    Marland Kitchen ipratropium-albuterol (DUONEB) 0.5-2.5 (3) MG/3ML SOLN Inhale 3 mLs into the lungs every 6 (six) hours as needed (for shortness of breath or wheezing).     Marland Kitchen lisinopril (PRINIVIL,ZESTRIL) 40 MG tablet Take 40 mg by mouth daily.    . Menthol-Camphor (TIGER BALM ARTHRITIS RUB EX) Apply 1 application topically daily as needed (for back pain).    . OXYCONTIN 10 MG 12 hr tablet Take 10 mg by mouth QID.     Marland Kitchen SPIRIVA RESPIMAT 1.25 MCG/ACT AERS Inhale 1 puff into the lungs as needed.   3  . temazepam (RESTORIL) 30 MG capsule Take 1 capsule (30 mg total) by mouth at bedtime. 30 capsule 5  . Vitamin D, Ergocalciferol, (DRISDOL) 50000 units CAPS capsule Take 50,000 Units by mouth every Saturday.   12  . cloNIDine (CATAPRES) 0.1 MG tablet Take 0.1 mg by mouth 3 (three) times daily.    . diazepam (VALIUM) 5 MG tablet Take 0.5 tablets (2.5 mg total) by mouth 3 (three) times daily as needed for anxiety. (Patient not taking: Reported on 06/05/2019) 30 tablet 0  . dicyclomine (BENTYL) 20 MG tablet Take 20 mg by mouth every 6 (six) hours as needed for spasms.     . mupirocin ointment (BACTROBAN) 2 % Apply 1 application topically as needed.     . ondansetron (ZOFRAN ODT) 4 MG disintegrating tablet Take 1 tablet (4 mg total) by mouth every 8 (eight) hours as needed for nausea or vomiting. (Patient not taking: Reported on 06/05/2019) 20 tablet 0   No current facility-administered medications for this visit.    Allergies  Allergen Reactions  . Celexa [Citalopram Hydrobromide] Other (See Comments)    "Made me cry and made me worse"  . Tape Rash and Other (See Comments)    Patient has Fibromyalgia and cannot tolerate medical tape and it's removal  . Broccoli [Brassica Oleracea]   . Statins Other (See Comments)    Muscles cramp    Review of Systems:  Constitutional: Denies fever, chills, diaphoresis, appetite change and fatigue.  HEENT: Denies  photophobia, eye pain, redness, hearing loss, ear pain, congestion, sore throat, rhinorrhea, sneezing, mouth sores, neck pain, neck stiffness and tinnitus.   Respiratory: Denies SOB, DOE, cough, chest tightness,  and wheezing.   Cardiovascular: Denies chest pain, palpitations and leg swelling.  Genitourinary: Denies dysuria, urgency, frequency, hematuria, flank pain and difficulty urinating.  Musculoskeletal: Has myalgias, back pain, joint swelling, arthralgias and gait problem.  Skin: No rash.  Neurological: Has dizziness,  weakness, light-headedness, numbness and headaches.  No seizures. Hematological: Denies adenopathy. Easy bruising, personal or family bleeding history  Psychiatric/Behavioral: Has anxiety or depression     Physical Exam:    Ht 4\' 10"  (1.473 m)  Wt 190 lb (86.2 kg)   BMI 39.71 kg/m  Wt Readings from Last 3 Encounters:  06/05/19 190 lb (86.2 kg)  01/07/18 181 lb (82.1 kg)  09/09/17 180 lb 12.8 oz (82 kg)   Televisit  Data Reviewed: I have personally reviewed following labs and imaging studies  CBC: CBC Latest Ref Rng & Units 08/13/2017 08/09/2017 08/08/2017  WBC 4.0 - 10.5 K/uL 13.9(H) 9.6 -  Hemoglobin 12.0 - 15.0 g/dL 14.7 13.2 14.3  Hematocrit 36.0 - 46.0 % 43.0 40.1 42.0  Platelets 150 - 400 K/uL 297 282 -    CMP: CMP Latest Ref Rng & Units 08/13/2017 08/09/2017 08/08/2017  Glucose 65 - 99 mg/dL 154(H) 105(H) 132(H)  BUN 6 - 20 mg/dL 11 13 12   Creatinine 0.44 - 1.00 mg/dL 0.79 0.74 0.60  Sodium 135 - 145 mmol/L 138 138 140  Potassium 3.5 - 5.1 mmol/L 4.4 4.1 4.2  Chloride 101 - 111 mmol/L 106 105 103  CO2 22 - 32 mmol/L 22 23 -  Calcium 8.9 - 10.3 mg/dL 9.7 9.0 -  Total Protein 6.5 - 8.1 g/dL 7.6 - -  Total Bilirubin 0.3 - 1.2 mg/dL 0.5 - -  Alkaline Phos 38 - 126 U/L 76 - -  AST 15 - 41 U/L 48(H) - -  ALT 14 - 54 U/L 49 - -     Radiology Studies: CUP PACEART REMOTE DEVICE CHECK  Result Date: 05/25/2019 Carelink summary report received.  Battery status OK. Normal device function. No new symptom episodes, tachy episodes, brady, or pause episodes. No new AF episodes. Monthly summary reports and ROV/PRN  I connected with  Lynn Rollins on 06/05/19 by a video enabled telemedicine application and verified that I am speaking with the correct person using two identifiers.   I discussed the limitations of evaluation and management by telemedicine. The patient expressed understanding and agreed to proceed.     Carmell Austria, MD 06/05/2019, 3:04 PM  Cc: Nicoletta Dress, MD

## 2019-06-05 NOTE — Telephone Encounter (Signed)
[facility] Pre-operative Risk Assessment     Request for surgical clearance:     Endoscopy Procedure  What type of surgery is being performed?     Colonoscopy  When is this surgery scheduled?     Not scheduled yet  What type of clearance is required ?   Pharmacy  Are there any medications that need to be held prior to surgery and how long? Plavix 5 days   Practice name and name of physician performing surgery?      [facility]  What is your office phone and fax number?      Phone- [phone]  [phone]  Anesthesia type (None, local, MAC, general) ?       MAC

## 2019-06-05 NOTE — Telephone Encounter (Addendum)
We have not seen this patient since 2018 and do not prescribe her Plavix.   Please contact the Dr Lyndel Safe with [facility] and let them know they need to contact the prescribing MD.   We follow her loop recorder. She is likely on the Plavix because of her CVA.   Rosaria Ferries, PA-C 06/05/2019 4:34 PM

## 2019-06-05 NOTE — Patient Instructions (Addendum)
If you are age 54 or older, your body mass index should be between 23-30. Your Body mass index is 39.71 kg/m. If this is out of the aforementioned range listed, please consider follow up with your Primary Care Provider.  If you are age 52 or younger, your body mass index should be between 19-25. Your Body mass index is 39.71 kg/m. If this is out of the aformentioned range listed, please consider follow up with your Primary Care Provider.   It has been recommended to you by your physician that you have a(n) Colonoscopy completed. Per your request, we did not schedule the procedure(s) today. Please contact our office at [phone] should you decide to have the procedure completed. You will be scheduled for a pre-visit and procedure at that time.   We have sent the following medications to your pharmacy for you to pick up at your convenience: [place] will be contacted by our office prior to your procedure for directions on holding your Plavix.  If you do not hear from our office 1 week prior to your scheduled procedure, please call [phone] to discuss.   Two days before your procedure: Mix 3 packs (or capfuls) of Miralax in 48 ounces of clear liquid and drink at 6pm. Thank you,  Dr. Jackquline Denmark

## 2019-06-08 NOTE — Telephone Encounter (Signed)
Trish Can we get her cleared from neurology to hold Plavix 5 days prior.  Looks like neurology put her on Plavix.  RG

## 2019-06-10 NOTE — Telephone Encounter (Signed)
I have called and left message for patient to call me back to see who she sees for Neurology.

## 2019-06-15 DIAGNOSIS — J45909 Unspecified asthma, uncomplicated: Secondary | ICD-10-CM | POA: Diagnosis not present

## 2019-06-15 DIAGNOSIS — I10 Essential (primary) hypertension: Secondary | ICD-10-CM | POA: Diagnosis not present

## 2019-06-15 DIAGNOSIS — R739 Hyperglycemia, unspecified: Secondary | ICD-10-CM | POA: Diagnosis not present

## 2019-06-15 DIAGNOSIS — E785 Hyperlipidemia, unspecified: Secondary | ICD-10-CM | POA: Diagnosis not present

## 2019-06-15 DIAGNOSIS — Z713 Dietary counseling and surveillance: Secondary | ICD-10-CM | POA: Diagnosis not present

## 2019-06-15 DIAGNOSIS — Z8673 Personal history of transient ischemic attack (TIA), and cerebral infarction without residual deficits: Secondary | ICD-10-CM | POA: Diagnosis not present

## 2019-06-17 NOTE — Telephone Encounter (Signed)
I have called patient and left another message for her to return my call.

## 2019-06-25 ENCOUNTER — Ambulatory Visit (INDEPENDENT_AMBULATORY_CARE_PROVIDER_SITE_OTHER): Payer: Medicare HMO | Admitting: *Deleted

## 2019-06-25 DIAGNOSIS — I63422 Cerebral infarction due to embolism of left anterior cerebral artery: Secondary | ICD-10-CM

## 2019-06-25 LAB — CUP PACEART REMOTE DEVICE CHECK
Date Time Interrogation Session: 20210224235553
Implantable Pulse Generator Implant Date: 20180604

## 2019-06-25 NOTE — Progress Notes (Signed)
ILR Remote 

## 2019-07-08 DIAGNOSIS — F1120 Opioid dependence, uncomplicated: Secondary | ICD-10-CM | POA: Diagnosis not present

## 2019-07-08 DIAGNOSIS — G894 Chronic pain syndrome: Secondary | ICD-10-CM | POA: Diagnosis not present

## 2019-07-08 DIAGNOSIS — I10 Essential (primary) hypertension: Secondary | ICD-10-CM | POA: Diagnosis not present

## 2019-07-08 DIAGNOSIS — M542 Cervicalgia: Secondary | ICD-10-CM | POA: Diagnosis not present

## 2019-07-08 DIAGNOSIS — M5412 Radiculopathy, cervical region: Secondary | ICD-10-CM | POA: Diagnosis not present

## 2019-07-08 DIAGNOSIS — I679 Cerebrovascular disease, unspecified: Secondary | ICD-10-CM | POA: Diagnosis not present

## 2019-07-08 DIAGNOSIS — Z79899 Other long term (current) drug therapy: Secondary | ICD-10-CM | POA: Diagnosis not present

## 2019-07-08 DIAGNOSIS — I69153 Hemiplegia and hemiparesis following nontraumatic intracerebral hemorrhage affecting right non-dominant side: Secondary | ICD-10-CM | POA: Diagnosis not present

## 2019-07-13 DIAGNOSIS — R7301 Impaired fasting glucose: Secondary | ICD-10-CM | POA: Diagnosis not present

## 2019-07-13 DIAGNOSIS — E785 Hyperlipidemia, unspecified: Secondary | ICD-10-CM | POA: Diagnosis not present

## 2019-07-13 DIAGNOSIS — I6990 Unspecified sequelae of unspecified cerebrovascular disease: Secondary | ICD-10-CM | POA: Diagnosis not present

## 2019-07-13 DIAGNOSIS — E559 Vitamin D deficiency, unspecified: Secondary | ICD-10-CM | POA: Diagnosis not present

## 2019-07-13 DIAGNOSIS — E669 Obesity, unspecified: Secondary | ICD-10-CM | POA: Diagnosis not present

## 2019-07-13 DIAGNOSIS — Z713 Dietary counseling and surveillance: Secondary | ICD-10-CM | POA: Diagnosis not present

## 2019-07-13 DIAGNOSIS — Z6838 Body mass index (BMI) 38.0-38.9, adult: Secondary | ICD-10-CM | POA: Diagnosis not present

## 2019-07-13 DIAGNOSIS — I10 Essential (primary) hypertension: Secondary | ICD-10-CM | POA: Diagnosis not present

## 2019-07-13 DIAGNOSIS — J449 Chronic obstructive pulmonary disease, unspecified: Secondary | ICD-10-CM | POA: Diagnosis not present

## 2019-07-26 LAB — CUP PACEART REMOTE DEVICE CHECK
Date Time Interrogation Session: 20210328022533
Implantable Pulse Generator Implant Date: 20180604

## 2019-07-27 ENCOUNTER — Ambulatory Visit (INDEPENDENT_AMBULATORY_CARE_PROVIDER_SITE_OTHER): Payer: Medicare HMO | Admitting: *Deleted

## 2019-07-27 DIAGNOSIS — I63422 Cerebral infarction due to embolism of left anterior cerebral artery: Secondary | ICD-10-CM

## 2019-07-27 NOTE — Progress Notes (Signed)
ILR Remote 

## 2019-08-06 DIAGNOSIS — E559 Vitamin D deficiency, unspecified: Secondary | ICD-10-CM | POA: Diagnosis not present

## 2019-08-06 DIAGNOSIS — I6990 Unspecified sequelae of unspecified cerebrovascular disease: Secondary | ICD-10-CM | POA: Diagnosis not present

## 2019-08-06 DIAGNOSIS — J449 Chronic obstructive pulmonary disease, unspecified: Secondary | ICD-10-CM | POA: Diagnosis not present

## 2019-08-06 DIAGNOSIS — I10 Essential (primary) hypertension: Secondary | ICD-10-CM | POA: Diagnosis not present

## 2019-08-06 DIAGNOSIS — E785 Hyperlipidemia, unspecified: Secondary | ICD-10-CM | POA: Diagnosis not present

## 2019-08-06 DIAGNOSIS — R7301 Impaired fasting glucose: Secondary | ICD-10-CM | POA: Diagnosis not present

## 2019-08-21 DIAGNOSIS — E669 Obesity, unspecified: Secondary | ICD-10-CM | POA: Diagnosis not present

## 2019-08-21 DIAGNOSIS — Z6838 Body mass index (BMI) 38.0-38.9, adult: Secondary | ICD-10-CM | POA: Diagnosis not present

## 2019-08-27 LAB — CUP PACEART REMOTE DEVICE CHECK
Date Time Interrogation Session: 20210428232051
Implantable Pulse Generator Implant Date: 20180604

## 2019-08-31 ENCOUNTER — Ambulatory Visit (INDEPENDENT_AMBULATORY_CARE_PROVIDER_SITE_OTHER): Payer: Medicare HMO | Admitting: *Deleted

## 2019-08-31 DIAGNOSIS — I63513 Cerebral infarction due to unspecified occlusion or stenosis of bilateral middle cerebral arteries: Secondary | ICD-10-CM | POA: Diagnosis not present

## 2019-08-31 NOTE — Progress Notes (Signed)
Carelink Summary Report / Loop Recorder 

## 2019-09-02 DIAGNOSIS — M5412 Radiculopathy, cervical region: Secondary | ICD-10-CM | POA: Diagnosis not present

## 2019-09-02 DIAGNOSIS — G894 Chronic pain syndrome: Secondary | ICD-10-CM | POA: Diagnosis not present

## 2019-09-02 DIAGNOSIS — I69153 Hemiplegia and hemiparesis following nontraumatic intracerebral hemorrhage affecting right non-dominant side: Secondary | ICD-10-CM | POA: Diagnosis not present

## 2019-09-02 DIAGNOSIS — M542 Cervicalgia: Secondary | ICD-10-CM | POA: Diagnosis not present

## 2019-09-02 DIAGNOSIS — I679 Cerebrovascular disease, unspecified: Secondary | ICD-10-CM | POA: Diagnosis not present

## 2019-09-02 DIAGNOSIS — Z79899 Other long term (current) drug therapy: Secondary | ICD-10-CM | POA: Diagnosis not present

## 2019-09-02 DIAGNOSIS — I10 Essential (primary) hypertension: Secondary | ICD-10-CM | POA: Diagnosis not present

## 2019-09-02 DIAGNOSIS — F1120 Opioid dependence, uncomplicated: Secondary | ICD-10-CM | POA: Diagnosis not present

## 2019-09-29 ENCOUNTER — Ambulatory Visit (INDEPENDENT_AMBULATORY_CARE_PROVIDER_SITE_OTHER): Payer: Medicare HMO | Admitting: *Deleted

## 2019-09-29 DIAGNOSIS — I63513 Cerebral infarction due to unspecified occlusion or stenosis of bilateral middle cerebral arteries: Secondary | ICD-10-CM

## 2019-09-29 LAB — CUP PACEART REMOTE DEVICE CHECK
Date Time Interrogation Session: 20210529232830
Implantable Pulse Generator Implant Date: 20180604

## 2019-09-30 NOTE — Progress Notes (Signed)
Carelink Summary Report / Loop Recorder 

## 2019-10-02 DIAGNOSIS — Z6838 Body mass index (BMI) 38.0-38.9, adult: Secondary | ICD-10-CM | POA: Diagnosis not present

## 2019-10-28 DIAGNOSIS — Z79899 Other long term (current) drug therapy: Secondary | ICD-10-CM | POA: Diagnosis not present

## 2019-10-28 DIAGNOSIS — I69153 Hemiplegia and hemiparesis following nontraumatic intracerebral hemorrhage affecting right non-dominant side: Secondary | ICD-10-CM | POA: Diagnosis not present

## 2019-10-28 DIAGNOSIS — I10 Essential (primary) hypertension: Secondary | ICD-10-CM | POA: Diagnosis not present

## 2019-10-28 DIAGNOSIS — F1120 Opioid dependence, uncomplicated: Secondary | ICD-10-CM | POA: Diagnosis not present

## 2019-10-28 DIAGNOSIS — M5412 Radiculopathy, cervical region: Secondary | ICD-10-CM | POA: Diagnosis not present

## 2019-10-28 DIAGNOSIS — M542 Cervicalgia: Secondary | ICD-10-CM | POA: Diagnosis not present

## 2019-10-28 DIAGNOSIS — I679 Cerebrovascular disease, unspecified: Secondary | ICD-10-CM | POA: Diagnosis not present

## 2019-10-28 DIAGNOSIS — G894 Chronic pain syndrome: Secondary | ICD-10-CM | POA: Diagnosis not present

## 2019-10-30 ENCOUNTER — Ambulatory Visit (INDEPENDENT_AMBULATORY_CARE_PROVIDER_SITE_OTHER): Payer: Medicare HMO | Admitting: *Deleted

## 2019-10-30 DIAGNOSIS — I63513 Cerebral infarction due to unspecified occlusion or stenosis of bilateral middle cerebral arteries: Secondary | ICD-10-CM

## 2019-10-30 LAB — CUP PACEART REMOTE DEVICE CHECK
Date Time Interrogation Session: 20210701230944
Implantable Pulse Generator Implant Date: 20180604

## 2019-11-03 NOTE — Progress Notes (Signed)
Carelink Summary Report / Loop Recorder 

## 2019-11-11 DIAGNOSIS — I6990 Unspecified sequelae of unspecified cerebrovascular disease: Secondary | ICD-10-CM | POA: Diagnosis not present

## 2019-11-11 DIAGNOSIS — J449 Chronic obstructive pulmonary disease, unspecified: Secondary | ICD-10-CM | POA: Diagnosis not present

## 2019-11-11 DIAGNOSIS — R7301 Impaired fasting glucose: Secondary | ICD-10-CM | POA: Diagnosis not present

## 2019-11-11 DIAGNOSIS — E785 Hyperlipidemia, unspecified: Secondary | ICD-10-CM | POA: Diagnosis not present

## 2019-11-11 DIAGNOSIS — N390 Urinary tract infection, site not specified: Secondary | ICD-10-CM | POA: Diagnosis not present

## 2019-11-11 DIAGNOSIS — I10 Essential (primary) hypertension: Secondary | ICD-10-CM | POA: Diagnosis not present

## 2019-11-11 DIAGNOSIS — E559 Vitamin D deficiency, unspecified: Secondary | ICD-10-CM | POA: Diagnosis not present

## 2019-11-11 DIAGNOSIS — N3281 Overactive bladder: Secondary | ICD-10-CM | POA: Diagnosis not present

## 2019-11-25 DIAGNOSIS — Z79899 Other long term (current) drug therapy: Secondary | ICD-10-CM | POA: Diagnosis not present

## 2019-11-25 DIAGNOSIS — I679 Cerebrovascular disease, unspecified: Secondary | ICD-10-CM | POA: Diagnosis not present

## 2019-11-25 DIAGNOSIS — I10 Essential (primary) hypertension: Secondary | ICD-10-CM | POA: Diagnosis not present

## 2019-11-25 DIAGNOSIS — M542 Cervicalgia: Secondary | ICD-10-CM | POA: Diagnosis not present

## 2019-11-25 DIAGNOSIS — F1120 Opioid dependence, uncomplicated: Secondary | ICD-10-CM | POA: Diagnosis not present

## 2019-11-25 DIAGNOSIS — I69153 Hemiplegia and hemiparesis following nontraumatic intracerebral hemorrhage affecting right non-dominant side: Secondary | ICD-10-CM | POA: Diagnosis not present

## 2019-11-25 DIAGNOSIS — M5412 Radiculopathy, cervical region: Secondary | ICD-10-CM | POA: Diagnosis not present

## 2019-11-25 DIAGNOSIS — G894 Chronic pain syndrome: Secondary | ICD-10-CM | POA: Diagnosis not present

## 2019-12-05 LAB — CUP PACEART REMOTE DEVICE CHECK
Date Time Interrogation Session: 20210803231622
Implantable Pulse Generator Implant Date: 20180604

## 2019-12-07 ENCOUNTER — Ambulatory Visit (INDEPENDENT_AMBULATORY_CARE_PROVIDER_SITE_OTHER): Payer: Medicare HMO | Admitting: *Deleted

## 2019-12-07 DIAGNOSIS — I63513 Cerebral infarction due to unspecified occlusion or stenosis of bilateral middle cerebral arteries: Secondary | ICD-10-CM

## 2019-12-08 NOTE — Progress Notes (Signed)
Carelink Summary Report / Loop Recorder 

## 2019-12-16 DIAGNOSIS — H5213 Myopia, bilateral: Secondary | ICD-10-CM | POA: Diagnosis not present

## 2020-01-07 DIAGNOSIS — H9192 Unspecified hearing loss, left ear: Secondary | ICD-10-CM | POA: Diagnosis not present

## 2020-01-07 DIAGNOSIS — Z9621 Cochlear implant status: Secondary | ICD-10-CM | POA: Diagnosis not present

## 2020-01-07 DIAGNOSIS — H9312 Tinnitus, left ear: Secondary | ICD-10-CM | POA: Diagnosis not present

## 2020-01-07 DIAGNOSIS — H9042 Sensorineural hearing loss, unilateral, left ear, with unrestricted hearing on the contralateral side: Secondary | ICD-10-CM | POA: Diagnosis not present

## 2020-01-07 DIAGNOSIS — R42 Dizziness and giddiness: Secondary | ICD-10-CM | POA: Diagnosis not present

## 2020-01-07 DIAGNOSIS — Z9889 Other specified postprocedural states: Secondary | ICD-10-CM | POA: Diagnosis not present

## 2020-01-07 DIAGNOSIS — Z719 Counseling, unspecified: Secondary | ICD-10-CM | POA: Diagnosis not present

## 2020-01-11 ENCOUNTER — Ambulatory Visit (INDEPENDENT_AMBULATORY_CARE_PROVIDER_SITE_OTHER): Payer: Medicare HMO | Admitting: *Deleted

## 2020-01-11 DIAGNOSIS — I63513 Cerebral infarction due to unspecified occlusion or stenosis of bilateral middle cerebral arteries: Secondary | ICD-10-CM | POA: Diagnosis not present

## 2020-01-11 LAB — CUP PACEART REMOTE DEVICE CHECK
Date Time Interrogation Session: 20210905233454
Implantable Pulse Generator Implant Date: 20180604

## 2020-01-13 NOTE — Progress Notes (Signed)
Carelink Summary Report / Loop Recorder 

## 2020-01-27 DIAGNOSIS — M542 Cervicalgia: Secondary | ICD-10-CM | POA: Diagnosis not present

## 2020-01-27 DIAGNOSIS — I679 Cerebrovascular disease, unspecified: Secondary | ICD-10-CM | POA: Diagnosis not present

## 2020-01-27 DIAGNOSIS — Z79891 Long term (current) use of opiate analgesic: Secondary | ICD-10-CM | POA: Diagnosis not present

## 2020-01-27 DIAGNOSIS — M5412 Radiculopathy, cervical region: Secondary | ICD-10-CM | POA: Diagnosis not present

## 2020-01-27 DIAGNOSIS — F1120 Opioid dependence, uncomplicated: Secondary | ICD-10-CM | POA: Diagnosis not present

## 2020-01-27 DIAGNOSIS — I10 Essential (primary) hypertension: Secondary | ICD-10-CM | POA: Diagnosis not present

## 2020-01-27 DIAGNOSIS — I69153 Hemiplegia and hemiparesis following nontraumatic intracerebral hemorrhage affecting right non-dominant side: Secondary | ICD-10-CM | POA: Diagnosis not present

## 2020-01-27 DIAGNOSIS — G894 Chronic pain syndrome: Secondary | ICD-10-CM | POA: Diagnosis not present

## 2020-02-06 LAB — CUP PACEART REMOTE DEVICE CHECK
Date Time Interrogation Session: 20211008233758
Implantable Pulse Generator Implant Date: 20180604

## 2020-02-11 DIAGNOSIS — I10 Essential (primary) hypertension: Secondary | ICD-10-CM | POA: Diagnosis not present

## 2020-02-11 DIAGNOSIS — E785 Hyperlipidemia, unspecified: Secondary | ICD-10-CM | POA: Diagnosis not present

## 2020-02-11 DIAGNOSIS — J449 Chronic obstructive pulmonary disease, unspecified: Secondary | ICD-10-CM | POA: Diagnosis not present

## 2020-02-11 DIAGNOSIS — E559 Vitamin D deficiency, unspecified: Secondary | ICD-10-CM | POA: Diagnosis not present

## 2020-02-11 DIAGNOSIS — I6990 Unspecified sequelae of unspecified cerebrovascular disease: Secondary | ICD-10-CM | POA: Diagnosis not present

## 2020-02-11 DIAGNOSIS — Z1231 Encounter for screening mammogram for malignant neoplasm of breast: Secondary | ICD-10-CM | POA: Diagnosis not present

## 2020-02-11 DIAGNOSIS — R7301 Impaired fasting glucose: Secondary | ICD-10-CM | POA: Diagnosis not present

## 2020-02-15 ENCOUNTER — Ambulatory Visit (INDEPENDENT_AMBULATORY_CARE_PROVIDER_SITE_OTHER): Payer: Medicare HMO

## 2020-02-15 DIAGNOSIS — I63513 Cerebral infarction due to unspecified occlusion or stenosis of bilateral middle cerebral arteries: Secondary | ICD-10-CM | POA: Diagnosis not present

## 2020-02-16 DIAGNOSIS — H3561 Retinal hemorrhage, right eye: Secondary | ICD-10-CM | POA: Diagnosis not present

## 2020-02-19 NOTE — Progress Notes (Signed)
Carelink Summary Report / Loop Recorder 

## 2020-03-21 ENCOUNTER — Ambulatory Visit (INDEPENDENT_AMBULATORY_CARE_PROVIDER_SITE_OTHER): Payer: Medicare HMO

## 2020-03-21 DIAGNOSIS — I63513 Cerebral infarction due to unspecified occlusion or stenosis of bilateral middle cerebral arteries: Secondary | ICD-10-CM | POA: Diagnosis not present

## 2020-03-21 LAB — CUP PACEART REMOTE DEVICE CHECK
Date Time Interrogation Session: 20211121233539
Implantable Pulse Generator Implant Date: 20180604

## 2020-03-22 NOTE — Progress Notes (Signed)
Carelink Summary Report / Loop Recorder 

## 2020-03-28 DIAGNOSIS — M5412 Radiculopathy, cervical region: Secondary | ICD-10-CM | POA: Diagnosis not present

## 2020-03-28 DIAGNOSIS — I69153 Hemiplegia and hemiparesis following nontraumatic intracerebral hemorrhage affecting right non-dominant side: Secondary | ICD-10-CM | POA: Diagnosis not present

## 2020-03-28 DIAGNOSIS — G894 Chronic pain syndrome: Secondary | ICD-10-CM | POA: Diagnosis not present

## 2020-03-28 DIAGNOSIS — F1120 Opioid dependence, uncomplicated: Secondary | ICD-10-CM | POA: Diagnosis not present

## 2020-03-28 DIAGNOSIS — I679 Cerebrovascular disease, unspecified: Secondary | ICD-10-CM | POA: Diagnosis not present

## 2020-03-28 DIAGNOSIS — I10 Essential (primary) hypertension: Secondary | ICD-10-CM | POA: Diagnosis not present

## 2020-03-28 DIAGNOSIS — M542 Cervicalgia: Secondary | ICD-10-CM | POA: Diagnosis not present

## 2020-04-07 DIAGNOSIS — F1120 Opioid dependence, uncomplicated: Secondary | ICD-10-CM | POA: Diagnosis not present

## 2020-04-07 DIAGNOSIS — Z79891 Long term (current) use of opiate analgesic: Secondary | ICD-10-CM | POA: Diagnosis not present

## 2020-04-24 LAB — CUP PACEART REMOTE DEVICE CHECK
Date Time Interrogation Session: 20211224233712
Implantable Pulse Generator Implant Date: 20180604

## 2020-04-25 ENCOUNTER — Ambulatory Visit (INDEPENDENT_AMBULATORY_CARE_PROVIDER_SITE_OTHER): Payer: Medicare HMO

## 2020-04-25 DIAGNOSIS — I63513 Cerebral infarction due to unspecified occlusion or stenosis of bilateral middle cerebral arteries: Secondary | ICD-10-CM

## 2020-05-04 DIAGNOSIS — I69153 Hemiplegia and hemiparesis following nontraumatic intracerebral hemorrhage affecting right non-dominant side: Secondary | ICD-10-CM | POA: Diagnosis not present

## 2020-05-04 DIAGNOSIS — I679 Cerebrovascular disease, unspecified: Secondary | ICD-10-CM | POA: Diagnosis not present

## 2020-05-04 DIAGNOSIS — M542 Cervicalgia: Secondary | ICD-10-CM | POA: Diagnosis not present

## 2020-05-04 DIAGNOSIS — M5412 Radiculopathy, cervical region: Secondary | ICD-10-CM | POA: Diagnosis not present

## 2020-05-04 DIAGNOSIS — F1120 Opioid dependence, uncomplicated: Secondary | ICD-10-CM | POA: Diagnosis not present

## 2020-05-04 DIAGNOSIS — I10 Essential (primary) hypertension: Secondary | ICD-10-CM | POA: Diagnosis not present

## 2020-05-04 DIAGNOSIS — G894 Chronic pain syndrome: Secondary | ICD-10-CM | POA: Diagnosis not present

## 2020-05-09 NOTE — Progress Notes (Signed)
Carelink Summary Report / Loop Recorder 

## 2020-05-23 DIAGNOSIS — Z0000 Encounter for general adult medical examination without abnormal findings: Secondary | ICD-10-CM | POA: Diagnosis not present

## 2020-05-23 DIAGNOSIS — E669 Obesity, unspecified: Secondary | ICD-10-CM | POA: Diagnosis not present

## 2020-05-23 DIAGNOSIS — Z9181 History of falling: Secondary | ICD-10-CM | POA: Diagnosis not present

## 2020-05-23 DIAGNOSIS — E785 Hyperlipidemia, unspecified: Secondary | ICD-10-CM | POA: Diagnosis not present

## 2020-05-23 DIAGNOSIS — Z1331 Encounter for screening for depression: Secondary | ICD-10-CM | POA: Diagnosis not present

## 2020-05-24 DIAGNOSIS — H3561 Retinal hemorrhage, right eye: Secondary | ICD-10-CM | POA: Diagnosis not present

## 2020-05-25 DIAGNOSIS — J449 Chronic obstructive pulmonary disease, unspecified: Secondary | ICD-10-CM | POA: Diagnosis not present

## 2020-05-25 DIAGNOSIS — I6990 Unspecified sequelae of unspecified cerebrovascular disease: Secondary | ICD-10-CM | POA: Diagnosis not present

## 2020-05-25 DIAGNOSIS — E559 Vitamin D deficiency, unspecified: Secondary | ICD-10-CM | POA: Diagnosis not present

## 2020-05-25 DIAGNOSIS — R7301 Impaired fasting glucose: Secondary | ICD-10-CM | POA: Diagnosis not present

## 2020-05-25 DIAGNOSIS — E785 Hyperlipidemia, unspecified: Secondary | ICD-10-CM | POA: Diagnosis not present

## 2020-05-25 DIAGNOSIS — Z1231 Encounter for screening mammogram for malignant neoplasm of breast: Secondary | ICD-10-CM | POA: Diagnosis not present

## 2020-05-25 DIAGNOSIS — I10 Essential (primary) hypertension: Secondary | ICD-10-CM | POA: Diagnosis not present

## 2020-05-28 LAB — CUP PACEART REMOTE DEVICE CHECK
Date Time Interrogation Session: 20220126234222
Implantable Pulse Generator Implant Date: 20180604

## 2020-05-30 ENCOUNTER — Ambulatory Visit (INDEPENDENT_AMBULATORY_CARE_PROVIDER_SITE_OTHER): Payer: Medicare HMO

## 2020-05-30 DIAGNOSIS — I63513 Cerebral infarction due to unspecified occlusion or stenosis of bilateral middle cerebral arteries: Secondary | ICD-10-CM

## 2020-06-07 NOTE — Progress Notes (Signed)
Carelink Summary Report / Loop Recorder 

## 2020-06-08 ENCOUNTER — Telehealth: Payer: Self-pay

## 2020-06-08 NOTE — Telephone Encounter (Signed)
Returned patients phone call. Discussed options or explant or leave in. States she would like to have device explanted. Return kit will be sent to house. Questions answered. Advised patient to call back with further questions or concerns.   Routing to scheduler to make apt for explant.

## 2020-06-08 NOTE — Telephone Encounter (Signed)
ILR has met RRT as of 06/07/20. Called to dicussed options to leave in or explant. No answer, LMOVM.

## 2020-06-08 NOTE — Telephone Encounter (Signed)
Pt called back in returning call to [facility]

## 2020-06-09 DIAGNOSIS — Z79891 Long term (current) use of opiate analgesic: Secondary | ICD-10-CM | POA: Diagnosis not present

## 2020-06-09 DIAGNOSIS — F1120 Opioid dependence, uncomplicated: Secondary | ICD-10-CM | POA: Diagnosis not present

## 2020-06-29 DIAGNOSIS — M5412 Radiculopathy, cervical region: Secondary | ICD-10-CM | POA: Diagnosis not present

## 2020-06-29 DIAGNOSIS — I679 Cerebrovascular disease, unspecified: Secondary | ICD-10-CM | POA: Diagnosis not present

## 2020-06-29 DIAGNOSIS — G894 Chronic pain syndrome: Secondary | ICD-10-CM | POA: Diagnosis not present

## 2020-06-29 DIAGNOSIS — I69153 Hemiplegia and hemiparesis following nontraumatic intracerebral hemorrhage affecting right non-dominant side: Secondary | ICD-10-CM | POA: Diagnosis not present

## 2020-06-29 DIAGNOSIS — F1120 Opioid dependence, uncomplicated: Secondary | ICD-10-CM | POA: Diagnosis not present

## 2020-06-29 DIAGNOSIS — I10 Essential (primary) hypertension: Secondary | ICD-10-CM | POA: Diagnosis not present

## 2020-06-29 DIAGNOSIS — M542 Cervicalgia: Secondary | ICD-10-CM | POA: Diagnosis not present

## 2020-07-18 DIAGNOSIS — Z86010 Personal history of colonic polyps: Secondary | ICD-10-CM | POA: Diagnosis not present

## 2020-08-03 DIAGNOSIS — J4541 Moderate persistent asthma with (acute) exacerbation: Secondary | ICD-10-CM | POA: Diagnosis not present

## 2020-08-19 DIAGNOSIS — Z7951 Long term (current) use of inhaled steroids: Secondary | ICD-10-CM | POA: Diagnosis not present

## 2020-08-19 DIAGNOSIS — Z86010 Personal history of colonic polyps: Secondary | ICD-10-CM | POA: Diagnosis not present

## 2020-08-19 DIAGNOSIS — K5730 Diverticulosis of large intestine without perforation or abscess without bleeding: Secondary | ICD-10-CM | POA: Diagnosis not present

## 2020-08-19 DIAGNOSIS — J45909 Unspecified asthma, uncomplicated: Secondary | ICD-10-CM | POA: Diagnosis not present

## 2020-08-19 DIAGNOSIS — I10 Essential (primary) hypertension: Secondary | ICD-10-CM | POA: Diagnosis not present

## 2020-08-19 DIAGNOSIS — Z7902 Long term (current) use of antithrombotics/antiplatelets: Secondary | ICD-10-CM | POA: Diagnosis not present

## 2020-08-19 DIAGNOSIS — D122 Benign neoplasm of ascending colon: Secondary | ICD-10-CM | POA: Diagnosis not present

## 2020-08-19 DIAGNOSIS — Z09 Encounter for follow-up examination after completed treatment for conditions other than malignant neoplasm: Secondary | ICD-10-CM | POA: Diagnosis not present

## 2020-08-19 DIAGNOSIS — D126 Benign neoplasm of colon, unspecified: Secondary | ICD-10-CM | POA: Diagnosis not present

## 2020-08-19 DIAGNOSIS — Z8673 Personal history of transient ischemic attack (TIA), and cerebral infarction without residual deficits: Secondary | ICD-10-CM | POA: Diagnosis not present

## 2020-08-19 DIAGNOSIS — F32A Depression, unspecified: Secondary | ICD-10-CM | POA: Diagnosis not present

## 2020-08-24 DIAGNOSIS — Z79891 Long term (current) use of opiate analgesic: Secondary | ICD-10-CM | POA: Diagnosis not present

## 2020-08-24 DIAGNOSIS — F1120 Opioid dependence, uncomplicated: Secondary | ICD-10-CM | POA: Diagnosis not present

## 2020-08-25 DIAGNOSIS — Z9889 Other specified postprocedural states: Secondary | ICD-10-CM | POA: Insufficient documentation

## 2020-08-25 HISTORY — DX: Other specified postprocedural states: Z98.890

## 2020-08-31 DIAGNOSIS — I679 Cerebrovascular disease, unspecified: Secondary | ICD-10-CM | POA: Diagnosis not present

## 2020-08-31 DIAGNOSIS — M5412 Radiculopathy, cervical region: Secondary | ICD-10-CM | POA: Diagnosis not present

## 2020-08-31 DIAGNOSIS — G8929 Other chronic pain: Secondary | ICD-10-CM | POA: Diagnosis not present

## 2020-08-31 DIAGNOSIS — I69153 Hemiplegia and hemiparesis following nontraumatic intracerebral hemorrhage affecting right non-dominant side: Secondary | ICD-10-CM | POA: Diagnosis not present

## 2020-08-31 DIAGNOSIS — I10 Essential (primary) hypertension: Secondary | ICD-10-CM | POA: Diagnosis not present

## 2020-08-31 DIAGNOSIS — M542 Cervicalgia: Secondary | ICD-10-CM | POA: Diagnosis not present

## 2020-08-31 DIAGNOSIS — G894 Chronic pain syndrome: Secondary | ICD-10-CM | POA: Diagnosis not present

## 2020-08-31 DIAGNOSIS — F1120 Opioid dependence, uncomplicated: Secondary | ICD-10-CM | POA: Diagnosis not present

## 2020-09-01 DIAGNOSIS — G8191 Hemiplegia, unspecified affecting right dominant side: Secondary | ICD-10-CM | POA: Diagnosis not present

## 2020-09-01 DIAGNOSIS — R7301 Impaired fasting glucose: Secondary | ICD-10-CM | POA: Diagnosis not present

## 2020-09-01 DIAGNOSIS — J449 Chronic obstructive pulmonary disease, unspecified: Secondary | ICD-10-CM | POA: Diagnosis not present

## 2020-09-01 DIAGNOSIS — Z1231 Encounter for screening mammogram for malignant neoplasm of breast: Secondary | ICD-10-CM | POA: Diagnosis not present

## 2020-09-01 DIAGNOSIS — E785 Hyperlipidemia, unspecified: Secondary | ICD-10-CM | POA: Diagnosis not present

## 2020-09-01 DIAGNOSIS — I10 Essential (primary) hypertension: Secondary | ICD-10-CM | POA: Diagnosis not present

## 2020-09-07 ENCOUNTER — Ambulatory Visit: Payer: Medicare HMO | Admitting: Internal Medicine

## 2020-09-07 ENCOUNTER — Ambulatory Visit (HOSPITAL_COMMUNITY)
Admission: RE | Admit: 2020-09-07 | Discharge: 2020-09-07 | Disposition: A | Discharge status: Home / Self Care | Payer: Medicare HMO | Source: Ambulatory Visit | Attending: Internal Medicine | Admitting: Internal Medicine

## 2020-09-07 ENCOUNTER — Encounter (HOSPITAL_COMMUNITY)
Admission: RE | Disposition: A | Discharge status: Home / Self Care | Payer: Self-pay | Source: Ambulatory Visit | Attending: Internal Medicine

## 2020-09-07 ENCOUNTER — Encounter: Payer: Self-pay | Admitting: Internal Medicine

## 2020-09-07 ENCOUNTER — Encounter (INDEPENDENT_AMBULATORY_CARE_PROVIDER_SITE_OTHER): Payer: Self-pay

## 2020-09-07 ENCOUNTER — Other Ambulatory Visit: Payer: Self-pay | Admitting: Internal Medicine

## 2020-09-07 ENCOUNTER — Other Ambulatory Visit: Payer: Self-pay

## 2020-09-07 VITALS — BP 156/72 | HR 68 | Ht <= 58 in | Wt 172.8 lb

## 2020-09-07 DIAGNOSIS — I10 Essential (primary) hypertension: Secondary | ICD-10-CM | POA: Insufficient documentation

## 2020-09-07 DIAGNOSIS — Z8673 Personal history of transient ischemic attack (TIA), and cerebral infarction without residual deficits: Secondary | ICD-10-CM | POA: Insufficient documentation

## 2020-09-07 DIAGNOSIS — Z7902 Long term (current) use of antithrombotics/antiplatelets: Secondary | ICD-10-CM | POA: Insufficient documentation

## 2020-09-07 DIAGNOSIS — I639 Cerebral infarction, unspecified: Secondary | ICD-10-CM | POA: Diagnosis not present

## 2020-09-07 DIAGNOSIS — Z9889 Other specified postprocedural states: Secondary | ICD-10-CM | POA: Diagnosis not present

## 2020-09-07 DIAGNOSIS — Z79899 Other long term (current) drug therapy: Secondary | ICD-10-CM | POA: Diagnosis not present

## 2020-09-07 DIAGNOSIS — Z888 Allergy status to other drugs, medicaments and biological substances status: Secondary | ICD-10-CM | POA: Insufficient documentation

## 2020-09-07 HISTORY — PX: LOOP RECORDER REMOVAL: EP1215

## 2020-09-07 SURGERY — LOOP RECORDER REMOVAL
Anesthesia: LOCAL

## 2020-09-07 MED ORDER — LIDOCAINE HCL (PF) 1 % IJ SOLN: INTRAMUSCULAR | Status: DC | PRN

## 2020-09-07 MED ORDER — LIDOCAINE-EPINEPHRINE 1 %-1:100000 IJ SOLN
INTRAMUSCULAR | Status: DC | PRN
  Administered 2020-09-07: 5 mL

## 2020-09-07 MED ORDER — LIDOCAINE-EPINEPHRINE 1 %-1:100000 IJ SOLN
INTRAMUSCULAR | Status: AC
  Filled 2020-09-07: qty 1

## 2020-09-07 SURGICAL SUPPLY — 1 items: PACK LOOP INSERTION (CUSTOM PROCEDURE TRAY) ×2 IMPLANT

## 2020-09-07 NOTE — Patient Instructions (Addendum)
Medication Instructions:  Your physician recommends that you continue on your current medications as directed. Please refer to the Current Medication list given to you today.  *If you need a refill on your cardiac medications before your next appointment, please call your pharmacy*   Lab Work: None ordered.  If you have labs (blood work) drawn today and your tests are completely normal, you will receive your results only by: Marland Kitchen MyChart Message (if you have MyChart) OR . A paper copy in the mail If you have any lab test that is abnormal or we need to change your treatment, we will call you to review the results.   Testing/Procedures: You will have your loop recorder removed at [facility].  Please report to the [facility], Main Entrance A, near [place] parking at [place] to the registration desk on the left.   Follow-Up: At [facility], you and your health needs are our priority.  As part of our continuing mission to provide you with exceptional heart care, we have created designated Provider Care Teams.  These Care Teams include your primary Cardiologist (physician) and Advanced Practice Providers (APPs -  Physician Assistants and Nurse Practitioners) who all work together to provide you with the care you need, when you need it.  We recommend signing up for the patient portal called "MyChart".  Sign up information is provided on this After Visit Summary.  MyChart is used to connect with patients for Virtual Visits (Telemedicine).  Patients are able to view lab/test results, encounter notes, upcoming appointments, etc.  Non-urgent messages can be sent to your provider as well.   To learn more about what you can do with MyChart, go to [email or web address].    Your next appointment:   Follow up with Dr Caryl Comes as needed.

## 2020-09-07 NOTE — Progress Notes (Signed)
ELECTROPHYSIOLOGY OFFICE NOTE  Patient ID: Lynn Rollins, MRN: [id number], DOB/AGE: 55-Apr-1967 55 y.o. Admit date: (Not on file) Date of Consult: 09/07/2020  Primary Physician: Nicoletta Dress, MD Primary Cardiologist:N/A*     Lynn Rollins is a 55 y.o. female who is being seen today for removal of a previously implanted loop recorder  HPI Lynn Rollins is a 55 y.o. female with a history of a cryptogenic stroke 2018 involving the left ACA thought to be embolic.  She underwent loop recorder insertion 6/18  Some residual vestibular dysfunction but denies chest pain shortness of breath peripheral edema or nocturnal dyspnea.  No tachy palpitations  She is here today to have her loop recorder removed  DATE TEST EF   6//18 Echo   60-65 %              Past Medical History:  Diagnosis Date  . Acute ischemic left ACA stroke ([place])   . Anxiety   . Asthma   . Brain lesion 2011-2012   MRI  . Chronic fatigue   . Chronic pain syndrome   . COPD (chronic obstructive pulmonary disease) ([place])   . Degeneration of C5-C6 intervertebral disc   . Depression   . Eustachian tube dysfunction   . Fibromyalgia   . H. pylori infection   . Hearing loss   . Hemorrhoid   . Hiatal hernia with GERD   . Hyperlipemia   . Hypertension   . IBS (irritable bowel syndrome)   . Migraine   . OSA on CPAP   . Scalp psoriasis   . Stroke ([place])   . Vitamin D deficiency       Surgical History:  Past Surgical History:  Procedure Laterality Date  . ABDOMINAL HYSTERECTOMY    . CHOLECYSTECTOMY    . COLONOSCOPY  04/28/2015   Mild sigmoid diverticulosis. Otherwise normal colonoscopy  . ESOPHAGOGASTRODUODENOSCOPY  09/11/2013   Mild gastritis. Otherwise normal esophagogastroduodenoscopy  . LOOP RECORDER INSERTION N/A 10/01/2016   Procedure: Loop Recorder Insertion;  Surgeon: Deboraha Sprang, MD;  Location: [place] CV LAB;  Service: Cardiovascular;  Laterality: N/A;  . OTHER SURGICAL  HISTORY     screw in head. Bone anch [place] hearing aid  . TEE WITHOUT CARDIOVERSION N/A 10/01/2016   Procedure: TRANSESOPHAGEAL ECHOCARDIOGRAM (TEE);  Surgeon: Acie Fredrickson, Wonda Cheng, MD;  Location: [facility] ENDOSCOPY;  Service: Cardiovascular;  Laterality: N/A;  . TUBAL LIGATION       Home Meds: Current Meds  Medication Sig  . albuterol (PROVENTIL HFA;VENTOLIN HFA) 108 (90 BASE) MCG/ACT inhaler Inhale 2 puffs into the lungs every 4 (four) hours as needed for wheezing.   Marland Kitchen BREO ELLIPTA 200-25 MCG/INH AEPB Inhale 1 puff into the lungs as needed.   . cloNIDine (CATAPRES) 0.1 MG tablet Take 0.1 mg by mouth 3 (three) times daily.  . clopidogrel (PLAVIX) 75 MG tablet Take 75 mg by mouth daily.  Marland Kitchen DEXILANT 60 MG capsule Take 60 mg by mouth daily.  . diazepam (VALIUM) 5 MG tablet Take 0.5 tablets (2.5 mg total) by mouth 3 (three) times daily as needed for anxiety.  . dicyclomine (BENTYL) 20 MG tablet Take 20 mg by mouth every 6 (six) hours as needed for spasms.   . famotidine (PEPCID) 40 MG tablet 1 tablet daily.  Marland Kitchen gabapentin (NEURONTIN) 300 MG capsule Take 300 mg by mouth at bedtime.  Marland Kitchen ipratropium-albuterol (DUONEB) 0.5-2.5 (3) MG/3ML SOLN Inhale 3 mLs into the lungs every  6 (six) hours as needed (for shortness of breath or wheezing).   Marland Kitchen lisinopril (PRINIVIL,ZESTRIL) 40 MG tablet Take 40 mg by mouth daily.  . Menthol-Camphor (TIGER BALM ARTHRITIS RUB EX) Apply 1 application topically daily as needed (for back pain).  . mupirocin ointment (BACTROBAN) 2 % Apply 1 application topically as needed.   . ondansetron (ZOFRAN ODT) 4 MG disintegrating tablet Take 1 tablet (4 mg total) by mouth every 8 (eight) hours as needed for nausea or vomiting.  . OXYCONTIN 10 MG 12 hr tablet Take 10 mg by mouth QID.   Marland Kitchen SPIRIVA RESPIMAT 1.25 MCG/ACT AERS Inhale 1 puff into the lungs as needed.   Manus Gunning BOWEL PREP KIT 17.5-3.13-1.6 GM/177ML SOLN Take 1 kit by mouth as directed.  . temazepam (RESTORIL) 30 MG capsule Take 1  capsule (30 mg total) by mouth at bedtime.  . Vitamin D, Ergocalciferol, (DRISDOL) 50000 units CAPS capsule Take 50,000 Units by mouth every Saturday.     Allergies:  Allergies  Allergen Reactions  . Celexa [Citalopram Hydrobromide] Other (See Comments)    "Made me cry and made me worse"  . Tape Rash and Other (See Comments)    Patient has Fibromyalgia and cannot tolerate medical tape and it's removal  . Broccoli [Brassica Oleracea]   . Statins Other (See Comments)    Muscles cramp    Social History   Socioeconomic History  . Marital status: Married    Spouse name: Not on file  . Number of children: Not on file  . Years of education: Not on file  . Highest education level: Not on file  Occupational History  . Not on file  Tobacco Use  . Smoking status: Never Smoker  . Smokeless tobacco: Never Used  Vaping Use  . Vaping Use: Never used  Substance and Sexual Activity  . Alcohol use: No  . Drug use: No  . Sexual activity: Not on file  Other Topics Concern  . Not on file  Social History Narrative   Lives at home with Antony Haste, husband.  She is disabled.  Education graduate.  2 children.  Caffeine 2 drinks per day.  (most).   Social Determinants of Health   Financial Resource Strain: Not on file  Food Insecurity: Not on file  Transportation Needs: Not on file  Physical Activity: Not on file  Stress: Not on file  Social Connections: Not on file  Intimate Partner Violence: Not on file     Family History  Problem Relation Age of Onset  . Stroke Mother   . Parkinson's disease Father   . Heart disease Father   . Stroke Father   . Other Father        esophageal stricture  . Multiple sclerosis Brother   . Esophageal cancer Neg Hx      ROS:  Please see the history of present illness.     All other systems reviewed and negative.    Physical Exam: Blood pressure (!) 156/72, pulse 68, height _0  (1.473 m), weight 172 lb 12.8 oz (78.4 kg), SpO2 98 %. General: Well  developed, well nourished female in no acute distress. Head: Normocephalic, atraumatic, sclera non-icteric, no xanthomas, nares are without discharge. EENT: normal  Lymph Nodes:  none Neck: Negative for carotid bruits. JVD not elevated. Back:without scoliosis kyphosis Lungs: Clear bilaterally to auscultation without wheezes, rales, or rhonchi. Breathing is unlabored. Heart: RRR with S1 S2. No  murmur . No rubs, or gallops appreciated. Abdomen: Soft,  non-tender, non-distended with normoactive bowel sounds. No hepatomegaly. No rebound/guarding. No obvious abdominal masses. Msk:  Strength and tone appear normal for age. Extremities: No clubbing or cyanosis. No  edema.  Distal pedal pulses are 2+ and equal bilaterally. Skin: Warm and Dry Neuro: Alert and oriented X 3. CN III-XII intact Grossly normal sensory and motor function . Psych:  Responds to questions appropriately with a normal affect.      Labs: Cardiac Enzymes No results for input(s): CKTOTAL, CKMB, TROPONINI in the last 72 hours. CBC Lab Results  Component Value Date   WBC 13.9 (H) 08/13/2017   HGB 14.7 08/13/2017   HCT 43.0 08/13/2017   MCV 87.2 08/13/2017   PLT 297 08/13/2017   PROTIME: No results for input(s): LABPROT, INR in the last 72 hours. Chemistry No results for input(s): NA, K, CL, CO2, BUN, CREATININE, CALCIUM, PROT, BILITOT, ALKPHOS, ALT, AST, GLUCOSE in the last 168 hours.  Invalid input(s): LABALBU Lipids Lab Results  Component Value Date   CHOL 124 08/09/2017   HDL 31 (L) 08/09/2017   LDLCALC 52 08/09/2017   TRIG 203 (H) 08/09/2017   BNP No results found for: PROBNP Thyroid Function Tests: No results for input(s): TSH, T4TOTAL, T3FREE, THYROIDAB in the last 72 hours.  Invalid input(s): FREET3 Miscellaneous No results found for: DDIMER  Radiology/Studies:  No results found.  EKG:     Assessment and Plan:  Cryptogenic Stroke   Loop recorder -EOS   Hypertension -- white  coat    Lynn Rollins [id number]  [id number]  Preop Dx: lmplantable loop recorder at EOS Postop Dx same/  Procedure: Implantable loop recorder explant-aborted  Cx: Inability to ascertain the location of the device  The patient's loop recorder was difficult to feel.  We then undertook an ultrasound to see if we did find reverberation artifact and thinking we had again thought to palpate the device.  I then thought I had palpated the device about half a centimeter cephalad to the incision, it was quite certain however, following numbing and a small incision I could not feel it.  At this juncture I decided to abandon this approach and will locate the device fluoroscopically.     Virl Axe, MD 09/07/2020 9:18 AM      Virl Axe

## 2020-09-07 NOTE — Discharge Instructions (Signed)
Loop Electrosurgical Excision Procedure Loop electrosurgical excision procedure (LEEP) is the cutting and removal (excision) of tissue from the cervix. The cervix is the bottom part of the uterus that opens into the vagina. The tissue that is removed from the cervix is examined to see if there are precancerous cells or cancer cells present. LEEP may be done when:  You have abnormal bleeding from your cervix.  You have an abnormal Pap test result.  Your health care provider finds an abnormality on your cervix during a pelvic exam. LEEP typically only takes a few minutes and is often done in the health care provider's office. The procedure is safe for women who are trying to get pregnant. However, the procedure is usually not done during a menstrual period or during pregnancy. Tell a health care provider about:  Any allergies you have.  All medicines you are taking, including vitamins, herbs, eye drops, creams, and over-the-counter medicines.  Any blood disorders you have.  Any medical conditions you have, including current or past vaginal infections such as herpes or sexually-transmitted infections (STIs).  Whether you are pregnant or may be pregnant.  Whether or not you are having vaginal bleeding on the day of the procedure. What are the risks? Generally, this is a safe procedure. However, problems may occur, including:  Infection.  Bleeding.  Allergic reactions to medicines.  Changes or scarring in the cervix.  Increased risk of early (preterm) labor in future pregnancies. What happens before the procedure?  Ask your health care provider about: ? Changing or stopping your regular medicines. This is especially important if you are taking diabetes medicines or blood thinners. ? Taking medicines such as aspirin and ibuprofen. These medicines can thin your blood. Do not take these medicines unless your health care provider tells you to take them. ? Taking over-the-counter  medicines, vitamins, herbs, and supplements.  Your health care provider may recommend that you take pain medicine before the procedure.  Ask your health care provider if you should plan to have someone take you home after the procedure. What happens during the procedure?  An instrument called a speculum will be placed in your vagina. This will allow your health care provider to see your cervix.  You will be given a medicine to numb the area (local anesthetic). The medicine will be injected into your cervix and the surrounding area.  A solution will be applied to your cervix. This solution will help the health care provider find the abnormal cells that need to be removed.  A thin wire loop will be passed through your vagina. The wire will be used to burn (cauterize) the cervical tissue with an electrical current.  You may feel faint during the procedure. Tell your health care provider right away if you feel this way.  The abnormal cervical tissue will be removed.  Any open blood vessels will be cauterized to prevent bleeding.  A paste may be applied to the cauterized area of your cervix to help prevent bleeding.  The sample of cervical tissue will be examined under a microscope. The procedure may vary among health care providers and hospitals.   What can I expect after the procedure? After the procedure, it is common to have:  Mild abdominal cramps that are similar to menstrual cramps. These may last for up to 1 week.  A small amount of pink-tinged or bloody vaginal discharge, including light to moderate bleeding, for 1-2 weeks.  A dark-colored discharge coming from your vagina. This is  from the paste that was used on the cervix to prevent bleeding. It is up to you to get the results of your procedure. Ask your health care provider, or the department that is doing the procedure, when your results will be ready. Follow these instructions at home:  Take over-the-counter and  prescription medicines only as told by your health care provider.  Return to your normal activities as told by your health care provider. Ask your health care provider what activities are safe for you.  Do not put anything in your vagina for 2 weeks after the procedure or until your health care provider says that it is okay. This includes tampons, creams, and douches.  Do not have sex until your health care provider approves.  Keep all follow-up visits as told by your health care provider. This is important. Contact a health care provider if you:  Have a fever or chills.  Feel unusually weak.  Have vaginal bleeding that is heavier or longer than a normal menstrual cycle. A sign of this can be soaking a pad with blood or bleeding with clots.  Develop a bad smelling vaginal discharge.  Have severe abdominal pain or cramping. Summary  Loop electrosurgical excision procedure (LEEP) is the removal of tissue from the cervix. The removed tissue will be checked for precancerous cells or cancer cells.  LEEP typically only takes a few minutes and is often done in the health care provider's office.  Do not put anything in your vagina for 2 weeks after the procedure or until your health care provider says that it is okay. This includes tampons, creams, and douches.  Keep all follow-up visits as told by your health care provider. Ask your health care provider, or the department that is doing the procedure, when your results will be ready. This information is not intended to replace advice given to you by your health care provider. Make sure you discuss any questions you have with your health care provider. Document Revised: 05/09/2018 Document Reviewed: 05/09/2018 Elsevier Patient Education  2021 [employer].

## 2020-09-07 NOTE — Progress Notes (Signed)
Patient and husband given discharge instructions per MD order.  Patient able to verbalize understanding.

## 2020-09-08 ENCOUNTER — Encounter (HOSPITAL_COMMUNITY): Payer: Self-pay | Admitting: Internal Medicine

## 2020-09-09 NOTE — H&P (Signed)
See office note

## 2020-10-04 DIAGNOSIS — Z79891 Long term (current) use of opiate analgesic: Secondary | ICD-10-CM | POA: Diagnosis not present

## 2020-10-04 DIAGNOSIS — F1120 Opioid dependence, uncomplicated: Secondary | ICD-10-CM | POA: Diagnosis not present

## 2020-10-26 DIAGNOSIS — G8929 Other chronic pain: Secondary | ICD-10-CM | POA: Diagnosis not present

## 2020-10-26 DIAGNOSIS — I10 Essential (primary) hypertension: Secondary | ICD-10-CM | POA: Diagnosis not present

## 2020-10-26 DIAGNOSIS — G894 Chronic pain syndrome: Secondary | ICD-10-CM | POA: Diagnosis not present

## 2020-10-26 DIAGNOSIS — F1120 Opioid dependence, uncomplicated: Secondary | ICD-10-CM | POA: Diagnosis not present

## 2020-10-26 DIAGNOSIS — M5412 Radiculopathy, cervical region: Secondary | ICD-10-CM | POA: Diagnosis not present

## 2020-10-26 DIAGNOSIS — I679 Cerebrovascular disease, unspecified: Secondary | ICD-10-CM | POA: Diagnosis not present

## 2020-10-26 DIAGNOSIS — M542 Cervicalgia: Secondary | ICD-10-CM | POA: Diagnosis not present

## 2020-10-26 DIAGNOSIS — I69153 Hemiplegia and hemiparesis following nontraumatic intracerebral hemorrhage affecting right non-dominant side: Secondary | ICD-10-CM | POA: Diagnosis not present

## 2020-12-02 DIAGNOSIS — R5382 Chronic fatigue, unspecified: Secondary | ICD-10-CM | POA: Diagnosis not present

## 2020-12-02 DIAGNOSIS — G894 Chronic pain syndrome: Secondary | ICD-10-CM | POA: Diagnosis not present

## 2020-12-02 DIAGNOSIS — L409 Psoriasis, unspecified: Secondary | ICD-10-CM | POA: Diagnosis not present

## 2020-12-02 DIAGNOSIS — E785 Hyperlipidemia, unspecified: Secondary | ICD-10-CM | POA: Diagnosis not present

## 2020-12-02 DIAGNOSIS — G8191 Hemiplegia, unspecified affecting right dominant side: Secondary | ICD-10-CM | POA: Diagnosis not present

## 2020-12-02 DIAGNOSIS — J449 Chronic obstructive pulmonary disease, unspecified: Secondary | ICD-10-CM | POA: Diagnosis not present

## 2020-12-02 DIAGNOSIS — R7301 Impaired fasting glucose: Secondary | ICD-10-CM | POA: Diagnosis not present

## 2020-12-02 DIAGNOSIS — Z1231 Encounter for screening mammogram for malignant neoplasm of breast: Secondary | ICD-10-CM | POA: Diagnosis not present

## 2020-12-02 DIAGNOSIS — I10 Essential (primary) hypertension: Secondary | ICD-10-CM | POA: Diagnosis not present

## 2020-12-07 DIAGNOSIS — H9042 Sensorineural hearing loss, unilateral, left ear, with unrestricted hearing on the contralateral side: Secondary | ICD-10-CM | POA: Diagnosis not present

## 2020-12-21 DIAGNOSIS — Z79899 Other long term (current) drug therapy: Secondary | ICD-10-CM | POA: Diagnosis not present

## 2020-12-22 DIAGNOSIS — H5213 Myopia, bilateral: Secondary | ICD-10-CM | POA: Diagnosis not present

## 2020-12-28 DIAGNOSIS — G894 Chronic pain syndrome: Secondary | ICD-10-CM | POA: Diagnosis not present

## 2020-12-28 DIAGNOSIS — M542 Cervicalgia: Secondary | ICD-10-CM | POA: Diagnosis not present

## 2020-12-28 DIAGNOSIS — I10 Essential (primary) hypertension: Secondary | ICD-10-CM | POA: Diagnosis not present

## 2020-12-28 DIAGNOSIS — M5412 Radiculopathy, cervical region: Secondary | ICD-10-CM | POA: Diagnosis not present

## 2021-01-25 DIAGNOSIS — L299 Pruritus, unspecified: Secondary | ICD-10-CM | POA: Diagnosis not present

## 2021-01-25 DIAGNOSIS — L400 Psoriasis vulgaris: Secondary | ICD-10-CM | POA: Diagnosis not present

## 2021-02-15 DIAGNOSIS — R52 Pain, unspecified: Secondary | ICD-10-CM | POA: Diagnosis not present

## 2021-02-15 DIAGNOSIS — M545 Low back pain, unspecified: Secondary | ICD-10-CM | POA: Diagnosis not present

## 2021-02-17 DIAGNOSIS — H9042 Sensorineural hearing loss, unilateral, left ear, with unrestricted hearing on the contralateral side: Secondary | ICD-10-CM | POA: Diagnosis not present

## 2021-02-20 DIAGNOSIS — G894 Chronic pain syndrome: Secondary | ICD-10-CM | POA: Diagnosis not present

## 2021-02-24 DIAGNOSIS — L299 Pruritus, unspecified: Secondary | ICD-10-CM | POA: Diagnosis not present

## 2021-02-24 DIAGNOSIS — L820 Inflamed seborrheic keratosis: Secondary | ICD-10-CM | POA: Diagnosis not present

## 2021-02-24 DIAGNOSIS — L400 Psoriasis vulgaris: Secondary | ICD-10-CM | POA: Diagnosis not present

## 2021-03-07 DIAGNOSIS — G894 Chronic pain syndrome: Secondary | ICD-10-CM | POA: Diagnosis not present

## 2021-03-07 DIAGNOSIS — I10 Essential (primary) hypertension: Secondary | ICD-10-CM | POA: Diagnosis not present

## 2021-03-07 DIAGNOSIS — E785 Hyperlipidemia, unspecified: Secondary | ICD-10-CM | POA: Diagnosis not present

## 2021-03-07 DIAGNOSIS — R7301 Impaired fasting glucose: Secondary | ICD-10-CM | POA: Diagnosis not present

## 2021-03-07 DIAGNOSIS — Z1231 Encounter for screening mammogram for malignant neoplasm of breast: Secondary | ICD-10-CM | POA: Diagnosis not present

## 2021-03-07 DIAGNOSIS — J449 Chronic obstructive pulmonary disease, unspecified: Secondary | ICD-10-CM | POA: Diagnosis not present

## 2021-03-07 DIAGNOSIS — L409 Psoriasis, unspecified: Secondary | ICD-10-CM | POA: Diagnosis not present

## 2021-03-07 DIAGNOSIS — G8191 Hemiplegia, unspecified affecting right dominant side: Secondary | ICD-10-CM | POA: Diagnosis not present

## 2021-04-17 DIAGNOSIS — G894 Chronic pain syndrome: Secondary | ICD-10-CM | POA: Diagnosis not present

## 2021-05-01 DIAGNOSIS — H6691 Otitis media, unspecified, right ear: Secondary | ICD-10-CM | POA: Diagnosis not present

## 2021-05-03 DIAGNOSIS — M545 Low back pain, unspecified: Secondary | ICD-10-CM | POA: Diagnosis not present

## 2021-05-03 DIAGNOSIS — R52 Pain, unspecified: Secondary | ICD-10-CM | POA: Diagnosis not present

## 2021-05-03 DIAGNOSIS — M79604 Pain in right leg: Secondary | ICD-10-CM | POA: Diagnosis not present

## 2021-05-19 DIAGNOSIS — B9689 Other specified bacterial agents as the cause of diseases classified elsewhere: Secondary | ICD-10-CM | POA: Diagnosis not present

## 2021-05-19 DIAGNOSIS — J208 Acute bronchitis due to other specified organisms: Secondary | ICD-10-CM | POA: Diagnosis not present

## 2021-06-09 DIAGNOSIS — L409 Psoriasis, unspecified: Secondary | ICD-10-CM | POA: Diagnosis not present

## 2021-06-09 DIAGNOSIS — J449 Chronic obstructive pulmonary disease, unspecified: Secondary | ICD-10-CM | POA: Diagnosis not present

## 2021-06-09 DIAGNOSIS — R7301 Impaired fasting glucose: Secondary | ICD-10-CM | POA: Diagnosis not present

## 2021-06-09 DIAGNOSIS — G894 Chronic pain syndrome: Secondary | ICD-10-CM | POA: Diagnosis not present

## 2021-06-09 DIAGNOSIS — Z1231 Encounter for screening mammogram for malignant neoplasm of breast: Secondary | ICD-10-CM | POA: Diagnosis not present

## 2021-06-09 DIAGNOSIS — E785 Hyperlipidemia, unspecified: Secondary | ICD-10-CM | POA: Diagnosis not present

## 2021-06-09 DIAGNOSIS — I10 Essential (primary) hypertension: Secondary | ICD-10-CM | POA: Diagnosis not present

## 2021-06-09 DIAGNOSIS — G8191 Hemiplegia, unspecified affecting right dominant side: Secondary | ICD-10-CM | POA: Diagnosis not present

## 2021-06-21 DIAGNOSIS — M79604 Pain in right leg: Secondary | ICD-10-CM | POA: Diagnosis not present

## 2021-06-21 DIAGNOSIS — M545 Low back pain, unspecified: Secondary | ICD-10-CM | POA: Diagnosis not present

## 2021-07-03 DIAGNOSIS — G894 Chronic pain syndrome: Secondary | ICD-10-CM | POA: Diagnosis not present

## 2021-08-09 DIAGNOSIS — H5213 Myopia, bilateral: Secondary | ICD-10-CM | POA: Diagnosis not present

## 2021-08-14 DIAGNOSIS — Z79899 Other long term (current) drug therapy: Secondary | ICD-10-CM | POA: Diagnosis not present

## 2021-08-14 DIAGNOSIS — F1110 Opioid abuse, uncomplicated: Secondary | ICD-10-CM | POA: Diagnosis not present

## 2021-08-16 DIAGNOSIS — M545 Low back pain, unspecified: Secondary | ICD-10-CM | POA: Diagnosis not present

## 2021-08-16 DIAGNOSIS — M79604 Pain in right leg: Secondary | ICD-10-CM | POA: Diagnosis not present

## 2021-08-16 DIAGNOSIS — R52 Pain, unspecified: Secondary | ICD-10-CM | POA: Diagnosis not present

## 2021-08-31 DIAGNOSIS — F1110 Opioid abuse, uncomplicated: Secondary | ICD-10-CM | POA: Diagnosis not present

## 2021-08-31 DIAGNOSIS — Z79899 Other long term (current) drug therapy: Secondary | ICD-10-CM | POA: Diagnosis not present

## 2021-10-03 DIAGNOSIS — M9902 Segmental and somatic dysfunction of thoracic region: Secondary | ICD-10-CM | POA: Diagnosis not present

## 2021-10-03 DIAGNOSIS — M5136 Other intervertebral disc degeneration, lumbar region: Secondary | ICD-10-CM | POA: Diagnosis not present

## 2021-10-03 DIAGNOSIS — M9903 Segmental and somatic dysfunction of lumbar region: Secondary | ICD-10-CM | POA: Diagnosis not present

## 2021-10-03 DIAGNOSIS — M9905 Segmental and somatic dysfunction of pelvic region: Secondary | ICD-10-CM | POA: Diagnosis not present

## 2021-10-04 DIAGNOSIS — M9905 Segmental and somatic dysfunction of pelvic region: Secondary | ICD-10-CM | POA: Diagnosis not present

## 2021-10-04 DIAGNOSIS — M9903 Segmental and somatic dysfunction of lumbar region: Secondary | ICD-10-CM | POA: Diagnosis not present

## 2021-10-04 DIAGNOSIS — M9902 Segmental and somatic dysfunction of thoracic region: Secondary | ICD-10-CM | POA: Diagnosis not present

## 2021-10-04 DIAGNOSIS — M5136 Other intervertebral disc degeneration, lumbar region: Secondary | ICD-10-CM | POA: Diagnosis not present

## 2021-10-06 DIAGNOSIS — M5136 Other intervertebral disc degeneration, lumbar region: Secondary | ICD-10-CM | POA: Diagnosis not present

## 2021-10-06 DIAGNOSIS — M9905 Segmental and somatic dysfunction of pelvic region: Secondary | ICD-10-CM | POA: Diagnosis not present

## 2021-10-06 DIAGNOSIS — M9903 Segmental and somatic dysfunction of lumbar region: Secondary | ICD-10-CM | POA: Diagnosis not present

## 2021-10-06 DIAGNOSIS — M9902 Segmental and somatic dysfunction of thoracic region: Secondary | ICD-10-CM | POA: Diagnosis not present

## 2021-10-09 DIAGNOSIS — M9903 Segmental and somatic dysfunction of lumbar region: Secondary | ICD-10-CM | POA: Diagnosis not present

## 2021-10-09 DIAGNOSIS — M9905 Segmental and somatic dysfunction of pelvic region: Secondary | ICD-10-CM | POA: Diagnosis not present

## 2021-10-09 DIAGNOSIS — M5136 Other intervertebral disc degeneration, lumbar region: Secondary | ICD-10-CM | POA: Diagnosis not present

## 2021-10-09 DIAGNOSIS — M9902 Segmental and somatic dysfunction of thoracic region: Secondary | ICD-10-CM | POA: Diagnosis not present

## 2021-10-11 DIAGNOSIS — M9905 Segmental and somatic dysfunction of pelvic region: Secondary | ICD-10-CM | POA: Diagnosis not present

## 2021-10-11 DIAGNOSIS — M5136 Other intervertebral disc degeneration, lumbar region: Secondary | ICD-10-CM | POA: Diagnosis not present

## 2021-10-11 DIAGNOSIS — M79604 Pain in right leg: Secondary | ICD-10-CM | POA: Diagnosis not present

## 2021-10-11 DIAGNOSIS — M9903 Segmental and somatic dysfunction of lumbar region: Secondary | ICD-10-CM | POA: Diagnosis not present

## 2021-10-11 DIAGNOSIS — Z79899 Other long term (current) drug therapy: Secondary | ICD-10-CM | POA: Diagnosis not present

## 2021-10-11 DIAGNOSIS — M9902 Segmental and somatic dysfunction of thoracic region: Secondary | ICD-10-CM | POA: Diagnosis not present

## 2021-10-11 DIAGNOSIS — M545 Low back pain, unspecified: Secondary | ICD-10-CM | POA: Diagnosis not present

## 2021-10-11 DIAGNOSIS — G894 Chronic pain syndrome: Secondary | ICD-10-CM | POA: Diagnosis not present

## 2021-10-11 DIAGNOSIS — R52 Pain, unspecified: Secondary | ICD-10-CM | POA: Diagnosis not present

## 2021-10-13 DIAGNOSIS — M9902 Segmental and somatic dysfunction of thoracic region: Secondary | ICD-10-CM | POA: Diagnosis not present

## 2021-10-13 DIAGNOSIS — M9903 Segmental and somatic dysfunction of lumbar region: Secondary | ICD-10-CM | POA: Diagnosis not present

## 2021-10-13 DIAGNOSIS — M9905 Segmental and somatic dysfunction of pelvic region: Secondary | ICD-10-CM | POA: Diagnosis not present

## 2021-10-13 DIAGNOSIS — M5136 Other intervertebral disc degeneration, lumbar region: Secondary | ICD-10-CM | POA: Diagnosis not present

## 2021-10-16 DIAGNOSIS — M9905 Segmental and somatic dysfunction of pelvic region: Secondary | ICD-10-CM | POA: Diagnosis not present

## 2021-10-16 DIAGNOSIS — M9902 Segmental and somatic dysfunction of thoracic region: Secondary | ICD-10-CM | POA: Diagnosis not present

## 2021-10-16 DIAGNOSIS — M5136 Other intervertebral disc degeneration, lumbar region: Secondary | ICD-10-CM | POA: Diagnosis not present

## 2021-10-16 DIAGNOSIS — M9903 Segmental and somatic dysfunction of lumbar region: Secondary | ICD-10-CM | POA: Diagnosis not present

## 2021-10-19 DIAGNOSIS — M9903 Segmental and somatic dysfunction of lumbar region: Secondary | ICD-10-CM | POA: Diagnosis not present

## 2021-10-19 DIAGNOSIS — M5136 Other intervertebral disc degeneration, lumbar region: Secondary | ICD-10-CM | POA: Diagnosis not present

## 2021-10-19 DIAGNOSIS — M9905 Segmental and somatic dysfunction of pelvic region: Secondary | ICD-10-CM | POA: Diagnosis not present

## 2021-10-19 DIAGNOSIS — M9902 Segmental and somatic dysfunction of thoracic region: Secondary | ICD-10-CM | POA: Diagnosis not present

## 2021-10-20 DIAGNOSIS — M9905 Segmental and somatic dysfunction of pelvic region: Secondary | ICD-10-CM | POA: Diagnosis not present

## 2021-10-20 DIAGNOSIS — M9902 Segmental and somatic dysfunction of thoracic region: Secondary | ICD-10-CM | POA: Diagnosis not present

## 2021-10-20 DIAGNOSIS — M5136 Other intervertebral disc degeneration, lumbar region: Secondary | ICD-10-CM | POA: Diagnosis not present

## 2021-10-20 DIAGNOSIS — M9903 Segmental and somatic dysfunction of lumbar region: Secondary | ICD-10-CM | POA: Diagnosis not present

## 2021-10-23 DIAGNOSIS — M9903 Segmental and somatic dysfunction of lumbar region: Secondary | ICD-10-CM | POA: Diagnosis not present

## 2021-10-23 DIAGNOSIS — M9902 Segmental and somatic dysfunction of thoracic region: Secondary | ICD-10-CM | POA: Diagnosis not present

## 2021-10-23 DIAGNOSIS — M5136 Other intervertebral disc degeneration, lumbar region: Secondary | ICD-10-CM | POA: Diagnosis not present

## 2021-10-23 DIAGNOSIS — M9905 Segmental and somatic dysfunction of pelvic region: Secondary | ICD-10-CM | POA: Diagnosis not present

## 2021-10-25 DIAGNOSIS — M9903 Segmental and somatic dysfunction of lumbar region: Secondary | ICD-10-CM | POA: Diagnosis not present

## 2021-10-25 DIAGNOSIS — M5136 Other intervertebral disc degeneration, lumbar region: Secondary | ICD-10-CM | POA: Diagnosis not present

## 2021-10-25 DIAGNOSIS — M9905 Segmental and somatic dysfunction of pelvic region: Secondary | ICD-10-CM | POA: Diagnosis not present

## 2021-10-25 DIAGNOSIS — M9902 Segmental and somatic dysfunction of thoracic region: Secondary | ICD-10-CM | POA: Diagnosis not present

## 2021-10-27 DIAGNOSIS — M5136 Other intervertebral disc degeneration, lumbar region: Secondary | ICD-10-CM | POA: Diagnosis not present

## 2021-10-27 DIAGNOSIS — M9902 Segmental and somatic dysfunction of thoracic region: Secondary | ICD-10-CM | POA: Diagnosis not present

## 2021-10-27 DIAGNOSIS — M9903 Segmental and somatic dysfunction of lumbar region: Secondary | ICD-10-CM | POA: Diagnosis not present

## 2021-10-27 DIAGNOSIS — M9905 Segmental and somatic dysfunction of pelvic region: Secondary | ICD-10-CM | POA: Diagnosis not present

## 2021-11-01 DIAGNOSIS — M9905 Segmental and somatic dysfunction of pelvic region: Secondary | ICD-10-CM | POA: Diagnosis not present

## 2021-11-01 DIAGNOSIS — M9902 Segmental and somatic dysfunction of thoracic region: Secondary | ICD-10-CM | POA: Diagnosis not present

## 2021-11-01 DIAGNOSIS — M9903 Segmental and somatic dysfunction of lumbar region: Secondary | ICD-10-CM | POA: Diagnosis not present

## 2021-11-01 DIAGNOSIS — M5136 Other intervertebral disc degeneration, lumbar region: Secondary | ICD-10-CM | POA: Diagnosis not present

## 2021-11-02 DIAGNOSIS — M5136 Other intervertebral disc degeneration, lumbar region: Secondary | ICD-10-CM | POA: Diagnosis not present

## 2021-11-02 DIAGNOSIS — L409 Psoriasis, unspecified: Secondary | ICD-10-CM | POA: Diagnosis not present

## 2021-11-02 DIAGNOSIS — M9902 Segmental and somatic dysfunction of thoracic region: Secondary | ICD-10-CM | POA: Diagnosis not present

## 2021-11-02 DIAGNOSIS — G8191 Hemiplegia, unspecified affecting right dominant side: Secondary | ICD-10-CM | POA: Diagnosis not present

## 2021-11-02 DIAGNOSIS — M9903 Segmental and somatic dysfunction of lumbar region: Secondary | ICD-10-CM | POA: Diagnosis not present

## 2021-11-02 DIAGNOSIS — I10 Essential (primary) hypertension: Secondary | ICD-10-CM | POA: Diagnosis not present

## 2021-11-02 DIAGNOSIS — Z1231 Encounter for screening mammogram for malignant neoplasm of breast: Secondary | ICD-10-CM | POA: Diagnosis not present

## 2021-11-02 DIAGNOSIS — E785 Hyperlipidemia, unspecified: Secondary | ICD-10-CM | POA: Diagnosis not present

## 2021-11-02 DIAGNOSIS — R7301 Impaired fasting glucose: Secondary | ICD-10-CM | POA: Diagnosis not present

## 2021-11-02 DIAGNOSIS — J449 Chronic obstructive pulmonary disease, unspecified: Secondary | ICD-10-CM | POA: Diagnosis not present

## 2021-11-02 DIAGNOSIS — M9905 Segmental and somatic dysfunction of pelvic region: Secondary | ICD-10-CM | POA: Diagnosis not present

## 2021-11-02 DIAGNOSIS — G894 Chronic pain syndrome: Secondary | ICD-10-CM | POA: Diagnosis not present

## 2021-11-06 DIAGNOSIS — M9905 Segmental and somatic dysfunction of pelvic region: Secondary | ICD-10-CM | POA: Diagnosis not present

## 2021-11-06 DIAGNOSIS — M9903 Segmental and somatic dysfunction of lumbar region: Secondary | ICD-10-CM | POA: Diagnosis not present

## 2021-11-06 DIAGNOSIS — M5136 Other intervertebral disc degeneration, lumbar region: Secondary | ICD-10-CM | POA: Diagnosis not present

## 2021-11-06 DIAGNOSIS — M9902 Segmental and somatic dysfunction of thoracic region: Secondary | ICD-10-CM | POA: Diagnosis not present

## 2021-11-08 DIAGNOSIS — M79604 Pain in right leg: Secondary | ICD-10-CM | POA: Diagnosis not present

## 2021-11-08 DIAGNOSIS — M9905 Segmental and somatic dysfunction of pelvic region: Secondary | ICD-10-CM | POA: Diagnosis not present

## 2021-11-08 DIAGNOSIS — M9903 Segmental and somatic dysfunction of lumbar region: Secondary | ICD-10-CM | POA: Diagnosis not present

## 2021-11-08 DIAGNOSIS — M5136 Other intervertebral disc degeneration, lumbar region: Secondary | ICD-10-CM | POA: Diagnosis not present

## 2021-11-08 DIAGNOSIS — M545 Low back pain, unspecified: Secondary | ICD-10-CM | POA: Diagnosis not present

## 2021-11-08 DIAGNOSIS — M9902 Segmental and somatic dysfunction of thoracic region: Secondary | ICD-10-CM | POA: Diagnosis not present

## 2021-11-08 DIAGNOSIS — R52 Pain, unspecified: Secondary | ICD-10-CM | POA: Diagnosis not present

## 2021-11-15 DIAGNOSIS — M9902 Segmental and somatic dysfunction of thoracic region: Secondary | ICD-10-CM | POA: Diagnosis not present

## 2021-11-15 DIAGNOSIS — M9905 Segmental and somatic dysfunction of pelvic region: Secondary | ICD-10-CM | POA: Diagnosis not present

## 2021-11-15 DIAGNOSIS — M5136 Other intervertebral disc degeneration, lumbar region: Secondary | ICD-10-CM | POA: Diagnosis not present

## 2021-11-15 DIAGNOSIS — M9903 Segmental and somatic dysfunction of lumbar region: Secondary | ICD-10-CM | POA: Diagnosis not present

## 2021-11-22 DIAGNOSIS — M9902 Segmental and somatic dysfunction of thoracic region: Secondary | ICD-10-CM | POA: Diagnosis not present

## 2021-11-22 DIAGNOSIS — M5136 Other intervertebral disc degeneration, lumbar region: Secondary | ICD-10-CM | POA: Diagnosis not present

## 2021-11-22 DIAGNOSIS — M9903 Segmental and somatic dysfunction of lumbar region: Secondary | ICD-10-CM | POA: Diagnosis not present

## 2021-11-22 DIAGNOSIS — M9905 Segmental and somatic dysfunction of pelvic region: Secondary | ICD-10-CM | POA: Diagnosis not present

## 2021-11-27 DIAGNOSIS — G894 Chronic pain syndrome: Secondary | ICD-10-CM | POA: Diagnosis not present

## 2021-11-27 DIAGNOSIS — Z79899 Other long term (current) drug therapy: Secondary | ICD-10-CM | POA: Diagnosis not present

## 2021-11-29 DIAGNOSIS — M9903 Segmental and somatic dysfunction of lumbar region: Secondary | ICD-10-CM | POA: Diagnosis not present

## 2021-11-29 DIAGNOSIS — M5136 Other intervertebral disc degeneration, lumbar region: Secondary | ICD-10-CM | POA: Diagnosis not present

## 2021-11-29 DIAGNOSIS — M9905 Segmental and somatic dysfunction of pelvic region: Secondary | ICD-10-CM | POA: Diagnosis not present

## 2021-11-29 DIAGNOSIS — M9902 Segmental and somatic dysfunction of thoracic region: Secondary | ICD-10-CM | POA: Diagnosis not present

## 2021-12-06 DIAGNOSIS — M9902 Segmental and somatic dysfunction of thoracic region: Secondary | ICD-10-CM | POA: Diagnosis not present

## 2021-12-06 DIAGNOSIS — M9903 Segmental and somatic dysfunction of lumbar region: Secondary | ICD-10-CM | POA: Diagnosis not present

## 2021-12-06 DIAGNOSIS — M9905 Segmental and somatic dysfunction of pelvic region: Secondary | ICD-10-CM | POA: Diagnosis not present

## 2021-12-06 DIAGNOSIS — M5136 Other intervertebral disc degeneration, lumbar region: Secondary | ICD-10-CM | POA: Diagnosis not present

## 2021-12-13 DIAGNOSIS — M9902 Segmental and somatic dysfunction of thoracic region: Secondary | ICD-10-CM | POA: Diagnosis not present

## 2021-12-13 DIAGNOSIS — M5136 Other intervertebral disc degeneration, lumbar region: Secondary | ICD-10-CM | POA: Diagnosis not present

## 2021-12-13 DIAGNOSIS — M545 Low back pain, unspecified: Secondary | ICD-10-CM | POA: Diagnosis not present

## 2021-12-13 DIAGNOSIS — R52 Pain, unspecified: Secondary | ICD-10-CM | POA: Diagnosis not present

## 2021-12-13 DIAGNOSIS — M9903 Segmental and somatic dysfunction of lumbar region: Secondary | ICD-10-CM | POA: Diagnosis not present

## 2021-12-13 DIAGNOSIS — M9905 Segmental and somatic dysfunction of pelvic region: Secondary | ICD-10-CM | POA: Diagnosis not present

## 2021-12-13 DIAGNOSIS — M79604 Pain in right leg: Secondary | ICD-10-CM | POA: Diagnosis not present

## 2021-12-27 DIAGNOSIS — M9903 Segmental and somatic dysfunction of lumbar region: Secondary | ICD-10-CM | POA: Diagnosis not present

## 2021-12-27 DIAGNOSIS — M9905 Segmental and somatic dysfunction of pelvic region: Secondary | ICD-10-CM | POA: Diagnosis not present

## 2021-12-27 DIAGNOSIS — M9902 Segmental and somatic dysfunction of thoracic region: Secondary | ICD-10-CM | POA: Diagnosis not present

## 2021-12-27 DIAGNOSIS — M5136 Other intervertebral disc degeneration, lumbar region: Secondary | ICD-10-CM | POA: Diagnosis not present

## 2022-01-05 DIAGNOSIS — Z1231 Encounter for screening mammogram for malignant neoplasm of breast: Secondary | ICD-10-CM | POA: Diagnosis not present

## 2022-01-10 DIAGNOSIS — M5136 Other intervertebral disc degeneration, lumbar region: Secondary | ICD-10-CM | POA: Diagnosis not present

## 2022-01-10 DIAGNOSIS — M9902 Segmental and somatic dysfunction of thoracic region: Secondary | ICD-10-CM | POA: Diagnosis not present

## 2022-01-10 DIAGNOSIS — M9905 Segmental and somatic dysfunction of pelvic region: Secondary | ICD-10-CM | POA: Diagnosis not present

## 2022-01-10 DIAGNOSIS — M9903 Segmental and somatic dysfunction of lumbar region: Secondary | ICD-10-CM | POA: Diagnosis not present

## 2022-01-24 DIAGNOSIS — M9903 Segmental and somatic dysfunction of lumbar region: Secondary | ICD-10-CM | POA: Diagnosis not present

## 2022-01-24 DIAGNOSIS — M9905 Segmental and somatic dysfunction of pelvic region: Secondary | ICD-10-CM | POA: Diagnosis not present

## 2022-01-24 DIAGNOSIS — M9902 Segmental and somatic dysfunction of thoracic region: Secondary | ICD-10-CM | POA: Diagnosis not present

## 2022-01-24 DIAGNOSIS — M5136 Other intervertebral disc degeneration, lumbar region: Secondary | ICD-10-CM | POA: Diagnosis not present

## 2022-02-07 DIAGNOSIS — M9903 Segmental and somatic dysfunction of lumbar region: Secondary | ICD-10-CM | POA: Diagnosis not present

## 2022-02-07 DIAGNOSIS — R7301 Impaired fasting glucose: Secondary | ICD-10-CM | POA: Diagnosis not present

## 2022-02-07 DIAGNOSIS — M9905 Segmental and somatic dysfunction of pelvic region: Secondary | ICD-10-CM | POA: Diagnosis not present

## 2022-02-07 DIAGNOSIS — I10 Essential (primary) hypertension: Secondary | ICD-10-CM | POA: Diagnosis not present

## 2022-02-07 DIAGNOSIS — M9902 Segmental and somatic dysfunction of thoracic region: Secondary | ICD-10-CM | POA: Diagnosis not present

## 2022-02-07 DIAGNOSIS — E785 Hyperlipidemia, unspecified: Secondary | ICD-10-CM | POA: Diagnosis not present

## 2022-02-07 DIAGNOSIS — G894 Chronic pain syndrome: Secondary | ICD-10-CM | POA: Diagnosis not present

## 2022-02-07 DIAGNOSIS — M79604 Pain in right leg: Secondary | ICD-10-CM | POA: Diagnosis not present

## 2022-02-07 DIAGNOSIS — R52 Pain, unspecified: Secondary | ICD-10-CM | POA: Diagnosis not present

## 2022-02-07 DIAGNOSIS — M5136 Other intervertebral disc degeneration, lumbar region: Secondary | ICD-10-CM | POA: Diagnosis not present

## 2022-02-07 DIAGNOSIS — M545 Low back pain, unspecified: Secondary | ICD-10-CM | POA: Diagnosis not present

## 2022-02-07 DIAGNOSIS — G8191 Hemiplegia, unspecified affecting right dominant side: Secondary | ICD-10-CM | POA: Diagnosis not present

## 2022-02-28 DIAGNOSIS — M9902 Segmental and somatic dysfunction of thoracic region: Secondary | ICD-10-CM | POA: Diagnosis not present

## 2022-02-28 DIAGNOSIS — M9903 Segmental and somatic dysfunction of lumbar region: Secondary | ICD-10-CM | POA: Diagnosis not present

## 2022-02-28 DIAGNOSIS — M5136 Other intervertebral disc degeneration, lumbar region: Secondary | ICD-10-CM | POA: Diagnosis not present

## 2022-02-28 DIAGNOSIS — M9905 Segmental and somatic dysfunction of pelvic region: Secondary | ICD-10-CM | POA: Diagnosis not present

## 2022-03-06 DIAGNOSIS — R002 Palpitations: Secondary | ICD-10-CM | POA: Diagnosis not present

## 2022-03-06 DIAGNOSIS — I10 Essential (primary) hypertension: Secondary | ICD-10-CM | POA: Diagnosis not present

## 2022-03-06 DIAGNOSIS — R600 Localized edema: Secondary | ICD-10-CM | POA: Diagnosis not present

## 2022-03-14 DIAGNOSIS — M545 Low back pain, unspecified: Secondary | ICD-10-CM | POA: Diagnosis not present

## 2022-03-14 DIAGNOSIS — M79604 Pain in right leg: Secondary | ICD-10-CM | POA: Diagnosis not present

## 2022-03-14 DIAGNOSIS — Z79899 Other long term (current) drug therapy: Secondary | ICD-10-CM | POA: Diagnosis not present

## 2022-03-16 DIAGNOSIS — M9905 Segmental and somatic dysfunction of pelvic region: Secondary | ICD-10-CM | POA: Diagnosis not present

## 2022-03-16 DIAGNOSIS — M5136 Other intervertebral disc degeneration, lumbar region: Secondary | ICD-10-CM | POA: Diagnosis not present

## 2022-03-16 DIAGNOSIS — M9903 Segmental and somatic dysfunction of lumbar region: Secondary | ICD-10-CM | POA: Diagnosis not present

## 2022-03-16 DIAGNOSIS — M9902 Segmental and somatic dysfunction of thoracic region: Secondary | ICD-10-CM | POA: Diagnosis not present

## 2022-03-19 ENCOUNTER — Encounter: Payer: Self-pay | Admitting: Cardiology

## 2022-03-19 ENCOUNTER — Encounter: Payer: Self-pay | Admitting: *Deleted

## 2022-03-19 DIAGNOSIS — G43909 Migraine, unspecified, not intractable, without status migrainosus: Secondary | ICD-10-CM | POA: Insufficient documentation

## 2022-03-19 DIAGNOSIS — F419 Anxiety disorder, unspecified: Secondary | ICD-10-CM | POA: Insufficient documentation

## 2022-03-19 DIAGNOSIS — E559 Vitamin D deficiency, unspecified: Secondary | ICD-10-CM | POA: Insufficient documentation

## 2022-03-19 DIAGNOSIS — H6990 Unspecified Eustachian tube disorder, unspecified ear: Secondary | ICD-10-CM | POA: Insufficient documentation

## 2022-03-19 DIAGNOSIS — E785 Hyperlipidemia, unspecified: Secondary | ICD-10-CM | POA: Insufficient documentation

## 2022-03-19 DIAGNOSIS — K589 Irritable bowel syndrome without diarrhea: Secondary | ICD-10-CM | POA: Insufficient documentation

## 2022-03-19 DIAGNOSIS — A048 Other specified bacterial intestinal infections: Secondary | ICD-10-CM | POA: Insufficient documentation

## 2022-03-19 DIAGNOSIS — R5382 Chronic fatigue, unspecified: Secondary | ICD-10-CM | POA: Insufficient documentation

## 2022-03-19 DIAGNOSIS — K219 Gastro-esophageal reflux disease without esophagitis: Secondary | ICD-10-CM | POA: Insufficient documentation

## 2022-03-19 DIAGNOSIS — F32A Depression, unspecified: Secondary | ICD-10-CM | POA: Insufficient documentation

## 2022-03-19 DIAGNOSIS — K649 Unspecified hemorrhoids: Secondary | ICD-10-CM | POA: Insufficient documentation

## 2022-03-19 DIAGNOSIS — M50322 Other cervical disc degeneration at C5-C6 level: Secondary | ICD-10-CM | POA: Insufficient documentation

## 2022-03-19 DIAGNOSIS — L409 Psoriasis, unspecified: Secondary | ICD-10-CM | POA: Insufficient documentation

## 2022-03-19 DIAGNOSIS — G4733 Obstructive sleep apnea (adult) (pediatric): Secondary | ICD-10-CM | POA: Insufficient documentation

## 2022-03-20 DIAGNOSIS — R600 Localized edema: Secondary | ICD-10-CM | POA: Diagnosis not present

## 2022-03-20 DIAGNOSIS — I10 Essential (primary) hypertension: Secondary | ICD-10-CM | POA: Diagnosis not present

## 2022-03-20 DIAGNOSIS — R002 Palpitations: Secondary | ICD-10-CM | POA: Diagnosis not present

## 2022-03-21 ENCOUNTER — Ambulatory Visit: Payer: Medicare HMO | Attending: Cardiology

## 2022-03-21 ENCOUNTER — Ambulatory Visit: Payer: Medicare HMO | Attending: Cardiology | Admitting: Cardiology

## 2022-03-21 ENCOUNTER — Encounter: Payer: Self-pay | Admitting: Cardiology

## 2022-03-21 VITALS — BP 192/98 | HR 70 | Ht 59.0 in | Wt 189.0 lb

## 2022-03-21 DIAGNOSIS — J4489 Other specified chronic obstructive pulmonary disease: Secondary | ICD-10-CM

## 2022-03-21 DIAGNOSIS — I10 Essential (primary) hypertension: Secondary | ICD-10-CM

## 2022-03-21 DIAGNOSIS — R600 Localized edema: Secondary | ICD-10-CM

## 2022-03-21 DIAGNOSIS — G8929 Other chronic pain: Secondary | ICD-10-CM

## 2022-03-21 DIAGNOSIS — R002 Palpitations: Secondary | ICD-10-CM

## 2022-03-21 DIAGNOSIS — M9903 Segmental and somatic dysfunction of lumbar region: Secondary | ICD-10-CM | POA: Diagnosis not present

## 2022-03-21 DIAGNOSIS — M9902 Segmental and somatic dysfunction of thoracic region: Secondary | ICD-10-CM | POA: Diagnosis not present

## 2022-03-21 DIAGNOSIS — M9905 Segmental and somatic dysfunction of pelvic region: Secondary | ICD-10-CM | POA: Diagnosis not present

## 2022-03-21 DIAGNOSIS — M5136 Other intervertebral disc degeneration, lumbar region: Secondary | ICD-10-CM | POA: Diagnosis not present

## 2022-03-21 MED ORDER — FUROSEMIDE 20 MG PO TABS: 20.0000 mg | ORAL_TABLET | ORAL | 1 refills | Status: DC

## 2022-03-21 NOTE — Progress Notes (Signed)
Cardiology Office Note:    Date:  03/21/2022   ID:  Lynn HEMMINGWAY, DOB 03-Feb-1966, MRN [id number]  PCP:  Nicoletta Dress, MD  Cardiologist:  Shirlee More, MD   Referring MD: Nicoletta Dress, MD  ASSESSMENT:    1. Bilateral lower extremity edema   2. Essential hypertension   3. Palpitations   4. COPD with asthma   5. Other chronic pain    PLAN:    In order of problems listed above:  She has multiple potential mechanisms including side effect of gabapentin secondary to lung disease untreated sleep apnea reviewed the potential of hypertensive heart disease with heart failure.  Effective in his situation his proBNP level is low with points his wife from heart failure and I think we should recheck an echocardiogram for the left right ventricular function and pulmonary artery pressure.  I think she should stay on her current loop diuretic that is been helpful and she tells me she cannot live without gabapentin. Will recheck her blood pressure in the office continue diuretic ACE inhibitor encouraged her to check her blood pressure at home with a validated device and good technique and record and bring to doctors visits Will also use a 1 week event monitor I doubt she has developed significant arrhythmia since her loop recorder was explanted Continue her bronchodilator likely the source of her shortness of breath Unfortunately she said she cannot stop taking gabapentin I think she should remain on a loop diuretic  Next appointment 3 months   Medication Adjustments/Labs and Tests Ordered: Current medicines are reviewed at length with the patient today.  Concerns regarding medicines are outlined above.  Orders Placed This Encounter  Procedures   Pro b natriuretic peptide   LONG TERM MONITOR (3-14 DAYS)   EKG 12-Lead   ECHOCARDIOGRAM COMPLETE   Meds ordered this encounter  Medications   DISCONTD: furosemide (LASIX) 20 MG tablet    Sig: Take 1 tablet (20 mg total) by mouth 2  (two) times a week. As needed    Dispense:  45 tablet    Refill:  1      Chief complaint is edema I am concerned of heart failure  History of Present Illness:    Lynn Rollins is a 56 y.o. female with a history of hypertension hyperlipidemia COPD and stroke in 2018 with an implanted loop recorder without demonstration of atrial fibrillation who is being seen today for the evaluation of lower extremity edema at the request of Nicoletta Dress, MD.  She had an echocardiogram 08/15/2017 at [facility] left ventricle is normal in size wall thickness systolic function grade 1 diastolic dysfunction  Her chronic pain she takes gabapentin been on it for several months and gained 2 over 20 pounds developed peripheral edema and is better taking a loop diuretic She has chronic shortness of breath with COPD/asthma She is can discerned regarding her father dying with heart failure. She is not having orthopnea she also has obstructive sleep apnea is not on CPAP No chest pain palpitations syncope She is also having bothersome palpitations several times a week with her heart racing and is concerned.  This occurred after her ILR was explanted Past Medical History:  Diagnosis Date   Acute ischemic left ACA stroke (HCC)    Anxiety    Asthma    Brain lesion 2011-2012   MRI   Chronic fatigue    Chronic pain syndrome    Conversion disorder  COPD (chronic obstructive pulmonary disease) (HCC)    Degeneration of C5-C6 intervertebral disc    Depression    Eustachian tube dysfunction    Fibromyalgia    Generalized osteoarthritis of multiple sites    GERD (gastroesophageal reflux disease)    H. pylori infection    Hearing loss    Hemorrhoid    Hiatal hernia with GERD    Hyperlipemia    Hypertension    Hypoglycemia    IBS (irritable bowel syndrome)    Migraine    Morbidly obese (HCC)    OSA on CPAP    Scalp psoriasis    Stroke [facility])    Vitamin D deficiency     Past Surgical  History:  Procedure Laterality Date   ABDOMINAL HYSTERECTOMY     CHOLECYSTECTOMY     COLONOSCOPY  04/28/2015   Mild sigmoid diverticulosis. Otherwise normal colonoscopy   ESOPHAGOGASTRODUODENOSCOPY  09/11/2013   Mild gastritis. Otherwise normal esophagogastroduodenoscopy   LOOP RECORDER INSERTION N/A 10/01/2016   Procedure: Loop Recorder Insertion;  Surgeon: Deboraha Sprang, MD;  Location: [place] [facility];  Service: Cardiovascular;  Laterality: N/A;   LOOP RECORDER REMOVAL N/A 09/07/2020   Procedure: LOOP RECORDER REMOVAL;  Surgeon: Deboraha Sprang, MD;  Location: [place] [facility];  Service: Cardiovascular;  Laterality: N/A;   OTHER SURGICAL HISTORY     screw in head. Bone anch [place] hearing aid   TEE WITHOUT CARDIOVERSION N/A 10/01/2016   Procedure: TRANSESOPHAGEAL ECHOCARDIOGRAM (TEE);  Surgeon: Thayer Headings, MD;  Location: [facility] ENDOSCOPY;  Service: Cardiovascular;  Laterality: N/A;   TUBAL LIGATION      Current Medications: Current Meds  Medication Sig   albuterol (PROVENTIL HFA;VENTOLIN HFA) 108 (90 BASE) MCG/ACT inhaler Inhale 2 puffs into the lungs every 4 (four) hours as needed for wheezing.    benzonatate (TESSALON) 200 MG capsule Take 200 mg by mouth 3 (three) times daily as needed for cough.   calcipotriene (DOVONOX) 0.005 % cream Apply 1 Application topically 2 (two) times daily.   clobetasol (TEMOVATE) 0.05 % external solution Apply 1 Application topically 2 (two) times daily.   clopidogrel (PLAVIX) 75 MG tablet Take 75 mg by mouth daily.   DEXILANT 60 MG capsule Take 60 mg by mouth daily.   Eluxadoline (VIBERZI) 75 MG TABS Take 75 mg by mouth 2 (two) times daily as needed (diarrhea and abdominal pain).   famotidine (PEPCID) 40 MG tablet Take 1 tablet by mouth daily.   Fluticasone-Umeclidin-Vilant (TRELEGY ELLIPTA) 200-62.5-25 MCG/ACT AEPB Inhale 1 puff into the lungs daily.   furosemide (LASIX) 40 MG tablet Take 40 mg by mouth daily.   gabapentin (NEURONTIN) 600 MG  tablet Take 600 mg by mouth 3 (three) times daily.   ipratropium-albuterol (DUONEB) 0.5-2.5 (3) MG/3ML SOLN Inhale 3 mLs into the lungs every 6 (six) hours as needed (for shortness of breath or wheezing).    lisinopril (PRINIVIL,ZESTRIL) 40 MG tablet Take 40 mg by mouth daily.   OXYCONTIN 10 MG 12 hr tablet Take 10 mg by mouth QID.    temazepam (RESTORIL) 30 MG capsule Take 30 mg by mouth at bedtime as needed for sleep.   triamcinolone cream (KENALOG) 0.1 % Apply 1 Application topically 2 (two) times daily.   Vitamin D, Ergocalciferol, (DRISDOL) 50000 units CAPS capsule Take 50,000 Units by mouth every Saturday.    [DISCONTINUED] furosemide (LASIX) 20 MG tablet Take 1 tablet (20 mg total) by mouth 2 (two) times a week. As needed  Allergies:   Celexa [citalopram hydrobromide], Tape, Broccoli [brassica oleracea], and Statins   Social History   Socioeconomic History   Marital status: Married    Spouse name: Not on file   Number of children: Not on file   Years of education: Not on file   Highest education level: Not on file  Occupational History   Not on file  Tobacco Use   Smoking status: Never   Smokeless tobacco: Never  Vaping Use   Vaping Use: Never used  Substance and Sexual Activity   Alcohol use: No   Drug use: No   Sexual activity: Not on file  Other Topics Concern   Not on file  Social History Narrative   Lives at home with Antony Haste, husband.  She is disabled.  Education graduate.  2 children.  Caffeine 2 drinks per day.  (most).   Social Determinants of Health   Financial Resource Strain: Not on file  Food Insecurity: Not on file  Transportation Needs: Not on file  Physical Activity: Not on file  Stress: Not on file  Social Connections: Not on file     Family History: The patient's family history includes Alcohol abuse in her father; Asthma in her paternal grandmother; Diabetes in her maternal grandmother and paternal grandmother; Heart attack in her father,  maternal grandmother, mother, and paternal grandmother; Heart disease in her father, maternal grandfather, maternal grandmother, paternal grandfather, and paternal grandmother; Hypertension in her father, maternal grandfather, maternal grandmother, mother, paternal grandfather, and paternal grandmother; Multiple sclerosis in her brother; Other in her father; Parkinson's disease in her father; Stroke in her father, maternal grandfather, maternal grandmother, mother, paternal grandfather, and paternal grandmother; Thyroid disease in her sister. There is no history of Esophageal cancer.  ROS:   ROS Please see the history of present illness.     All other systems reviewed and are negative.  EKGs/Labs/Other Studies Reviewed:    The following studies were reviewed today:   Recent Labs: No results found for requested labs within last 365 days.  Recent Lipid Panel    Component Value Date/Time   CHOL 124 08/09/2017 0622   TRIG 203 (H) 08/09/2017 0622   HDL 31 (L) 08/09/2017 0622   CHOLHDL 4.0 08/09/2017 0622   VLDL 41 (H) 08/09/2017 0622   LDLCALC 52 08/09/2017 0622    Physical Exam:    VS:  BP (!) 204/109 (BP Location: Right Wrist, Patient Position: Sitting)   Pulse 72   Ht '4\' 11"'$  (1.499 m)   Wt 189 lb (85.7 kg)   SpO2 95%   BMI 38.17 kg/m     Wt Readings from Last 3 Encounters:  03/21/22 189 lb (85.7 kg)  03/06/22 186 lb (84.4 kg)  09/07/20 172 lb (78 kg)     GEN:  Well nourished, well developed in no acute distress HEENT: Normal NECK: No JVD; No carotid bruits LYMPHATICS: No lymphadenopathy CARDIAC: RRR, no murmurs, rubs, gallops RESPIRATORY:  Clear to auscultation without rales, wheezing or rhonchi  ABDOMEN: Soft, non-tender, non-distended MUSCULOSKELETAL: Trace edema edema; No deformity  SKIN: Warm and dry NEUROLOGIC:  Alert and oriented x 3 PSYCHIATRIC:  Normal affect     Signed, Shirlee More, MD  03/21/2022 3:01 PM    [facility]

## 2022-03-21 NOTE — Patient Instructions (Signed)
Medication Instructions:  Your physician recommends that you continue on your current medications as directed. Please refer to the Current Medication list given to you today.  *If you need a refill on your cardiac medications before your next appointment, please call your pharmacy*   Lab Work: Your physician recommends that you return for lab work in:   Labs today: Pro BNP  If you have labs (blood work) drawn today and your tests are completely normal, you will receive your results only by: MyChart Message (if you have MyChart) OR A paper copy in the mail If you have any lab test that is abnormal or we need to change your treatment, we will call you to review the results.   Testing/Procedures: A zio monitor was ordered today. It will remain on for 7 days. You will then return monitor and event diary in provided box. It takes 1-2 weeks for report to be downloaded and returned to [place]. We will call you with the results. If monitor falls off or has orange flashing light, please call [employer] for further instructions.   Your physician has requested that you have an echocardiogram. Echocardiography is a painless test that uses sound waves to create images of your heart. It provides your doctor with information about the size and shape of your heart and how well your heart's chambers and valves are working. This procedure takes approximately one hour. There are no restrictions for this procedure. Please do NOT wear cologne, perfume, aftershave, or lotions (deodorant is allowed). Please arrive 15 minutes prior to your appointment time.     Follow-Up: At [facility], you and your health needs are our priority.  As part of our continuing mission to provide you with exceptional heart care, we have created designated Provider Care Teams.  These Care Teams include your primary Cardiologist (physician) and Advanced Practice Providers (APPs -  Physician Assistants and Nurse Practitioners) who all  work together to provide you with the care you need, when you need it.  We recommend signing up for the patient portal called "MyChart".  Sign up information is provided on this After Visit Summary.  MyChart is used to connect with patients for Virtual Visits (Telemedicine).  Patients are able to view lab/test results, encounter notes, upcoming appointments, etc.  Non-urgent messages can be sent to your provider as well.   To learn more about what you can do with MyChart, go to [email or web address].    Your next appointment:   3 month(s)  The format for your next appointment:   In Person  Provider:   Shirlee More, MD    Other Instructions None   Important Information About Sugar      [employer]  Tips to measure your blood pressure correctly  To determine whether you have hypertension, a medical professional will take a blood pressure reading. How you prepare for the test, the position of your arm, and other factors can change a blood pressure reading by 10% or more. That could be enough to hide high blood pressure, start you on a drug you don't really need, or lead your doctor to incorrectly adjust your medications. National and international guidelines offer specific instructions for measuring blood pressure. If a doctor, nurse, or medical assistant isn't doing it right, don't hesitate to ask him or her to get with the guidelines. Here's what you can do to ensure a correct reading:  Don't drink a caffeinated beverage or smoke during the 30 minutes before the test.  Sit  quietly for five minutes before the test begins.  During the measurement, sit in a chair with your feet on the floor and your arm supported so your elbow is at about heart level.  The inflatable part of the cuff should completely cover at least 80% of your upper arm, and the cuff should be placed on bare skin, not over a shirt.  Don't talk during the measurement.  Have your blood pressure measured twice, with a  brief break in between. If the readings are different by 5 points or more, have it done a third time. There are times to break these rules. If you sometimes feel lightheaded when getting out of bed in the morning or when you stand after sitting, you should have your blood pressure checked while seated and then while standing to see if it falls from one position to the next. Because blood pressure varies throughout the day, your doctor will rarely diagnose hypertension on the basis of a single reading. Instead, he or she will want to confirm the measurements on at least two occasions, usually within a few weeks of one another. The exception to this rule is if you have a blood pressure reading of 180/110 mm Hg or higher. A result this high usually calls for prompt treatment. It's also a good idea to have your blood pressure measured in both arms at least once, since the reading in one arm (usually the right) may be higher than that in the left. A 2014 study in The American Journal of Medicine of nearly 3,400 people found average arm- to-arm differences in systolic blood pressure of about 5 points. The higher number should be used to make treatment decisions. In 2017, new guidelines from the [employer], the [employer] of Cardiology, and nine other health organizations lowered the diagnosis of high blood pressure to 130/80 mm Hg or higher for all adults. The guidelines also redefined the various blood pressure categories to now include normal, elevated, Stage 1 hypertension, Stage 2 hypertension, and hypertensive crisis (see "Blood pressure categories"). Blood pressure categories  Blood pressure category SYSTOLIC (upper number)  DIASTOLIC (lower number)  Normal Less than 120 mm Hg and Less than 80 mm Hg  Elevated 120-129 mm Hg and Less than 80 mm Hg  High blood pressure: Stage 1 hypertension 130-139 mm Hg or 80-89 mm Hg  High blood pressure: Stage 2 hypertension 140 mm Hg or higher or  90 mm Hg or higher  Hypertensive crisis (consult your doctor immediately) Higher than 180 mm Hg and/or Higher than 120 mm Hg  Source: American Heart Association and American Stroke Association. For more on getting your blood pressure under control, buy Controlling Your Blood Pressure, a Special Health Report from [facility].

## 2022-03-22 LAB — PRO B NATRIURETIC PEPTIDE: NT-Pro BNP: <36 pg/mL (ref 0–287)

## 2022-03-26 ENCOUNTER — Encounter: Payer: Self-pay | Admitting: Cardiology

## 2022-03-26 ENCOUNTER — Telehealth: Payer: Self-pay | Admitting: Cardiology

## 2022-03-26 NOTE — Telephone Encounter (Signed)
Patient informed of result.

## 2022-03-26 NOTE — Telephone Encounter (Signed)
Pt returning call for lab results  

## 2022-03-26 NOTE — Telephone Encounter (Signed)
Pt is returning call in regards to results 

## 2022-03-27 ENCOUNTER — Telehealth: Payer: Self-pay | Admitting: *Deleted

## 2022-03-27 NOTE — Telephone Encounter (Signed)
Received email from [employer] that pt's device fell off within the first 48 hours of wear. The company sent out another monitor to the pt to apply again.

## 2022-04-03 ENCOUNTER — Encounter: Payer: Self-pay | Admitting: Cardiology

## 2022-04-04 DIAGNOSIS — M9902 Segmental and somatic dysfunction of thoracic region: Secondary | ICD-10-CM | POA: Diagnosis not present

## 2022-04-04 DIAGNOSIS — M5136 Other intervertebral disc degeneration, lumbar region: Secondary | ICD-10-CM | POA: Diagnosis not present

## 2022-04-04 DIAGNOSIS — M9905 Segmental and somatic dysfunction of pelvic region: Secondary | ICD-10-CM | POA: Diagnosis not present

## 2022-04-04 DIAGNOSIS — M9903 Segmental and somatic dysfunction of lumbar region: Secondary | ICD-10-CM | POA: Diagnosis not present

## 2022-04-06 ENCOUNTER — Ambulatory Visit: Payer: Medicare HMO | Attending: Cardiology

## 2022-04-06 DIAGNOSIS — J4489 Other specified chronic obstructive pulmonary disease: Secondary | ICD-10-CM

## 2022-04-06 DIAGNOSIS — G8929 Other chronic pain: Secondary | ICD-10-CM

## 2022-04-06 DIAGNOSIS — R002 Palpitations: Secondary | ICD-10-CM | POA: Diagnosis not present

## 2022-04-06 DIAGNOSIS — I10 Essential (primary) hypertension: Secondary | ICD-10-CM

## 2022-04-06 DIAGNOSIS — R600 Localized edema: Secondary | ICD-10-CM

## 2022-04-08 ENCOUNTER — Encounter: Payer: Self-pay | Admitting: Cardiology

## 2022-04-08 LAB — ECHOCARDIOGRAM COMPLETE
Area-P 1/2: 3.08 cm2
S' Lateral: 3.1 cm

## 2022-04-09 DIAGNOSIS — Z79899 Other long term (current) drug therapy: Secondary | ICD-10-CM | POA: Diagnosis not present

## 2022-04-10 DIAGNOSIS — R002 Palpitations: Secondary | ICD-10-CM | POA: Diagnosis not present

## 2022-04-10 DIAGNOSIS — R600 Localized edema: Secondary | ICD-10-CM | POA: Diagnosis not present

## 2022-04-11 DIAGNOSIS — J208 Acute bronchitis due to other specified organisms: Secondary | ICD-10-CM | POA: Diagnosis not present

## 2022-04-18 DIAGNOSIS — M9905 Segmental and somatic dysfunction of pelvic region: Secondary | ICD-10-CM | POA: Diagnosis not present

## 2022-04-18 DIAGNOSIS — M5136 Other intervertebral disc degeneration, lumbar region: Secondary | ICD-10-CM | POA: Diagnosis not present

## 2022-04-18 DIAGNOSIS — M9902 Segmental and somatic dysfunction of thoracic region: Secondary | ICD-10-CM | POA: Diagnosis not present

## 2022-04-18 DIAGNOSIS — M9903 Segmental and somatic dysfunction of lumbar region: Secondary | ICD-10-CM | POA: Diagnosis not present

## 2022-05-02 DIAGNOSIS — M9905 Segmental and somatic dysfunction of pelvic region: Secondary | ICD-10-CM | POA: Diagnosis not present

## 2022-05-02 DIAGNOSIS — M9903 Segmental and somatic dysfunction of lumbar region: Secondary | ICD-10-CM | POA: Diagnosis not present

## 2022-05-02 DIAGNOSIS — M9902 Segmental and somatic dysfunction of thoracic region: Secondary | ICD-10-CM | POA: Diagnosis not present

## 2022-05-02 DIAGNOSIS — M5136 Other intervertebral disc degeneration, lumbar region: Secondary | ICD-10-CM | POA: Diagnosis not present

## 2022-05-07 DIAGNOSIS — K589 Irritable bowel syndrome without diarrhea: Secondary | ICD-10-CM | POA: Diagnosis not present

## 2022-05-09 DIAGNOSIS — M791 Myalgia, unspecified site: Secondary | ICD-10-CM | POA: Diagnosis not present

## 2022-05-09 DIAGNOSIS — M545 Low back pain, unspecified: Secondary | ICD-10-CM | POA: Diagnosis not present

## 2022-05-09 DIAGNOSIS — M79604 Pain in right leg: Secondary | ICD-10-CM | POA: Diagnosis not present

## 2022-05-10 DIAGNOSIS — I6789 Other cerebrovascular disease: Secondary | ICD-10-CM | POA: Diagnosis not present

## 2022-05-10 DIAGNOSIS — R7301 Impaired fasting glucose: Secondary | ICD-10-CM | POA: Diagnosis not present

## 2022-05-10 DIAGNOSIS — E785 Hyperlipidemia, unspecified: Secondary | ICD-10-CM | POA: Diagnosis not present

## 2022-05-16 DIAGNOSIS — M9903 Segmental and somatic dysfunction of lumbar region: Secondary | ICD-10-CM | POA: Diagnosis not present

## 2022-05-16 DIAGNOSIS — M9905 Segmental and somatic dysfunction of pelvic region: Secondary | ICD-10-CM | POA: Diagnosis not present

## 2022-05-16 DIAGNOSIS — M5136 Other intervertebral disc degeneration, lumbar region: Secondary | ICD-10-CM | POA: Diagnosis not present

## 2022-05-16 DIAGNOSIS — M9902 Segmental and somatic dysfunction of thoracic region: Secondary | ICD-10-CM | POA: Diagnosis not present

## 2022-05-30 DIAGNOSIS — M9903 Segmental and somatic dysfunction of lumbar region: Secondary | ICD-10-CM | POA: Diagnosis not present

## 2022-05-30 DIAGNOSIS — M9902 Segmental and somatic dysfunction of thoracic region: Secondary | ICD-10-CM | POA: Diagnosis not present

## 2022-05-30 DIAGNOSIS — M5136 Other intervertebral disc degeneration, lumbar region: Secondary | ICD-10-CM | POA: Diagnosis not present

## 2022-05-30 DIAGNOSIS — M9905 Segmental and somatic dysfunction of pelvic region: Secondary | ICD-10-CM | POA: Diagnosis not present

## 2022-06-14 DIAGNOSIS — R140 Abdominal distension (gaseous): Secondary | ICD-10-CM | POA: Diagnosis not present

## 2022-06-14 DIAGNOSIS — K219 Gastro-esophageal reflux disease without esophagitis: Secondary | ICD-10-CM | POA: Diagnosis not present

## 2022-06-14 DIAGNOSIS — R1310 Dysphagia, unspecified: Secondary | ICD-10-CM | POA: Diagnosis not present

## 2022-06-25 ENCOUNTER — Ambulatory Visit: Payer: Medicare HMO | Admitting: Cardiology

## 2022-07-04 DIAGNOSIS — M79604 Pain in right leg: Secondary | ICD-10-CM | POA: Diagnosis not present

## 2022-07-04 DIAGNOSIS — M9905 Segmental and somatic dysfunction of pelvic region: Secondary | ICD-10-CM | POA: Diagnosis not present

## 2022-07-04 DIAGNOSIS — M5136 Other intervertebral disc degeneration, lumbar region: Secondary | ICD-10-CM | POA: Diagnosis not present

## 2022-07-04 DIAGNOSIS — M9902 Segmental and somatic dysfunction of thoracic region: Secondary | ICD-10-CM | POA: Diagnosis not present

## 2022-07-04 DIAGNOSIS — M545 Low back pain, unspecified: Secondary | ICD-10-CM | POA: Diagnosis not present

## 2022-07-04 DIAGNOSIS — M9903 Segmental and somatic dysfunction of lumbar region: Secondary | ICD-10-CM | POA: Diagnosis not present

## 2022-07-13 DIAGNOSIS — R1310 Dysphagia, unspecified: Secondary | ICD-10-CM | POA: Diagnosis not present

## 2022-07-13 DIAGNOSIS — K219 Gastro-esophageal reflux disease without esophagitis: Secondary | ICD-10-CM | POA: Diagnosis not present

## 2022-07-13 DIAGNOSIS — I10 Essential (primary) hypertension: Secondary | ICD-10-CM | POA: Diagnosis not present

## 2022-07-13 DIAGNOSIS — K317 Polyp of stomach and duodenum: Secondary | ICD-10-CM | POA: Diagnosis not present

## 2022-07-13 DIAGNOSIS — K222 Esophageal obstruction: Secondary | ICD-10-CM | POA: Diagnosis not present

## 2022-07-13 DIAGNOSIS — K449 Diaphragmatic hernia without obstruction or gangrene: Secondary | ICD-10-CM | POA: Diagnosis not present

## 2022-07-18 DIAGNOSIS — H524 Presbyopia: Secondary | ICD-10-CM | POA: Diagnosis not present

## 2022-07-25 DIAGNOSIS — M9905 Segmental and somatic dysfunction of pelvic region: Secondary | ICD-10-CM | POA: Diagnosis not present

## 2022-07-25 DIAGNOSIS — M9903 Segmental and somatic dysfunction of lumbar region: Secondary | ICD-10-CM | POA: Diagnosis not present

## 2022-07-25 DIAGNOSIS — M5136 Other intervertebral disc degeneration, lumbar region: Secondary | ICD-10-CM | POA: Diagnosis not present

## 2022-07-25 DIAGNOSIS — M9902 Segmental and somatic dysfunction of thoracic region: Secondary | ICD-10-CM | POA: Diagnosis not present

## 2022-08-08 DIAGNOSIS — M9902 Segmental and somatic dysfunction of thoracic region: Secondary | ICD-10-CM | POA: Diagnosis not present

## 2022-08-08 DIAGNOSIS — M5136 Other intervertebral disc degeneration, lumbar region: Secondary | ICD-10-CM | POA: Diagnosis not present

## 2022-08-08 DIAGNOSIS — M9905 Segmental and somatic dysfunction of pelvic region: Secondary | ICD-10-CM | POA: Diagnosis not present

## 2022-08-08 DIAGNOSIS — M9903 Segmental and somatic dysfunction of lumbar region: Secondary | ICD-10-CM | POA: Diagnosis not present

## 2022-08-09 DIAGNOSIS — R7301 Impaired fasting glucose: Secondary | ICD-10-CM | POA: Diagnosis not present

## 2022-08-09 DIAGNOSIS — E785 Hyperlipidemia, unspecified: Secondary | ICD-10-CM | POA: Diagnosis not present

## 2022-08-09 DIAGNOSIS — I6789 Other cerebrovascular disease: Secondary | ICD-10-CM | POA: Diagnosis not present

## 2022-08-09 DIAGNOSIS — G8191 Hemiplegia, unspecified affecting right dominant side: Secondary | ICD-10-CM | POA: Diagnosis not present

## 2022-08-22 DIAGNOSIS — M9902 Segmental and somatic dysfunction of thoracic region: Secondary | ICD-10-CM | POA: Diagnosis not present

## 2022-08-22 DIAGNOSIS — M9903 Segmental and somatic dysfunction of lumbar region: Secondary | ICD-10-CM | POA: Diagnosis not present

## 2022-08-22 DIAGNOSIS — M76891 Other specified enthesopathies of right lower limb, excluding foot: Secondary | ICD-10-CM | POA: Diagnosis not present

## 2022-08-22 DIAGNOSIS — M5136 Other intervertebral disc degeneration, lumbar region: Secondary | ICD-10-CM | POA: Diagnosis not present

## 2022-08-22 DIAGNOSIS — M9905 Segmental and somatic dysfunction of pelvic region: Secondary | ICD-10-CM | POA: Diagnosis not present

## 2022-08-24 DIAGNOSIS — R509 Fever, unspecified: Secondary | ICD-10-CM | POA: Diagnosis not present

## 2022-08-24 DIAGNOSIS — J449 Chronic obstructive pulmonary disease, unspecified: Secondary | ICD-10-CM | POA: Diagnosis not present

## 2022-08-24 DIAGNOSIS — R059 Cough, unspecified: Secondary | ICD-10-CM | POA: Diagnosis not present

## 2022-08-24 DIAGNOSIS — R0981 Nasal congestion: Secondary | ICD-10-CM | POA: Diagnosis not present

## 2022-08-26 NOTE — Progress Notes (Deleted)
Cardiology Office Note:    Date:  08/26/2022   ID:  Lynn Rollins, DOB 1965-08-29, MRN [id number]  PCP:  Paulina Fusi, MD  Cardiologist:  Norman Herrlich, MD    Referring MD: Paulina Fusi, MD    ASSESSMENT:    No diagnosis found. PLAN:    In order of problems listed above:  ***   Next appointment: ***   Medication Adjustments/Labs and Tests Ordered: Current medicines are reviewed at length with the patient today.  Concerns regarding medicines are outlined above.  No orders of the defined types were placed in this encounter.  No orders of the defined types were placed in this encounter.   No chief complaint on file.   History of Present Illness:    [name] is a 57 y.o. female with a hx of hypertension hyperlipidemia lower extremity edema COPD and stroke with an implanted loop recorder last seen 03/21/2022.  Following the visit she had an gram performed showing left ventricular function normal EF 60 to 65% normal left atrial pressure normal right ventricular size and function no valvular abnormality in the inferior vena cava right atrial and venous pressures are normal.  As expected the proBNP level was low less than 36.  She had an event monitor reported 04/16/2022 showing the presence of atrial tachycardia longest episode was 11 complexes rate 137 bpm.  There were frequent symptomatic episodes that all were sinus rhythm and unassociated with arrhythmia.  Compliance with diet, lifestyle and medications: *** Past Medical History:  Diagnosis Date   Acute ischemic left ACA stroke (HCC)    Anxiety    Asthma    Brain lesion 2011-2012   MRI   Chronic fatigue    Chronic pain syndrome    Conversion disorder    COPD (chronic obstructive pulmonary disease) (HCC)    Degeneration of C5-C6 intervertebral disc    Depression    Eustachian tube dysfunction    Fibromyalgia    Generalized osteoarthritis of multiple sites    GERD (gastroesophageal reflux  disease)    H. pylori infection    Hearing loss    Hemorrhoid    Hiatal hernia with GERD    Hyperlipemia    Hypertension    Hypoglycemia    IBS (irritable bowel syndrome)    Migraine    Morbidly obese (HCC)    OSA on CPAP    Scalp psoriasis    Stroke (HCC)    Vitamin D deficiency     Past Surgical History:  Procedure Laterality Date   ABDOMINAL HYSTERECTOMY     CHOLECYSTECTOMY     COLONOSCOPY  04/28/2015   Mild sigmoid diverticulosis. Otherwise normal colonoscopy   ESOPHAGOGASTRODUODENOSCOPY  09/11/2013   Mild gastritis. Otherwise normal esophagogastroduodenoscopy   LOOP RECORDER INSERTION N/A 10/01/2016   Procedure: Loop Recorder Insertion;  Surgeon: Duke Salvia, MD;  Location: [facility] INVASIVE CV LAB;  Service: Cardiovascular;  Laterality: N/A;   LOOP RECORDER REMOVAL N/A 09/07/2020   Procedure: LOOP RECORDER REMOVAL;  Surgeon: Duke Salvia, MD;  Location: [facility] INVASIVE CV LAB;  Service: Cardiovascular;  Laterality: N/A;   OTHER SURGICAL HISTORY     screw in head. Bone anch Baha hearing aid   TEE WITHOUT CARDIOVERSION N/A 10/01/2016   Procedure: TRANSESOPHAGEAL ECHOCARDIOGRAM (TEE);  Surgeon: Elease Hashimoto Deloris Ping, MD;  Location: [facility] ENDOSCOPY;  Service: Cardiovascular;  Laterality: N/A;   TUBAL LIGATION      Current Medications: No outpatient medications have been marked as taking  for the 08/28/22 encounter (Appointment) with Baldo Daub, MD.     Allergies:   Celexa [citalopram hydrobromide], Tape, Cy Blamer [place] oleracea], and Statins   Social History   Socioeconomic History   Marital status: Married    Spouse name: Not on file   Number of children: Not on file   Years of education: Not on file   Highest education level: Not on file  Occupational History   Not on file  Tobacco Use   Smoking status: Never   Smokeless tobacco: Never  Vaping Use   Vaping Use: Never used  Substance and Sexual Activity   Alcohol use: No   Drug use: No   Sexual activity: Not on  file  Other Topics Concern   Not on file  Social History Narrative   Lives at home with Hessie Diener, husband.  She is disabled.  Education graduate.  2 children.  Caffeine 2 drinks per day.  (most).   Social Determinants of Health   Financial Resource Strain: Not on file  Food Insecurity: Not on file  Transportation Needs: Not on file  Physical Activity: Not on file  Stress: Not on file  Social Connections: Not on file     Family History: The patient's ***family history includes Alcohol abuse in her father; Asthma in her paternal grandmother; Diabetes in her maternal grandmother and paternal grandmother; Heart attack in her father, maternal grandmother, mother, and paternal grandmother; Heart disease in her father, maternal grandfather, maternal grandmother, paternal grandfather, and paternal grandmother; Hypertension in her father, maternal grandfather, maternal grandmother, mother, paternal grandfather, and paternal grandmother; Multiple sclerosis in her brother; Other in her father; Parkinson's disease in her father; Stroke in her father, maternal grandfather, maternal grandmother, mother, paternal grandfather, and paternal grandmother; Thyroid disease in her sister. There is no history of Esophageal cancer. ROS:   Please see the history of present illness.    All other systems reviewed and are negative.  EKGs/Labs/Other Studies Reviewed:    The following studies were reviewed today:  Cardiac Studies & Procedures       ECHOCARDIOGRAM  ECHOCARDIOGRAM COMPLETE 04/08/2022  Narrative ECHOCARDIOGRAM REPORT    Patient Name:   Lynn Rollins Date of Exam: 04/06/2022 Medical Rec #:  [id number]        Height:       59.0 in Accession #:    [id number]       Weight:       189.0 lb Date of Birth:  1965-11-02        BSA:          1.801 m Patient Age:    57 years         BP:           192/98 mmHg Patient Gender: F                HR:           69 bpm. Exam Location:  [facility]  Procedure: 2D  Echo, Cardiac Doppler and Color Doppler  Indications:    Bilateral lower extremity edema [R60.0]. Essential hypertension [I10]. Palpitations. COPD with asthma [J44.89] Other chronic pain [G89.29]  History:        Patient has prior history of Echocardiogram examinations, most recent 08/09/2017. Signs/Symptoms:Dyspnea.  Sonographer:    Margreta Journey RDCS Referring Phys: [id number] Janalynn Eder J [facility]   Sonographer Comments: Technically difficult study due to poor echo windows. Image acquisition challenging due to patient body habitus. Global longitudinal strain was  attempted. Restricted mobility. Breast intertrigo. IMPRESSIONS   1. TDS and limited study. Mild ASH noted, no LVOT gradient. Left ventricular ejection fraction, by estimation, is 60 to 65%. The left ventricle has normal function. The left ventricle has no regional wall motion abnormalities. Left ventricular diastolic parameters are consistent with Grade I diastolic dysfunction (impaired relaxation). 2. Right ventricular systolic function is normal. The right ventricular size is normal. 3. The mitral valve is normal in structure. No evidence of mitral valve regurgitation. No evidence of mitral stenosis. 4. The aortic valve is normal in structure. Aortic valve regurgitation is not visualized. No aortic stenosis is present. 5. The inferior vena cava is normal in size with greater than 50% respiratory variability, suggesting right atrial pressure of 3 mmHg.  FINDINGS Left Ventricle: TDS and limited study. Mild ASH noted, no LVOT gradient. Left ventricular ejection fraction, by estimation, is 60 to 65%. The left ventricle has normal function. The left ventricle has no regional wall motion abnormalities. The left ventricular internal cavity size was normal in size. There is no left ventricular hypertrophy. Left ventricular diastolic parameters are consistent with Grade I diastolic dysfunction (impaired relaxation).  Right Ventricle: The right  ventricular size is normal. No increase in right ventricular wall thickness. Right ventricular systolic function is normal.  Left Atrium: Left atrial size was normal in size.  Right Atrium: Right atrial size was normal in size.  Pericardium: There is no evidence of pericardial effusion.  Mitral Valve: The mitral valve is normal in structure. No evidence of mitral valve regurgitation. No evidence of mitral valve stenosis.  Tricuspid Valve: The tricuspid valve is normal in structure. Tricuspid valve regurgitation is not demonstrated. No evidence of tricuspid stenosis.  Aortic Valve: The aortic valve is normal in structure. Aortic valve regurgitation is not visualized. No aortic stenosis is present.  Pulmonic Valve: The pulmonic valve was normal in structure. Pulmonic valve regurgitation is not visualized. No evidence of pulmonic stenosis.  Aorta: The aortic root is normal in size and structure.  Venous: The inferior vena cava is normal in size with greater than 50% respiratory variability, suggesting right atrial pressure of 3 mmHg.  IAS/Shunts: No atrial level shunt detected by color flow Doppler.   LEFT VENTRICLE PLAX 2D LVIDd:         3.70 cm   Diastology LVIDs:         3.10 cm   LV e' medial:    5.44 cm/s LV PW:         1.10 cm   LV E/e' medial:  12.4 LV IVS:        1.30 cm   LV e' lateral:   6.20 cm/s LVOT diam:     1.90 cm   LV E/e' lateral: 10.9 LV SV:         47 LV SV Index:   26 LVOT Area:     2.84 cm   LEFT ATRIUM           Index LA diam:      3.00 cm 1.67 cm/m LA Vol (A4C): 60.6 ml 33.66 ml/m AORTIC VALVE LVOT Vmax:   77.00 cm/s LVOT Vmean:  54.000 cm/s LVOT VTI:    0.165 m  AORTA Ao Root diam: 3.00 cm Ao Asc diam:  2.70 cm Ao Desc diam: 1.90 cm  MITRAL VALVE MV Area (PHT): 3.08 cm    SHUNTS MV Decel Time: 246 msec    Systemic VTI:  0.16 m MV E velocity: 67.70 cm/s  Systemic Diam: 1.90 cm MV A velocity: 81.00 cm/s MV E/A ratio:  0.84  Gypsy Balsam MD Electronically signed by Gypsy Balsam MD Signature Date/Time: 04/08/2022/11:40:46 AM    Final   TEE  ECHO TEE 10/01/2016  Narrative *[facility]* *[facility]* [address] [place], [place] [address] [phone]  ------------------------------------------------------------------- Transesophageal Echocardiography  Patient:    Aashi, Derrington MR #:       [id number] Study Date: 10/01/2016 Gender:     F Age:        50 Height:     147.3 cm Weight:     81.4 kg BSA:        1.87 m^2 Pt. Status: Room:       5C18C  PERFORMING   Kristeen Miss, M.D. ATTENDING    Hongalgi, Azucena Kuba REFERRING    Arty Baumgartner SONOGRAPHER  Dance, Tiffany  cc:  ------------------------------------------------------------------- LV EF: 60% -   65%  ------------------------------------------------------------------- Indications:      CVA 436.  ------------------------------------------------------------------- Study Conclusions  - Left ventricle: The cavity size was normal. Wall thickness was normal. Systolic function was normal. The estimated ejection fraction was in the range of 60% to 65%. - Aortic valve: No evidence of vegetation. - Left atrium: No evidence of thrombus in the atrial cavity or appendage. - Atrial septum: No defect or patent foramen ovale was identified.  ------------------------------------------------------------------- Study data:   Study status:  Routine.  Consent:  The risks, benefits, and alternatives to the procedure and sedation were explained to the patient and informed consent was obtained. Procedure:  The patient reported no pain pre or post test. Initial setup. The patient was brought to the laboratory. Surface ECG leads were monitored. Sedation. Conscious sedation was administered. Transesophageal echocardiography. An adult multiplane transesophageal  probe was inserted by the attending cardiologistwithout difficulty. Image quality was adequate.  Study completion:  The patient tolerated the procedure well. There were no complications.  Administered medications:   Fentanyl, , IV. Midazolam, 7mg , IV.          Diagnostic transesophageal echocardiography.  2D and color Doppler.  Birthdate:  Patient birthdate: July 12, 1965.  Age:  Patient is 57 yr old.  Sex:  Gender: female.    BMI: 37.5 kg/m^2.  Blood pressure:     156/70  Patient status:  Inpatient.  Study date:  Study date: 10/01/2016. Study time: 01:57 PM.  Location:  Endoscopy.  -------------------------------------------------------------------  ------------------------------------------------------------------- Left ventricle:  The cavity size was normal. Wall thickness was normal. Systolic function was normal. The estimated ejection fraction was in the range of 60% to 65%.  ------------------------------------------------------------------- Aortic valve:   Structurally normal valve.   Cusp separation was normal.  No evidence of vegetation.  Doppler:  There was no regurgitation.  ------------------------------------------------------------------- Aorta:  The aorta was mildly calcified.  ------------------------------------------------------------------- Left atrium:   No evidence of thrombus in the atrial cavity or appendage.  ------------------------------------------------------------------- Atrial septum:  No defect or patent foramen ovale was identified.  ------------------------------------------------------------------- Post procedure conclusions Ascending Aorta:  - The aorta was mildly calcified.  ------------------------------------------------------------------- Prepared and Electronically Authenticated by  Kristeen Miss, M.D. 2018-06-04T17:52:48   MONITORS  LONG TERM MONITOR (3-14 DAYS) 04/16/2022  Narrative Patch Wear Time:  8 days and 10 hours  (2023-11-22T14:59:39-0500 to 2023-12-04T17:29:10-498)  Monitor 1 Patient had a min HR of 51 bpm, max HR of 100 bpm, and avg HR of 67  bpm. Predominant underlying rhythm was Sinus Rhythm. Isolated SVEs were rare (<1.0%), and no SVE Couplets or SVE Triplets were present. Isolated VEs were rare (<1.0%), and no VE Couplets or VE Triplets were present.  Monitor 2 Patient had a min HR of 52 bpm, max HR of 158 bpm, and avg HR of 66 bpm. Predominant underlying rhythm was Sinus Rhythm. 2 Supraventricular Tachycardia runs occurred, both paroxysmal atrial tachycardia, the run with the fastest interval lasting 11 beats with a max rate of 158 bpm (avg 137 bpm); the run with the fastest interval was also the longest. Junctional Rhythm was present. Junctional Rhythm was detected within +/- 45 seconds of symptomatic patient event(s). Isolated SVEs were rare (<1.0%), SVE Couplets were rare (<1.0%), and no SVE Triplets were present. Isolated VEs were rare (<1.0%), and no VE Couplets or VE Triplets were present.  There was a total of 73 symptomatic events some say junctional however I can see P waves and all are sinus rhythm and unassociated with arrhythmia. Both monitor showed no significant sustained arrhythmia No atrial fibrillation or flutter no episodes of sinus pause second or third-degree AV nodal block. The strips noted to be junctional on my review show P waves.           EKG:  EKG ordered today and personally reviewed.  The ekg ordered today demonstrates ***  Recent Labs: 03/21/2022: NT-Pro BNP <36  Recent Lipid Panel    Component Value Date/Time   CHOL 124 08/09/2017 0622   TRIG 203 (H) 08/09/2017 0622   HDL 31 (L) 08/09/2017 0622   CHOLHDL 4.0 08/09/2017 0622   VLDL 41 (H) 08/09/2017 0622   LDLCALC 52 08/09/2017 0622    Physical Exam:    VS:  There were no vitals taken for this visit.    Wt Readings from Last 3 Encounters:  03/21/22 189 lb (85.7 kg)  03/06/22 186 lb (84.4 kg)   09/07/20 172 lb (78 kg)     GEN: *** Well nourished, well developed in no acute distress HEENT: Normal NECK: No JVD; No carotid bruits LYMPHATICS: No lymphadenopathy CARDIAC: ***RRR, no murmurs, rubs, gallops RESPIRATORY:  Clear to auscultation without rales, wheezing or rhonchi  ABDOMEN: Soft, non-tender, non-distended MUSCULOSKELETAL:  No edema; No deformity  SKIN: Warm and dry NEUROLOGIC:  Alert and oriented x 3 PSYCHIATRIC:  Normal affect    Signed, Norman Herrlich, MD  08/26/2022 7:01 PM    [facility]

## 2022-08-28 ENCOUNTER — Ambulatory Visit: Payer: Medicare HMO | Admitting: Cardiology

## 2022-09-02 DIAGNOSIS — R059 Cough, unspecified: Secondary | ICD-10-CM | POA: Diagnosis not present

## 2022-09-02 DIAGNOSIS — B370 Candidal stomatitis: Secondary | ICD-10-CM | POA: Diagnosis not present

## 2022-09-05 DIAGNOSIS — M5136 Other intervertebral disc degeneration, lumbar region: Secondary | ICD-10-CM | POA: Diagnosis not present

## 2022-09-05 DIAGNOSIS — M9902 Segmental and somatic dysfunction of thoracic region: Secondary | ICD-10-CM | POA: Diagnosis not present

## 2022-09-05 DIAGNOSIS — M9905 Segmental and somatic dysfunction of pelvic region: Secondary | ICD-10-CM | POA: Diagnosis not present

## 2022-09-05 DIAGNOSIS — M9903 Segmental and somatic dysfunction of lumbar region: Secondary | ICD-10-CM | POA: Diagnosis not present

## 2022-09-14 DIAGNOSIS — J4540 Moderate persistent asthma, uncomplicated: Secondary | ICD-10-CM | POA: Diagnosis not present

## 2022-09-14 DIAGNOSIS — G4733 Obstructive sleep apnea (adult) (pediatric): Secondary | ICD-10-CM | POA: Diagnosis not present

## 2022-09-26 ENCOUNTER — Telehealth: Payer: Self-pay | Admitting: Cardiology

## 2022-09-26 ENCOUNTER — Telehealth: Payer: Medicare HMO | Admitting: Physician Assistant

## 2022-09-26 NOTE — Progress Notes (Deleted)
Cardiology Office Note:    Date:  09/26/2022   ID:  Lynn Rollins, DOB Feb 01, 1966, MRN [id number]  PCP:  Paulina Fusi, MD   [facility] Providers Cardiologist:  None { Click to update primary MD,subspecialty MD or APP then REFRESH:1}    Referring MD: Paulina Fusi, MD   No chief complaint on file. ***  History of Present Illness:    Lynn Rollins is a 57 y.o. female with a hx of hypertension, CVA, migraine, COPD, OSA on CPAP, GERD, IBS, chronic pain, hyperlipidemia, depression, anxiety.  Previously had an ILR that was removed in 2022 secondary to cryptogenic stroke.  Most recently she was evaluated by Dr. Dulce Sellar on 03/21/2022 at this time her blood pressure was very elevated at 192/98, she was experiencing bilateral lower extremity edema.  ProBNP was 36.  An echocardiogram was arranged and completed on 04/06/2022 which revealed an EF of 60 to 65%, grade 1 DD, no valvular abnormalities.  A monitor was arranged which showed predominant underlying rhythm was sinus.   Past Medical History:  Diagnosis Date   Acute ischemic left ACA stroke (HCC)    Anxiety    Asthma    Brain lesion 2011-2012   MRI   Chronic fatigue    Chronic pain syndrome    Conversion disorder    COPD (chronic obstructive pulmonary disease) (HCC)    Degeneration of C5-C6 intervertebral disc    Depression    Eustachian tube dysfunction    Fibromyalgia    Generalized osteoarthritis of multiple sites    GERD (gastroesophageal reflux disease)    H. pylori infection    Hearing loss    Hemorrhoid    Hiatal hernia with GERD    Hyperlipemia    Hypertension    Hypoglycemia    IBS (irritable bowel syndrome)    Migraine    Morbidly obese (HCC)    OSA on CPAP    Scalp psoriasis    Stroke (HCC)    Vitamin D deficiency     Past Surgical History:  Procedure Laterality Date   ABDOMINAL HYSTERECTOMY     CHOLECYSTECTOMY     COLONOSCOPY  04/28/2015   Mild sigmoid diverticulosis.  Otherwise normal colonoscopy   ESOPHAGOGASTRODUODENOSCOPY  09/11/2013   Mild gastritis. Otherwise normal esophagogastroduodenoscopy   LOOP RECORDER INSERTION N/A 10/01/2016   Procedure: Loop Recorder Insertion;  Surgeon: Duke Salvia, MD;  Location: [facility] INVASIVE CV LAB;  Service: Cardiovascular;  Laterality: N/A;   LOOP RECORDER REMOVAL N/A 09/07/2020   Procedure: LOOP RECORDER REMOVAL;  Surgeon: Duke Salvia, MD;  Location: [facility] INVASIVE CV LAB;  Service: Cardiovascular;  Laterality: N/A;   OTHER SURGICAL HISTORY     screw in head. Bone anch B[place] hearing aid   TEE WITHOUT CARDIOVERSION N/A 10/01/2016   Procedure: TRANSESOPHAGEAL ECHOCARDIOGRAM (TEE);  Surgeon: Elease Hashimoto Deloris Ping, MD;  Location: [facility] ENDOSCOPY;  Service: Cardiovascular;  Laterality: N/A;   TUBAL LIGATION      Current Medications: No outpatient medications have been marked as taking for the 09/27/22 encounter (Appointment) with Flossie Dibble, NP.     Allergies:   Celexa [citalopram hydrobromide], Tape, Broccoli [brassica oleracea], and Statins   Social History   Socioeconomic History   Marital status: Married    Spouse name: Not on file   Number of children: Not on file   Years of education: Not on file   Highest education level: Not on file  Occupational History   Not on  file  Tobacco Use   Smoking status: Never   Smokeless tobacco: Never  Vaping Use   Vaping Use: Never used  Substance and Sexual Activity   Alcohol use: No   Drug use: No   Sexual activity: Not on file  Other Topics Concern   Not on file  Social History Narrative   Lives at home with Hessie Diener, husband.  She is disabled.  Education graduate.  2 children.  Caffeine 2 drinks per day.  (most).   Social Determinants of Health   Financial Resource Strain: Not on file  Food Insecurity: Not on file  Transportation Needs: Not on file  Physical Activity: Not on file  Stress: Not on file  Social Connections: Not on file     Family History: The  patient's ***family history includes Alcohol abuse in her father; Asthma in her paternal grandmother; Diabetes in her maternal grandmother and paternal grandmother; Heart attack in her father, maternal grandmother, mother, and paternal grandmother; Heart disease in her father, maternal grandfather, maternal grandmother, paternal grandfather, and paternal grandmother; Hypertension in her father, maternal grandfather, maternal grandmother, mother, paternal grandfather, and paternal grandmother; Multiple sclerosis in her brother; Other in her father; Parkinson's disease in her father; Stroke in her father, maternal grandfather, maternal grandmother, mother, paternal grandfather, and paternal grandmother; Thyroid disease in her sister. There is no history of Esophageal cancer.  ROS:   Please see the history of present illness.    *** All other systems reviewed and are negative.  EKGs/Labs/Other Studies Reviewed:    The following studies were reviewed today: ***  EKG:  EKG is *** ordered today.  The ekg ordered today demonstrates ***  Recent Labs: 03/21/2022: NT-Pro BNP <36  Recent Lipid Panel    Component Value Date/Time   CHOL 124 08/09/2017 0622   TRIG 203 (H) 08/09/2017 0622   HDL 31 (L) 08/09/2017 0622   CHOLHDL 4.0 08/09/2017 0622   VLDL 41 (H) 08/09/2017 0622   LDLCALC 52 08/09/2017 0622     Risk Assessment/Calculations:   {Does this patient have ATRIAL FIBRILLATION?[phone]}  No BP recorded.  {Refresh Note OR Click here to enter BP  :1}***         Physical Exam:    VS:  There were no vitals taken for this visit.    Wt Readings from Last 3 Encounters:  03/21/22 189 lb (85.7 kg)  03/06/22 186 lb (84.4 kg)  09/07/20 172 lb (78 kg)     GEN: *** Well nourished, well developed in no acute distress HEENT: Normal NECK: No JVD; No carotid bruits LYMPHATICS: No lymphadenopathy CARDIAC: ***RRR, no murmurs, rubs, gallops RESPIRATORY:  Clear to auscultation without rales,  wheezing or rhonchi  ABDOMEN: Soft, non-tender, non-distended MUSCULOSKELETAL:  No edema; No deformity  SKIN: Warm and dry NEUROLOGIC:  Alert and oriented x 3 PSYCHIATRIC:  Normal affect   ASSESSMENT:    1. Bilateral lower extremity edema   2. Essential hypertension   3. Palpitations    PLAN:    In order of problems listed above:  ***      {Are you ordering a CV Procedure (e.g. stress test, cath, DCCV, TEE, etc)?   Press F2        :[id number]}    Medication Adjustments/Labs and Tests Ordered: Current medicines are reviewed at length with the patient today.  Concerns regarding medicines are outlined above.  No orders of the defined types were placed in this encounter.  No orders of the defined types  were placed in this encounter.   There are no Patient Instructions on file for this visit.   Signed, Flossie Dibble, NP  09/26/2022 3:48 PM    [facility]

## 2022-09-26 NOTE — Telephone Encounter (Signed)
Patient is calling requesting her appointment for tomorrow, 05/30 with Wallis Bamberg be made virtual due to still not feeling well.  Please advise.

## 2022-09-26 NOTE — Progress Notes (Signed)
Patient trying to reach her Cardiology office. Thought she was scheduling visit with them. Is scheduled for in office follow-up tomorrow with Wallis Bamberg, FNP. Had called this morning to see about having it changed to a video follow-up versus rescheduling and was never contacted back. Is unsure of what to do.   Sent direct message to FNP to make her aware so she can help get staff to assist patient

## 2022-09-27 ENCOUNTER — Ambulatory Visit: Payer: Medicare HMO | Admitting: Cardiology

## 2022-09-27 DIAGNOSIS — R600 Localized edema: Secondary | ICD-10-CM

## 2022-09-27 DIAGNOSIS — R002 Palpitations: Secondary | ICD-10-CM

## 2022-09-27 DIAGNOSIS — I10 Essential (primary) hypertension: Secondary | ICD-10-CM

## 2022-09-27 NOTE — Telephone Encounter (Signed)
Spoke with pt who states that she did not make this appointment and that it was made without her knowledge. Pt states that she has been having breathing issues due to her asthma and she was having abd issues with bloating and multiple bathroom trips. Pt states that she is happy to reschedule the appointment. Denies any issue with chest pain and that her shortness of breath is her asthma and not cardiac. Pt verbalized understanding and had no additional questions.

## 2022-09-27 NOTE — Telephone Encounter (Signed)
Called patient to r/s as she was not feeling well. Patient r/s for 06/10.   No further action needed.  Kbl 09/27/22

## 2022-10-01 ENCOUNTER — Other Ambulatory Visit: Payer: Self-pay

## 2022-10-01 DIAGNOSIS — M797 Fibromyalgia: Secondary | ICD-10-CM | POA: Insufficient documentation

## 2022-10-01 DIAGNOSIS — E162 Hypoglycemia, unspecified: Secondary | ICD-10-CM | POA: Insufficient documentation

## 2022-10-01 DIAGNOSIS — I639 Cerebral infarction, unspecified: Secondary | ICD-10-CM | POA: Insufficient documentation

## 2022-10-01 DIAGNOSIS — G4733 Obstructive sleep apnea (adult) (pediatric): Secondary | ICD-10-CM | POA: Diagnosis not present

## 2022-10-01 DIAGNOSIS — F449 Dissociative and conversion disorder, unspecified: Secondary | ICD-10-CM | POA: Insufficient documentation

## 2022-10-01 DIAGNOSIS — G939 Disorder of brain, unspecified: Secondary | ICD-10-CM | POA: Insufficient documentation

## 2022-10-01 DIAGNOSIS — K219 Gastro-esophageal reflux disease without esophagitis: Secondary | ICD-10-CM | POA: Insufficient documentation

## 2022-10-01 DIAGNOSIS — M159 Polyosteoarthritis, unspecified: Secondary | ICD-10-CM | POA: Insufficient documentation

## 2022-10-01 DIAGNOSIS — J4540 Moderate persistent asthma, uncomplicated: Secondary | ICD-10-CM | POA: Diagnosis not present

## 2022-10-08 ENCOUNTER — Ambulatory Visit: Payer: Medicare HMO | Attending: Cardiology | Admitting: Cardiology

## 2022-10-08 ENCOUNTER — Encounter: Payer: Self-pay | Admitting: Cardiology

## 2022-10-08 VITALS — BP 138/84 | HR 78 | Ht <= 58 in | Wt 197.6 lb

## 2022-10-08 DIAGNOSIS — G4733 Obstructive sleep apnea (adult) (pediatric): Secondary | ICD-10-CM | POA: Diagnosis not present

## 2022-10-08 DIAGNOSIS — Z6841 Body Mass Index (BMI) 40.0 and over, adult: Secondary | ICD-10-CM

## 2022-10-08 DIAGNOSIS — I10 Essential (primary) hypertension: Secondary | ICD-10-CM

## 2022-10-08 DIAGNOSIS — R600 Localized edema: Secondary | ICD-10-CM

## 2022-10-08 DIAGNOSIS — R002 Palpitations: Secondary | ICD-10-CM

## 2022-10-08 DIAGNOSIS — I63513 Cerebral infarction due to unspecified occlusion or stenosis of bilateral middle cerebral arteries: Secondary | ICD-10-CM

## 2022-10-08 NOTE — Patient Instructions (Signed)
Medication Instructions:  Your physician recommends that you continue on your current medications as directed. Please refer to the Current Medication list given to you today.  *If you need a refill on your cardiac medications before your next appointment, please call your pharmacy*   Lab Work: None ordered If you have labs (blood work) drawn today and your tests are completely normal, you will receive your results only by: MyChart Message (if you have MyChart) OR A paper copy in the mail If you have any lab test that is abnormal or we need to change your treatment, we will call you to review the results.   Testing/Procedures: None ordered   Follow-Up: At [facility], you and your health needs are our priority.  As part of our continuing mission to provide you with exceptional heart care, we have created designated Provider Care Teams.  These Care Teams include your primary Cardiologist (physician) and Advanced Practice Providers (APPs -  Physician Assistants and Nurse Practitioners) who all work together to provide you with the care you need, when you need it.  We recommend signing up for the patient portal called "MyChart".  Sign up information is provided on this After Visit Summary.  MyChart is used to connect with patients for Virtual Visits (Telemedicine).  Patients are able to view lab/test results, encounter notes, upcoming appointments, etc.  Non-urgent messages can be sent to your provider as well.   To learn more about what you can do with MyChart, go to [email or web address].    Your next appointment:   6 month(s)  The format for your next appointment:   In Person  Provider:   Brian Munley, MD or Jennifer Woody, NP ([place])    Other Instructions none  Important Information About Sugar      

## 2022-10-08 NOTE — Progress Notes (Signed)
Cardiology Office Note:    Date:  10/08/2022   ID:  SHASTELYN PEMBLE, DOB 1965-10-01, MRN [id number]  PCP:  Paulina Fusi, MD   [facility] Providers Cardiologist:  None     Referring MD: Paulina Fusi, MD   CC: follow up   History of Present Illness:    KIMMY TERZIC is a 57 y.o. female with a hx of hypertension, CVA, migraine, OSA on CPAP, GERD, IBS, chronic pain, hyperlipidemia, depression, anxiety.  Previously had an ILR that was removed in 2022 secondary to cryptogenic stroke.  Most recently she was evaluated by Dr. Dulce Sellar on 03/21/2022 at this time her blood pressure was very elevated at 192/98, she was experiencing bilateral lower extremity edema.  ProBNP was 36.  An echocardiogram was arranged and completed on 04/06/2022 which revealed an EF of 60 to 65%, grade 1 DD, no valvular abnormalities.  A monitor was arranged which showed predominant underlying rhythm was sinus.   She presents today for follow-up of her hypertension.  She states she is doing well from a cardiac perspective, her biggest complaint is weight gain, she states she has gained 30 pounds over the last 6 months.  She is perplexed as to what could be causing her weight gain.  We did discuss her diet in some detail, she eats a fair amount of carbohydrates and highly processed foods.  She is currently in the process of working with pulmonology for CPAP titration.  She does not participate in any formal exercise, although wants to start being active again. She denies chest pain, palpitations, dyspnea, pnd, orthopnea, n, v, dizziness, syncope, edema, weight gain, or early satiety.   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 pylori infection    Hearing loss of left ear 09/09/2017   Hemorrhoid    Hiatal hernia with GERD    History of loop recorder 08/25/2020   History of stroke    Hyperlipemia    Hypoglycemia    IBS (irritable bowel syndrome)    Migraine    Morbid obesity (HCC)    OSA on CPAP    Scalp psoriasis    Sensorineural hearing loss (SNHL) of left ear with unrestricted hearing of right ear 09/26/2017   Formatting of this note might be different from the original. Onset 07/2017 with CVA.  MRI brain just showed old infarcts. Last Assessment & Plan: Formatting of this note might be different from the original. Follow-up sudden left sensorineural hearing loss. Onset back in April.  She was treated at [facility] for stroke.  She was given steroids.  Unfortunately hearing is still very bad.  Some unste   Stroke [facility])    Vitamin D deficiency     Past Surgical History:  Procedure Laterality Date   ABDOMINAL HYSTERECTOMY     CHOLECYSTECTOMY     COLONOSCOPY  04/28/2015  Mild sigmoid diverticulosis. Otherwise normal colonoscopy   ESOPHAGOGASTRODUODENOSCOPY  09/11/2013   Mild gastritis. Otherwise normal esophagogastroduodenoscopy   LOOP RECORDER INSERTION N/A 10/01/2016   Procedure: Loop Recorder Insertion;  Surgeon: Duke Salvia, MD;  Location: [facility] INVASIVE CV LAB;  Service: Cardiovascular;  Laterality: N/A;   LOOP RECORDER REMOVAL N/A 09/07/2020   Procedure: LOOP RECORDER REMOVAL;  Surgeon: Duke Salvia, MD;  Location: [facility] INVASIVE CV LAB;  Service: Cardiovascular;  Laterality: N/A;   OTHER SURGICAL HISTORY     screw in head. Bone anch [place] hearing aid   TEE WITHOUT CARDIOVERSION N/A 10/01/2016   Procedure:  TRANSESOPHAGEAL ECHOCARDIOGRAM (TEE);  Surgeon: Vesta Mixer, MD;  Location: [facility] ENDOSCOPY;  Service: Cardiovascular;  Laterality: N/A;   TUBAL LIGATION      Current Medications: Current Meds  Medication Sig   albuterol (PROVENTIL HFA;VENTOLIN HFA) 108 (90 BASE) MCG/ACT inhaler Inhale 2 puffs into the lungs every 4 (four) hours as needed for wheezing.    benzonatate (TESSALON) 200 MG capsule Take 200 mg by mouth 3 (three) times daily as needed for cough.   calcipotriene (DOVONOX) 0.005 % cream Apply 1 Application topically 2 (two) times daily.   clobetasol (TEMOVATE) 0.05 % external solution Apply 1 Application topically 2 (two) times daily.   clopidogrel (PLAVIX) 75 MG tablet Take 75 mg by mouth daily.   DEXILANT 60 MG capsule Take 60 mg by mouth daily.   dicyclomine (BENTYL) 20 MG tablet Take 20 mg by mouth 4 (four) times daily.   famotidine (PEPCID) 40 MG tablet Take 1 tablet by mouth daily.   Fluticasone-Umeclidin-Vilant (TRELEGY ELLIPTA) 200-62.5-25 MCG/ACT AEPB Inhale 1 puff into the lungs daily.   furosemide (LASIX) 40 MG tablet Take 40 mg by mouth daily.   gabapentin (NEURONTIN) 600 MG tablet Take 600 mg by mouth 3 (three) times daily.   ipratropium-albuterol (DUONEB) 0.5-2.5 (3) MG/3ML SOLN Inhale 3 mLs into the lungs every 6 (six) hours as needed (for shortness of breath or wheezing).    lisinopril (PRINIVIL,ZESTRIL) 40 MG tablet Take 40 mg by mouth daily.   montelukast (SINGULAIR) 10 MG tablet Take 10 mg by mouth daily.   Omega-3 Fatty Acids (FISH OIL) 1000 MG CAPS Take 1,000 mg by mouth 2 (two) times daily.   OXYCONTIN 10 MG 12 hr tablet Take 10 mg by mouth QID.    temazepam (RESTORIL) 30 MG capsule Take 30 mg by mouth at bedtime as needed for sleep.   tolterodine (DETROL LA) 4 MG 24 hr capsule Take 4 mg by mouth daily.   triamcinolone cream (KENALOG) 0.1 % Apply 1 Application topically 2 (two) times daily.   Vitamin D, Ergocalciferol, (DRISDOL) 50000 units CAPS capsule Take  50,000 Units by mouth every Saturday.      Allergies:   Celexa [citalopram hydrobromide], Tape, Broccoli [brassica oleracea], and Statins   Social History   Socioeconomic History   Marital status: Married    Spouse name: Not on file   Number of children: Not on file   Years of education: Not on file   Highest education level: Not on file  Occupational History   Not on file  Tobacco Use   Smoking status: Never   Smokeless tobacco: Never  Vaping Use   Vaping Use: Never used  Substance and Sexual Activity   Alcohol use: No   Drug use: No   Sexual activity: Not on file  Other Topics Concern   Not on file  Social History Narrative  Lives at home with Hessie Diener, husband.  She is disabled.  Education graduate.  2 children.  Caffeine 2 drinks per day.  (most).   Social Determinants of Health   Financial Resource Strain: Not on file  Food Insecurity: Not on file  Transportation Needs: Not on file  Physical Activity: Not on file  Stress: Not on file  Social Connections: Not on file     Family History: The patient's family history includes Alcohol abuse in her father; Asthma in her paternal grandmother; Diabetes in her maternal grandmother and paternal grandmother; Heart attack in her father, maternal grandmother, mother, and paternal grandmother; Heart disease in her father, maternal grandfather, maternal grandmother, paternal grandfather, and paternal grandmother; Hypertension in her father, maternal grandfather, maternal grandmother, mother, paternal grandfather, and paternal grandmother; Multiple sclerosis in her brother; Other in her father; Parkinson's disease in her father; Stroke in her father, maternal grandfather, maternal grandmother, mother, paternal grandfather, and paternal grandmother; Thyroid disease in her sister. There is no history of Esophageal cancer.  ROS:   Please see the history of present illness.    All other systems reviewed and are negative.  EKGs/Labs/Other  Studies Reviewed:    The following studies were reviewed today: Cardiac Studies & Procedures       ECHOCARDIOGRAM  ECHOCARDIOGRAM COMPLETE 04/08/2022  Narrative ECHOCARDIOGRAM REPORT    Patient Name:   GISEL DEWINTER Date of Exam: 04/06/2022 Medical Rec #:  [id number]        Height:       59.0 in Accession #:    [id number]       Weight:       189.0 lb Date of Birth:  1965-06-03        BSA:          1.801 m Patient Age:    56 years         BP:           192/98 mmHg Patient Gender: F                HR:           69 bpm. Exam Location:  [place]  Procedure: 2D Echo, Cardiac Doppler and Color Doppler  Indications:    Bilateral lower extremity edema [R60.0]. Essential hypertension [I10]. Palpitations. COPD with asthma [J44.89] Other chronic pain [G89.29]  History:        Patient has prior history of Echocardiogram examinations, most recent 08/09/2017. Signs/Symptoms:Dyspnea.  Sonographer:    Margreta Journey RDCS Referring Phys: [id number] BRIAN J [facility]   Sonographer Comments: Technically difficult study due to poor echo windows. Image acquisition challenging due to patient body habitus. Global longitudinal strain was attempted. Restricted mobility. Breast intertrigo. IMPRESSIONS   1. TDS and limited study. Mild ASH noted, no LVOT gradient. Left ventricular ejection fraction, by estimation, is 60 to 65%. The left ventricle has normal function. The left ventricle has no regional wall motion abnormalities. Left ventricular diastolic parameters are consistent with Grade I diastolic dysfunction (impaired relaxation). 2. Right ventricular systolic function is normal. The right ventricular size is normal. 3. The mitral valve is normal in structure. No evidence of mitral valve regurgitation. No evidence of mitral stenosis. 4. The aortic valve is normal in structure. Aortic valve regurgitation is not visualized. No aortic stenosis is present. 5. The inferior vena cava is normal in size  with greater than 50% respiratory variability, suggesting right atrial pressure of 3 mmHg.  FINDINGS Left Ventricle: TDS and limited study.  Mild ASH noted, no LVOT gradient. Left ventricular ejection fraction, by estimation, is 60 to 65%. The left ventricle has normal function. The left ventricle has no regional wall motion abnormalities. The left ventricular internal cavity size was normal in size. There is no left ventricular hypertrophy. Left ventricular diastolic parameters are consistent with Grade I diastolic dysfunction (impaired relaxation).  Right Ventricle: The right ventricular size is normal. No increase in right ventricular wall thickness. Right ventricular systolic function is normal.  Left Atrium: Left atrial size was normal in size.  Right Atrium: Right atrial size was normal in size.  Pericardium: There is no evidence of pericardial effusion.  Mitral Valve: The mitral valve is normal in structure. No evidence of mitral valve regurgitation. No evidence of mitral valve stenosis.  Tricuspid Valve: The tricuspid valve is normal in structure. Tricuspid valve regurgitation is not demonstrated. No evidence of tricuspid stenosis.  Aortic Valve: The aortic valve is normal in structure. Aortic valve regurgitation is not visualized. No aortic stenosis is present.  Pulmonic Valve: The pulmonic valve was normal in structure. Pulmonic valve regurgitation is not visualized. No evidence of pulmonic stenosis.  Aorta: The aortic root is normal in size and structure.  Venous: The inferior vena cava is normal in size with greater than 50% respiratory variability, suggesting right atrial pressure of 3 mmHg.  IAS/Shunts: No atrial level shunt detected by color flow Doppler.   LEFT VENTRICLE PLAX 2D LVIDd:         3.70 cm   Diastology LVIDs:         3.10 cm   LV e' medial:    5.44 cm/s LV PW:         1.10 cm   LV E/e' medial:  12.4 LV IVS:        1.30 cm   LV e' lateral:   6.20 cm/s LVOT  diam:     1.90 cm   LV E/e' lateral: 10.9 LV SV:         47 LV SV Index:   26 LVOT Area:     2.84 cm   LEFT ATRIUM           Index LA diam:      3.00 cm 1.67 cm/m LA Vol (A4C): 60.6 ml 33.66 ml/m AORTIC VALVE LVOT Vmax:   77.00 cm/s LVOT Vmean:  54.000 cm/s LVOT VTI:    0.165 m  AORTA Ao Root diam: 3.00 cm Ao Asc diam:  2.70 cm Ao Desc diam: 1.90 cm  MITRAL VALVE MV Area (PHT): 3.08 cm    SHUNTS MV Decel Time: 246 msec    Systemic VTI:  0.16 m MV E velocity: 67.70 cm/s  Systemic Diam: 1.90 cm MV A velocity: 81.00 cm/s MV E/A ratio:  0.84  Gypsy Balsam MD Electronically signed by Gypsy Balsam MD Signature Date/Time: 04/08/2022/11:40:46 AM    Final   TEE  ECHO TEE 10/01/2016  Narrative *[place]* *[facility]* [address] [place], [place] [address] [phone]  ------------------------------------------------------------------- Transesophageal Echocardiography  Patient:    C[place], Wonser MR #:       [id number] Study Date: 10/01/2016 Gender:     F Age:        50 Height:     147.3 cm Weight:     81.4 kg BSA:        1.87 m^2 Pt. Status: Room:       5C18C  PERFORMING   Kristeen Miss, M.D. ATTENDING  Hongalgi, Johnnette Gourd ORDERING     Arty Baumgartner REFERRING    Arty Baumgartner SONOGRAPHER  Dance, Tiffany  cc:  ------------------------------------------------------------------- LV EF: 60% -   65%  ------------------------------------------------------------------- Indications:      CVA 436.  ------------------------------------------------------------------- Study Conclusions  - Left ventricle: The cavity size was normal. Wall thickness was normal. Systolic function was normal. The estimated ejection fraction was in the range of 60% to 65%. - Aortic valve: No evidence of vegetation. - Left atrium: No evidence of thrombus in the atrial cavity or appendage. - Atrial septum:  No defect or patent foramen ovale was identified.  ------------------------------------------------------------------- Study data:   Study status:  Routine.  Consent:  The risks, benefits, and alternatives to the procedure and sedation were explained to the patient and informed consent was obtained. Procedure:  The patient reported no pain pre or post test. Initial setup. The patient was brought to the laboratory. Surface ECG leads were monitored. Sedation. Conscious sedation was administered. Transesophageal echocardiography. An adult multiplane transesophageal probe was inserted by the attending cardiologistwithout difficulty. Image quality was adequate.  Study completion:  The patient tolerated the procedure well. There were no complications.  Administered medications:   Fentanyl, , IV. Midazolam, 7mg , IV.          Diagnostic transesophageal echocardiography.  2D and color Doppler.  Birthdate:  Patient birthdate: [birth year].  Age:  Patient is 57 yr old.  Sex:  Gender: female.    BMI: 37.5 kg/m^2.  Blood pressure:     156/70  Patient status:  Inpatient.  Study date:  Study date: 10/01/2016. Study time: 01:57 PM.  Location:  Endoscopy.  -------------------------------------------------------------------  ------------------------------------------------------------------- Left ventricle:  The cavity size was normal. Wall thickness was normal. Systolic function was normal. The estimated ejection fraction was in the range of 60% to 65%.  ------------------------------------------------------------------- Aortic valve:   Structurally normal valve.   Cusp separation was normal.  No evidence of vegetation.  Doppler:  There was no regurgitation.  ------------------------------------------------------------------- Aorta:  The aorta was mildly calcified.  ------------------------------------------------------------------- Left atrium:   No evidence of thrombus in the atrial cavity  or appendage.  ------------------------------------------------------------------- Atrial septum:  No defect or patent foramen ovale was identified.  ------------------------------------------------------------------- Post procedure conclusions Ascending Aorta:  - The aorta was mildly calcified.  ------------------------------------------------------------------- Prepared and Electronically Authenticated by  Kristeen Miss, M.D. 2018-06-04T17:52:48   MONITORS  LONG TERM MONITOR (3-14 DAYS) 04/16/2022  Narrative Patch Wear Time:  8 days and 10 hours (2023-11-22T14:59:39-0500 to 2023-12-04T17:29:10-498)  Monitor 1 Patient had a min HR of 51 bpm, max HR of 100 bpm, and avg HR of 67 bpm. Predominant underlying rhythm was Sinus Rhythm. Isolated SVEs were rare (<1.0%), and no SVE Couplets or SVE Triplets were present. Isolated VEs were rare (<1.0%), and no VE Couplets or VE Triplets were present.  Monitor 2 Patient had a min HR of 52 bpm, max HR of 158 bpm, and avg HR of 66 bpm. Predominant underlying rhythm was Sinus Rhythm. 2 Supraventricular Tachycardia runs occurred, both paroxysmal atrial tachycardia, the run with the fastest interval lasting 11 beats with a max rate of 158 bpm (avg 137 bpm); the run with the fastest interval was also the longest. Junctional Rhythm was present. Junctional Rhythm was detected within +/- 45 seconds of symptomatic patient event(s). Isolated SVEs were rare (<1.0%), SVE Couplets were rare (<1.0%), and no SVE Triplets were present. Isolated VEs were rare (<1.0%), and no  VE Couplets or VE Triplets were present.  There was a total of 73 symptomatic events some say junctional however I can see P waves and all are sinus rhythm and unassociated with arrhythmia. Both monitor showed no significant sustained arrhythmia No atrial fibrillation or flutter no episodes of sinus pause second or third-degree AV nodal block. The strips noted to be junctional on my  review show P waves.            EKG:  EKG is  ordered today.  The ekg ordered today demonstrates sinus rhythm, heart rate 78 bpm, consistent with prior EKG tracings.  Recent Labs: 03/21/2022: NT-Pro BNP <36  Recent Lipid Panel    Component Value Date/Time   CHOL 124 08/09/2017 0622   TRIG 203 (H) 08/09/2017 0622   HDL 31 (L) 08/09/2017 0622   CHOLHDL 4.0 08/09/2017 0622   VLDL 41 (H) 08/09/2017 0622   LDLCALC 52 08/09/2017 0622     Risk Assessment/Calculations:                Physical Exam:    VS:  BP 138/84   Pulse 78   Ht 4\' 10"  (1.473 m)   Wt 197 lb 9.6 oz (89.6 kg)   SpO2 95%   BMI 41.30 kg/m     Wt Readings from Last 3 Encounters:  10/08/22 197 lb 9.6 oz (89.6 kg)  03/21/22 189 lb (85.7 kg)  03/06/22 186 lb (84.4 kg)     GEN:  Well nourished, well developed in no acute distress HEENT: Normal NECK: No JVD; No carotid bruits LYMPHATICS: No lymphadenopathy CARDIAC: RRR, no murmurs, rubs, gallops RESPIRATORY:  Clear to auscultation without rales, wheezing or rhonchi  ABDOMEN: Soft, non-tender, non-distended MUSCULOSKELETAL:  No edema; No deformity  SKIN: Warm and dry NEUROLOGIC:  Alert and oriented x 3 PSYCHIATRIC:  Normal affect   ASSESSMENT:    1. Bilateral lower extremity edema   2. Essential hypertension   3. Palpitations   4. Cerebrovascular accident (CVA) due to bilateral stenosis of middle cerebral arteries (HCC)     PLAN:    In order of problems listed above:  Bilateral lower extremity edema-no edema appreciated today, echo in 2023 revealed an EF 60 to 65%, grade 1 DD.  Continue Lasix 40 mg daily. Hypertension-blood pressure is marginally controlled at 138/84, continue lisinopril 40 mg daily. OSA on CPAP-currently not wearing, there is some titration occurring however she voices compliance.  Managed by pulmonology. Class III obesity-we discussed the possibility of GLP-1 agonist as well as offered referral to healthy weight and wellness,  she politely declined both.  We discussed the importance of making small changes where possible both to her diet and with her physical activity. CVA -multiple strokes over the years, s/p ILR implantation and removal, continue Plavix 75 mg daily, managed by PCP. Palpitations-quiescent.  Disposition-return in 6 months.           Medication Adjustments/Labs and Tests Ordered: Current medicines are reviewed at length with the patient today.  Concerns regarding medicines are outlined above.  Orders Placed This Encounter  Procedures   EKG 12-Lead   No orders of the defined types were placed in this encounter.   Patient Instructions  Medication Instructions:  Your physician recommends that you continue on your current medications as directed. Please refer to the Current Medication list given to you today.  *If you need a refill on your cardiac medications before your next appointment, please call your pharmacy*   Lab  Work: None ordered If you have labs (blood work) drawn today and your tests are completely normal, you will receive your results only by: MyChart Message (if you have MyChart) OR A paper copy in the mail If you have any lab test that is abnormal or we need to change your treatment, we will call you to review the results.   Testing/Procedures: None ordered   Follow-Up: At [facility], you and your health needs are our priority.  As part of our continuing mission to provide you with exceptional heart care, we have created designated Provider Care Teams.  These Care Teams include your primary Cardiologist (physician) and Advanced Practice Providers (APPs -  Physician Assistants and Nurse Practitioners) who all work together to provide you with the care you need, when you need it.  We recommend signing up for the patient portal called "MyChart".  Sign up information is provided on this After Visit Summary.  MyChart is used to connect with patients for Virtual  Visits (Telemedicine).  Patients are able to view lab/test results, encounter notes, upcoming appointments, etc.  Non-urgent messages can be sent to your provider as well.   To learn more about what you can do with MyChart, go to [email or web address].    Your next appointment:   6 month(s)  The format for your next appointment:   In Person  Provider:   Norman Herrlich, MD or Wallis Bamberg, NP [facility])    Other Instructions none  Important Information About Sugar        Signed, Flossie Dibble, NP  10/08/2022 3:44 PM    [facility]

## 2022-11-08 DIAGNOSIS — I10 Essential (primary) hypertension: Secondary | ICD-10-CM | POA: Diagnosis not present

## 2022-11-08 DIAGNOSIS — E785 Hyperlipidemia, unspecified: Secondary | ICD-10-CM | POA: Diagnosis not present

## 2022-11-08 DIAGNOSIS — I6789 Other cerebrovascular disease: Secondary | ICD-10-CM | POA: Diagnosis not present

## 2022-11-08 DIAGNOSIS — R7301 Impaired fasting glucose: Secondary | ICD-10-CM | POA: Diagnosis not present

## 2022-11-12 DIAGNOSIS — R52 Pain, unspecified: Secondary | ICD-10-CM | POA: Diagnosis not present

## 2022-11-12 DIAGNOSIS — M545 Low back pain, unspecified: Secondary | ICD-10-CM | POA: Diagnosis not present

## 2022-11-12 DIAGNOSIS — M791 Myalgia, unspecified site: Secondary | ICD-10-CM | POA: Diagnosis not present

## 2022-11-12 DIAGNOSIS — M79604 Pain in right leg: Secondary | ICD-10-CM | POA: Diagnosis not present

## 2022-12-18 DIAGNOSIS — M5459 Other low back pain: Secondary | ICD-10-CM | POA: Diagnosis not present

## 2022-12-20 DIAGNOSIS — M5459 Other low back pain: Secondary | ICD-10-CM | POA: Diagnosis not present

## 2022-12-24 DIAGNOSIS — J069 Acute upper respiratory infection, unspecified: Secondary | ICD-10-CM | POA: Diagnosis not present

## 2022-12-24 DIAGNOSIS — R509 Fever, unspecified: Secondary | ICD-10-CM | POA: Diagnosis not present

## 2022-12-25 DIAGNOSIS — M5459 Other low back pain: Secondary | ICD-10-CM | POA: Diagnosis not present

## 2022-12-28 DIAGNOSIS — Z79891 Long term (current) use of opiate analgesic: Secondary | ICD-10-CM | POA: Diagnosis not present

## 2023-01-11 DIAGNOSIS — B9689 Other specified bacterial agents as the cause of diseases classified elsewhere: Secondary | ICD-10-CM | POA: Diagnosis not present

## 2023-01-11 DIAGNOSIS — J208 Acute bronchitis due to other specified organisms: Secondary | ICD-10-CM | POA: Diagnosis not present

## 2023-01-14 DIAGNOSIS — R52 Pain, unspecified: Secondary | ICD-10-CM | POA: Diagnosis not present

## 2023-01-14 DIAGNOSIS — F1120 Opioid dependence, uncomplicated: Secondary | ICD-10-CM | POA: Diagnosis not present

## 2023-01-14 DIAGNOSIS — Z79891 Long term (current) use of opiate analgesic: Secondary | ICD-10-CM | POA: Diagnosis not present

## 2023-01-14 DIAGNOSIS — M545 Low back pain, unspecified: Secondary | ICD-10-CM | POA: Diagnosis not present

## 2023-02-08 DIAGNOSIS — L409 Psoriasis, unspecified: Secondary | ICD-10-CM | POA: Diagnosis not present

## 2023-02-08 DIAGNOSIS — R7301 Impaired fasting glucose: Secondary | ICD-10-CM | POA: Diagnosis not present

## 2023-02-08 DIAGNOSIS — E785 Hyperlipidemia, unspecified: Secondary | ICD-10-CM | POA: Diagnosis not present

## 2023-02-08 DIAGNOSIS — I6789 Other cerebrovascular disease: Secondary | ICD-10-CM | POA: Diagnosis not present

## 2023-02-08 DIAGNOSIS — R600 Localized edema: Secondary | ICD-10-CM | POA: Diagnosis not present

## 2023-02-08 DIAGNOSIS — I10 Essential (primary) hypertension: Secondary | ICD-10-CM | POA: Diagnosis not present

## 2023-03-11 DIAGNOSIS — M545 Low back pain, unspecified: Secondary | ICD-10-CM | POA: Diagnosis not present

## 2023-03-11 DIAGNOSIS — R52 Pain, unspecified: Secondary | ICD-10-CM | POA: Diagnosis not present

## 2023-03-20 DIAGNOSIS — F1120 Opioid dependence, uncomplicated: Secondary | ICD-10-CM | POA: Diagnosis not present

## 2023-03-20 DIAGNOSIS — Z79891 Long term (current) use of opiate analgesic: Secondary | ICD-10-CM | POA: Diagnosis not present

## 2023-03-26 DIAGNOSIS — M9905 Segmental and somatic dysfunction of pelvic region: Secondary | ICD-10-CM | POA: Diagnosis not present

## 2023-03-26 DIAGNOSIS — M9903 Segmental and somatic dysfunction of lumbar region: Secondary | ICD-10-CM | POA: Diagnosis not present

## 2023-03-26 DIAGNOSIS — M9902 Segmental and somatic dysfunction of thoracic region: Secondary | ICD-10-CM | POA: Diagnosis not present

## 2023-03-26 DIAGNOSIS — M51362 Other intervertebral disc degeneration, lumbar region with discogenic back pain and lower extremity pain: Secondary | ICD-10-CM | POA: Diagnosis not present

## 2023-04-01 DIAGNOSIS — H52223 Regular astigmatism, bilateral: Secondary | ICD-10-CM | POA: Diagnosis not present

## 2023-04-01 DIAGNOSIS — H3561 Retinal hemorrhage, right eye: Secondary | ICD-10-CM | POA: Diagnosis not present

## 2023-04-01 DIAGNOSIS — H5213 Myopia, bilateral: Secondary | ICD-10-CM | POA: Diagnosis not present

## 2023-04-19 DIAGNOSIS — H5213 Myopia, bilateral: Secondary | ICD-10-CM | POA: Diagnosis not present

## 2023-05-01 DIAGNOSIS — I2510 Atherosclerotic heart disease of native coronary artery without angina pectoris: Secondary | ICD-10-CM

## 2023-05-01 HISTORY — DX: Atherosclerotic heart disease of native coronary artery without angina pectoris: I25.10

## 2023-05-03 DIAGNOSIS — M79604 Pain in right leg: Secondary | ICD-10-CM | POA: Diagnosis not present

## 2023-05-03 DIAGNOSIS — Z79891 Long term (current) use of opiate analgesic: Secondary | ICD-10-CM | POA: Diagnosis not present

## 2023-05-03 DIAGNOSIS — M545 Low back pain, unspecified: Secondary | ICD-10-CM | POA: Diagnosis not present

## 2023-05-03 DIAGNOSIS — F1120 Opioid dependence, uncomplicated: Secondary | ICD-10-CM | POA: Diagnosis not present

## 2023-05-03 DIAGNOSIS — M791 Myalgia, unspecified site: Secondary | ICD-10-CM | POA: Diagnosis not present

## 2023-05-13 DIAGNOSIS — M9903 Segmental and somatic dysfunction of lumbar region: Secondary | ICD-10-CM | POA: Diagnosis not present

## 2023-05-13 DIAGNOSIS — I10 Essential (primary) hypertension: Secondary | ICD-10-CM | POA: Diagnosis not present

## 2023-05-13 DIAGNOSIS — M9905 Segmental and somatic dysfunction of pelvic region: Secondary | ICD-10-CM | POA: Diagnosis not present

## 2023-05-13 DIAGNOSIS — M51362 Other intervertebral disc degeneration, lumbar region with discogenic back pain and lower extremity pain: Secondary | ICD-10-CM | POA: Diagnosis not present

## 2023-05-13 DIAGNOSIS — R7301 Impaired fasting glucose: Secondary | ICD-10-CM | POA: Diagnosis not present

## 2023-05-13 DIAGNOSIS — E612 Magnesium deficiency: Secondary | ICD-10-CM | POA: Diagnosis not present

## 2023-05-13 DIAGNOSIS — E785 Hyperlipidemia, unspecified: Secondary | ICD-10-CM | POA: Diagnosis not present

## 2023-05-13 DIAGNOSIS — I6789 Other cerebrovascular disease: Secondary | ICD-10-CM | POA: Diagnosis not present

## 2023-05-13 DIAGNOSIS — M9902 Segmental and somatic dysfunction of thoracic region: Secondary | ICD-10-CM | POA: Diagnosis not present

## 2023-05-20 DIAGNOSIS — M9903 Segmental and somatic dysfunction of lumbar region: Secondary | ICD-10-CM | POA: Diagnosis not present

## 2023-05-20 DIAGNOSIS — M9902 Segmental and somatic dysfunction of thoracic region: Secondary | ICD-10-CM | POA: Diagnosis not present

## 2023-05-20 DIAGNOSIS — M51362 Other intervertebral disc degeneration, lumbar region with discogenic back pain and lower extremity pain: Secondary | ICD-10-CM | POA: Diagnosis not present

## 2023-05-20 DIAGNOSIS — M9905 Segmental and somatic dysfunction of pelvic region: Secondary | ICD-10-CM | POA: Diagnosis not present

## 2023-05-27 DIAGNOSIS — M9903 Segmental and somatic dysfunction of lumbar region: Secondary | ICD-10-CM | POA: Diagnosis not present

## 2023-05-27 DIAGNOSIS — M9902 Segmental and somatic dysfunction of thoracic region: Secondary | ICD-10-CM | POA: Diagnosis not present

## 2023-05-27 DIAGNOSIS — M9905 Segmental and somatic dysfunction of pelvic region: Secondary | ICD-10-CM | POA: Diagnosis not present

## 2023-05-27 DIAGNOSIS — M51362 Other intervertebral disc degeneration, lumbar region with discogenic back pain and lower extremity pain: Secondary | ICD-10-CM | POA: Diagnosis not present

## 2023-06-03 DIAGNOSIS — M51362 Other intervertebral disc degeneration, lumbar region with discogenic back pain and lower extremity pain: Secondary | ICD-10-CM | POA: Diagnosis not present

## 2023-06-03 DIAGNOSIS — M9903 Segmental and somatic dysfunction of lumbar region: Secondary | ICD-10-CM | POA: Diagnosis not present

## 2023-06-03 DIAGNOSIS — M9902 Segmental and somatic dysfunction of thoracic region: Secondary | ICD-10-CM | POA: Diagnosis not present

## 2023-06-03 DIAGNOSIS — M9905 Segmental and somatic dysfunction of pelvic region: Secondary | ICD-10-CM | POA: Diagnosis not present

## 2023-06-10 DIAGNOSIS — M9905 Segmental and somatic dysfunction of pelvic region: Secondary | ICD-10-CM | POA: Diagnosis not present

## 2023-06-10 DIAGNOSIS — M51362 Other intervertebral disc degeneration, lumbar region with discogenic back pain and lower extremity pain: Secondary | ICD-10-CM | POA: Diagnosis not present

## 2023-06-10 DIAGNOSIS — M9902 Segmental and somatic dysfunction of thoracic region: Secondary | ICD-10-CM | POA: Diagnosis not present

## 2023-06-10 DIAGNOSIS — M9903 Segmental and somatic dysfunction of lumbar region: Secondary | ICD-10-CM | POA: Diagnosis not present

## 2023-06-17 DIAGNOSIS — M9905 Segmental and somatic dysfunction of pelvic region: Secondary | ICD-10-CM | POA: Diagnosis not present

## 2023-06-17 DIAGNOSIS — M51362 Other intervertebral disc degeneration, lumbar region with discogenic back pain and lower extremity pain: Secondary | ICD-10-CM | POA: Diagnosis not present

## 2023-06-17 DIAGNOSIS — M9902 Segmental and somatic dysfunction of thoracic region: Secondary | ICD-10-CM | POA: Diagnosis not present

## 2023-06-17 DIAGNOSIS — M9903 Segmental and somatic dysfunction of lumbar region: Secondary | ICD-10-CM | POA: Diagnosis not present

## 2023-06-26 DIAGNOSIS — M9903 Segmental and somatic dysfunction of lumbar region: Secondary | ICD-10-CM | POA: Diagnosis not present

## 2023-06-26 DIAGNOSIS — M9905 Segmental and somatic dysfunction of pelvic region: Secondary | ICD-10-CM | POA: Diagnosis not present

## 2023-06-26 DIAGNOSIS — M51362 Other intervertebral disc degeneration, lumbar region with discogenic back pain and lower extremity pain: Secondary | ICD-10-CM | POA: Diagnosis not present

## 2023-06-26 DIAGNOSIS — M9902 Segmental and somatic dysfunction of thoracic region: Secondary | ICD-10-CM | POA: Diagnosis not present

## 2023-06-27 ENCOUNTER — Encounter: Payer: Self-pay | Admitting: Cardiology

## 2023-06-27 ENCOUNTER — Ambulatory Visit: Payer: Medicare HMO | Attending: Cardiology | Admitting: Cardiology

## 2023-06-27 VITALS — BP 130/80 | HR 71 | Ht <= 58 in | Wt 195.0 lb

## 2023-06-27 DIAGNOSIS — E66813 Obesity, class 3: Secondary | ICD-10-CM

## 2023-06-27 DIAGNOSIS — R002 Palpitations: Secondary | ICD-10-CM

## 2023-06-27 DIAGNOSIS — I10 Essential (primary) hypertension: Secondary | ICD-10-CM

## 2023-06-27 DIAGNOSIS — G4733 Obstructive sleep apnea (adult) (pediatric): Secondary | ICD-10-CM

## 2023-06-27 DIAGNOSIS — I63513 Cerebral infarction due to unspecified occlusion or stenosis of bilateral middle cerebral arteries: Secondary | ICD-10-CM | POA: Diagnosis not present

## 2023-06-27 DIAGNOSIS — Z6841 Body Mass Index (BMI) 40.0 and over, adult: Secondary | ICD-10-CM

## 2023-06-27 DIAGNOSIS — R0609 Other forms of dyspnea: Secondary | ICD-10-CM | POA: Diagnosis not present

## 2023-06-27 DIAGNOSIS — J4489 Other specified chronic obstructive pulmonary disease: Secondary | ICD-10-CM

## 2023-06-27 MED ORDER — METOPROLOL TARTRATE 100 MG PO TABS: ORAL_TABLET | ORAL | 0 refills | Status: DC

## 2023-06-27 NOTE — Patient Instructions (Addendum)
 Medication Instructions:   TAKE: Metoprolol 100mg  1 tablet 2 hours prior to CT Scan   Lab Work: None Ordered If you have labs (blood work) drawn today and your tests are completely normal, you will receive your results only by: MyChart Message (if you have MyChart) OR A paper copy in the mail If you have any lab test that is abnormal or we need to change your treatment, we will call you to review the results.   Testing/Procedures:  Your cardiac CT will be scheduled at one of the below locations:   [facility] [address] [place], [place] [address] [phone]   If scheduled at [facility], please arrive at the [facility] (Entrance C2) of [facility] 30 minutes prior to test start time. You can use the FREE valet parking offered at entrance C (encouraged to control the heart rate for the test)  Proceed to the [facility] Radiology Department (first floor) to check-in and test prep.  All radiology patients and guests should use entrance C2 at [facility], accessed from [facility], even though the hospital's physical address listed is [address].      Please follow these instructions carefully (unless otherwise directed):    On the Night Before the Test: Be sure to Drink plenty of water. Do not consume any caffeinated/decaffeinated beverages or chocolate 12 hours prior to your test. Do not take any antihistamines 12 hours prior to your test.   On the Day of the Test: Drink plenty of water until 1 hour prior to the test. Do not eat any food 4 hours prior to the test. You may take your regular medications prior to the test.  Take metoprolol (Lopressor) two hours prior to test. HOLD Furosemide/Hydrochlorothiazide morning of the test. FEMALES- please wear underwire-free bra if available, avoid dresses & tight clothing        After the Test: Drink plenty of water. After receiving IV  contrast, you may experience a mild flushed feeling. This is normal. On occasion, you may experience a mild rash up to 24 hours after the test. This is not dangerous. If this occurs, you can take Benadryl 25 mg and increase your fluid intake. If you experience trouble breathing, this can be serious. If it is severe call 911 IMMEDIATELY. If it is mild, please call our office. If you take any of these medications: Glipizide/Metformin, Avandament, Glucavance, please do not take 48 hours after completing test unless otherwise instructed.  We will call to schedule your test 2-4 weeks out understanding that some insurance companies will need an authorization prior to the service being performed.   For non-scheduling related questions, please contact the cardiac imaging nurse navigator should you have any questions/concerns: Rockwell Alexandria, Cardiac Imaging Nurse Navigator Larey Brick, Cardiac Imaging Nurse Navigator [facility] Direct Office Dial: [phone]   For scheduling needs, including cancellations and rescheduling, please call [place], [phone].    Follow-Up: At [facility], you and your health needs are our priority.  As part of our continuing mission to provide you with exceptional heart care, we have created designated Provider Care Teams.  These Care Teams include your primary Cardiologist (physician) and Advanced Practice Providers (APPs -  Physician Assistants and Nurse Practitioners) who all work together to provide you with the care you need, when you need it.  We recommend signing up for the patient portal called "MyChart".  Sign up information  is provided on this After Visit Summary.  MyChart is used to connect with patients for Virtual Visits (Telemedicine).  Patients are able to view lab/test results, encounter notes, upcoming appointments, etc.  Non-urgent messages can be sent to your provider as well.   To learn more about what you can do with  MyChart, go to [email or web address].    Your next appointment:   6 month(s)  The format for your next appointment:   In Person  Provider:   Wallis Bamberg, NP   Other Instructions NA

## 2023-06-27 NOTE — Progress Notes (Signed)
 Cardiology Office Note:    Date:  06/27/2023   ID:  Lynn Rollins, DOB Nov 08, 1965, MRN [id number]  PCP:  Paulina Fusi, MD   [facility] Providers Cardiologist:  Norman Herrlich, MD     Referring MD: Paulina Fusi, MD   CC: follow up shortness of breath  History of Present Illness:    Lynn Rollins is a 58 y.o. female with a hx of hypertension, cryptogenic CVA, migraine, OSA on CPAP, GERD, IBS, chronic pain, fibromyalgia, hyperlipidemia, depression, anxiety, mild bilateral carotid artery stenosis.  04/16/2022 monitor average heart rate 67 bpm, predominant rhythm was sinus, 2 episodes of SVT 04/06/2022 Echo EF 60 to 65%, grade 1 DD, no valvular abnormalities.   Previously had an ILR that was removed in 2022 secondary to cryptogenic stroke. 10/01/2016 transesophageal echo EF 60-65%, aortic was mildly calcified 09/29/2016 carotid duplex mild bilateral carotid artery stenosis  Evaluated by Dr. Dulce Sellar on 03/21/2022 at this time her blood pressure was very elevated at 192/98, she was experiencing bilateral lower extremity edema.  ProBNP was 36.  An echocardiogram was arranged and completed on 04/06/2022 which revealed an EF of 60 to 65%, grade 1 DD, no valvular abnormalities.  A monitor was arranged which showed predominant underlying rhythm was sinus.   Evaluated 10/08/2022 for follow-up of her hypertension, most bothered by weight gain, also working with pulmonology for management of her CPAP.  Offered to refer her to healthy weight and wellness but she ultimately declined.  She presents today for follow-up of shortness of breath.  She has also been dealing with a lot of anxiety, apparently her anxiolytic was stopped her pain management specialist so there could be an increase in her opioid.  Regarding her shortness of breath, this occurs with exertion and at rest, she is very fearful that it could be related to her heart.  She is following closely with pulmonology, she says  has asthma or COPD-as she has received conflicting information, she is not able to tolerate her CPAP machine.She denies chest pain, palpitations, pnd, orthopnea, n, v, dizziness, syncope, edema, weight gain, or early satiety.    Past Medical History:  Diagnosis Date   Acute bronchitis 09/27/2016   Acute ischemic left ACA stroke (HCC)    Anxiety    Brain lesion 2011-2012   MRI   Cerebral embolism with cerebral infarction 09/27/2016   Chronic fatigue    Chronic pain 05/29/2017   Conversion disorder    COPD with asthma (HCC) 08/09/2017   CVA (cerebral vascular accident) (HCC) 08/08/2017   Degeneration of C5-C6 intervertebral disc    Depression    Essential hypertension 09/27/2016   Eustachian tube dysfunction    Fibromyalgia    Generalized osteoarthritis of multiple sites    GERD (gastroesophageal reflux disease)    H. pylori infection    Hearing loss of left ear 09/09/2017   Hemorrhoid    Hiatal hernia with GERD    History of loop recorder 08/25/2020   History of stroke    Hyperlipemia    Hypoglycemia    IBS (irritable bowel syndrome)    Migraine    Morbid obesity (HCC)    OSA on CPAP    Scalp psoriasis    Sensorineural hearing loss (SNHL) of left ear with unrestricted hearing of right ear 09/26/2017   Formatting of this note might be different from the original. Onset 07/2017 with CVA.  MRI brain just showed old infarcts. Last Assessment & Plan: Formatting of this  note might be different from the original. Follow-up sudden left sensorineural hearing loss. Onset back in April.  She was treated at [facility] for stroke.  She was given steroids.  Unfortunately hearing is still very bad.  Some unste   Stroke [facility])    Vitamin D deficiency     Past Surgical History:  Procedure Laterality Date   ABDOMINAL HYSTERECTOMY     CHOLECYSTECTOMY     COLONOSCOPY  04/28/2015   Mild sigmoid diverticulosis. Otherwise normal colonoscopy   ESOPHAGOGASTRODUODENOSCOPY  09/11/2013   Mild  gastritis. Otherwise normal esophagogastroduodenoscopy   LOOP RECORDER INSERTION N/A 10/01/2016   Procedure: Loop Recorder Insertion;  Surgeon: Duke Salvia, MD;  Location: [facility] INVASIVE CV LAB;  Service: Cardiovascular;  Laterality: N/A;   LOOP RECORDER REMOVAL N/A 09/07/2020   Procedure: LOOP RECORDER REMOVAL;  Surgeon: Duke Salvia, MD;  Location: [facility] INVASIVE CV LAB;  Service: Cardiovascular;  Laterality: N/A;   OTHER SURGICAL HISTORY     screw in head. Bone anch [place] hearing aid   TEE WITHOUT CARDIOVERSION N/A 10/01/2016   Procedure: TRANSESOPHAGEAL ECHOCARDIOGRAM (TEE);  Surgeon: Vesta Mixer, MD;  Location: [facility] ENDOSCOPY;  Service: Cardiovascular;  Laterality: N/A;   TUBAL LIGATION      Current Medications: Current Meds  Medication Sig   albuterol (PROVENTIL HFA;VENTOLIN HFA) 108 (90 BASE) MCG/ACT inhaler Inhale 2 puffs into the lungs every 4 (four) hours as needed for wheezing.    calcipotriene (DOVONOX) 0.005 % cream Apply 1 Application topically 2 (two) times daily.   clobetasol (TEMOVATE) 0.05 % external solution Apply 1 Application topically 2 (two) times daily.   clopidogrel (PLAVIX) 75 MG tablet Take 75 mg by mouth daily.   DEXILANT 60 MG capsule Take 60 mg by mouth daily.   dicyclomine (BENTYL) 20 MG tablet Take 20 mg by mouth 4 (four) times daily.   famotidine (PEPCID) 40 MG tablet Take 1 tablet by mouth daily.   Fluticasone-Umeclidin-Vilant (TRELEGY ELLIPTA) 200-62.5-25 MCG/ACT AEPB Inhale 1 puff into the lungs daily.   furosemide (LASIX) 40 MG tablet Take 40 mg by mouth daily.   gabapentin (NEURONTIN) 600 MG tablet Take 600 mg by mouth 3 (three) times daily.   ipratropium-albuterol (DUONEB) 0.5-2.5 (3) MG/3ML SOLN Inhale 3 mLs into the lungs every 6 (six) hours as needed (for shortness of breath or wheezing).    lisinopril (PRINIVIL,ZESTRIL) 40 MG tablet Take 40 mg by mouth daily.   meclizine (ANTIVERT) 25 MG tablet Take 25 mg by mouth 3 (three) times daily as needed for  dizziness.   metoprolol tartrate (LOPRESSOR) 100 MG tablet Take one tablet 2 hours before cardiac CT for heart greater than 55   montelukast (SINGULAIR) 10 MG tablet Take 10 mg by mouth daily.   Omega-3 Fatty Acids (FISH OIL) 1000 MG CAPS Take 1,000 mg by mouth 2 (two) times daily.   OXYCONTIN 10 MG 12 hr tablet Take 10 mg by mouth QID.    temazepam (RESTORIL) 30 MG capsule Take 30 mg by mouth at bedtime as needed for sleep.   triamcinolone cream (KENALOG) 0.1 % Apply 1 Application topically 2 (two) times daily.   Vitamin D, Ergocalciferol, (DRISDOL) 50000 units CAPS capsule Take 50,000 Units by mouth every Saturday.      Allergies:   Celexa [citalopram hydrobromide], Tape, Broccoli [brassica oleracea], and Statins   Social History   Socioeconomic History   Marital status: Married    Spouse name: Not on file   Number of children:  Not on file   Years of education: Not on file   Highest education level: Not on file  Occupational History   Not on file  Tobacco Use   Smoking status: Never   Smokeless tobacco: Never  Vaping Use   Vaping status: Never Used  Substance and Sexual Activity   Alcohol use: No   Drug use: No   Sexual activity: Not on file  Other Topics Concern   Not on file  Social History Narrative   Lives at home with Hessie Diener, husband.  She is disabled.  Education graduate.  2 children.  Caffeine 2 drinks per day.  (most).   Social Drivers of Corporate investment banker Strain: Not on file  Food Insecurity: Not on file  Transportation Needs: Not on file  Physical Activity: Not on file  Stress: Not on file  Social Connections: Not on file     Family History: The patient's family history includes Alcohol abuse in her father; Asthma in her paternal grandmother; Diabetes in her maternal grandmother and paternal grandmother; Heart attack in her father, maternal grandmother, mother, and paternal grandmother; Heart disease in her father, maternal grandfather, maternal  grandmother, paternal grandfather, and paternal grandmother; Hypertension in her father, maternal grandfather, maternal grandmother, mother, paternal grandfather, and paternal grandmother; Multiple sclerosis in her brother; Other in her father; Parkinson's disease in her father; Stroke in her father, maternal grandfather, maternal grandmother, mother, paternal grandfather, and paternal grandmother; Thyroid disease in her sister. There is no history of Esophageal cancer.  ROS:   Please see the history of present illness.    All other systems reviewed and are negative.  EKGs/Labs/Other Studies Reviewed:    The following studies were reviewed today: Cardiac Studies & Procedures   ______________________________________________________________________________________________     ECHOCARDIOGRAM  ECHOCARDIOGRAM COMPLETE 04/06/2022  Narrative ECHOCARDIOGRAM REPORT    Patient Name:   TAKESHIA WENK Date of Exam: 04/06/2022 Medical Rec #:  [id number]        Height:       59.0 in Accession #:    [id number]       Weight:       189.0 lb Date of Birth:  05-Jul-1965        BSA:          1.801 m Patient Age:    56 years         BP:           192/98 mmHg Patient Gender: F                HR:           69 bpm. Exam Location:  [place]  Procedure: 2D Echo, Cardiac Doppler and Color Doppler  Indications:    Bilateral lower extremity edema [R60.0]. Essential hypertension [I10]. Palpitations. COPD with asthma [J44.89] Other chronic pain [G89.29]  History:        Patient has prior history of Echocardiogram examinations, most recent 08/09/2017. Signs/Symptoms:Dyspnea.  Sonographer:    Margreta Journey RDCS Referring Phys: [id number] BRIAN J [facility]   Sonographer Comments: Technically difficult study due to poor echo windows. Image acquisition challenging due to patient body habitus. Global longitudinal strain was attempted. Restricted mobility. Breast intertrigo. IMPRESSIONS   1. TDS and limited study.  Mild ASH noted, no LVOT gradient. Left ventricular ejection fraction, by estimation, is 60 to 65%. The left ventricle has normal function. The left ventricle has no regional wall motion abnormalities. Left ventricular diastolic parameters are consistent with  Grade I diastolic dysfunction (impaired relaxation). 2. Right ventricular systolic function is normal. The right ventricular size is normal. 3. The mitral valve is normal in structure. No evidence of mitral valve regurgitation. No evidence of mitral stenosis. 4. The aortic valve is normal in structure. Aortic valve regurgitation is not visualized. No aortic stenosis is present. 5. The inferior vena cava is normal in size with greater than 50% respiratory variability, suggesting right atrial pressure of 3 mmHg.  FINDINGS Left Ventricle: TDS and limited study. Mild ASH noted, no LVOT gradient. Left ventricular ejection fraction, by estimation, is 60 to 65%. The left ventricle has normal function. The left ventricle has no regional wall motion abnormalities. The left ventricular internal cavity size was normal in size. There is no left ventricular hypertrophy. Left ventricular diastolic parameters are consistent with Grade I diastolic dysfunction (impaired relaxation).  Right Ventricle: The right ventricular size is normal. No increase in right ventricular wall thickness. Right ventricular systolic function is normal.  Left Atrium: Left atrial size was normal in size.  Right Atrium: Right atrial size was normal in size.  Pericardium: There is no evidence of pericardial effusion.  Mitral Valve: The mitral valve is normal in structure. No evidence of mitral valve regurgitation. No evidence of mitral valve stenosis.  Tricuspid Valve: The tricuspid valve is normal in structure. Tricuspid valve regurgitation is not demonstrated. No evidence of tricuspid stenosis.  Aortic Valve: The aortic valve is normal in structure. Aortic valve regurgitation is  not visualized. No aortic stenosis is present.  Pulmonic Valve: The pulmonic valve was normal in structure. Pulmonic valve regurgitation is not visualized. No evidence of pulmonic stenosis.  Aorta: The aortic root is normal in size and structure.  Venous: The inferior vena cava is normal in size with greater than 50% respiratory variability, suggesting right atrial pressure of 3 mmHg.  IAS/Shunts: No atrial level shunt detected by color flow Doppler.   LEFT VENTRICLE PLAX 2D LVIDd:         3.70 cm   Diastology LVIDs:         3.10 cm   LV e' medial:    5.44 cm/s LV PW:         1.10 cm   LV E/e' medial:  12.4 LV IVS:        1.30 cm   LV e' lateral:   6.20 cm/s LVOT diam:     1.90 cm   LV E/e' lateral: 10.9 LV SV:         47 LV SV Index:   26 LVOT Area:     2.84 cm   LEFT ATRIUM           Index LA diam:      3.00 cm 1.67 cm/m LA Vol (A4C): 60.6 ml 33.66 ml/m AORTIC VALVE LVOT Vmax:   77.00 cm/s LVOT Vmean:  54.000 cm/s LVOT VTI:    0.165 m  AORTA Ao Root diam: 3.00 cm Ao Asc diam:  2.70 cm Ao Desc diam: 1.90 cm  MITRAL VALVE MV Area (PHT): 3.08 cm    SHUNTS MV Decel Time: 246 msec    Systemic VTI:  0.16 m MV E velocity: 67.70 cm/s  Systemic Diam: 1.90 cm MV A velocity: 81.00 cm/s MV E/A ratio:  0.84  Gypsy Balsam MD Electronically signed by Gypsy Balsam MD Signature Date/Time: 04/08/2022/11:40:46 AM    Final   TEE  ECHO TEE 10/01/2016  Narrative *[place]* *[facility]* [address] [place], [place] [address] [phone]  ------------------------------------------------------------------- Transesophageal Echocardiography  Patient:    Rashada, Klontz MR #:       [id number] Study Date: 10/01/2016 Gender:     F Age:        50 Height:     147.3 cm Weight:     81.4 kg BSA:        1.87 m^2 Pt. Status: Room:       5C18C  PERFORMING   Kristeen Miss, M.D. ATTENDING    Hongalgi, Azucena Kuba REFERRING    Arty Baumgartner SONOGRAPHER  Dance, Tiffany  cc:  ------------------------------------------------------------------- LV EF: 60% -   65%  ------------------------------------------------------------------- Indications:      CVA 436.  ------------------------------------------------------------------- Study Conclusions  - Left ventricle: The cavity size was normal. Wall thickness was normal. Systolic function was normal. The estimated ejection fraction was in the range of 60% to 65%. - Aortic valve: No evidence of vegetation. - Left atrium: No evidence of thrombus in the atrial cavity or appendage. - Atrial septum: No defect or patent foramen ovale was identified.  ------------------------------------------------------------------- Study data:   Study status:  Routine.  Consent:  The risks, benefits, and alternatives to the procedure and sedation were explained to the patient and informed consent was obtained. Procedure:  The patient reported no pain pre or post test. Initial setup. The patient was brought to the laboratory. Surface ECG leads were monitored. Sedation. Conscious sedation was administered. Transesophageal echocardiography. An adult multiplane transesophageal probe was inserted by the attending cardiologistwithout difficulty. Image quality was adequate.  Study completion:  The patient tolerated the procedure well. There were no complications.  Administered medications:   Fentanyl, , IV. Midazolam, 7mg , IV.          Diagnostic transesophageal echocardiography.  2D and color Doppler.  Birthdate:  Patient birthdate: 1965/05/01.  Age:  Patient is 58 yr old.  Sex:  Gender: female.    BMI: 37.5 kg/m^2.  Blood pressure:     156/70  Patient status:  Inpatient.  Study date:  Study date: 10/01/2016. Study time: 01:57 PM.  Location:   Endoscopy.  -------------------------------------------------------------------  ------------------------------------------------------------------- Left ventricle:  The cavity size was normal. Wall thickness was normal. Systolic function was normal. The estimated ejection fraction was in the range of 60% to 65%.  ------------------------------------------------------------------- Aortic valve:   Structurally normal valve.   Cusp separation was normal.  No evidence of vegetation.  Doppler:  There was no regurgitation.  ------------------------------------------------------------------- Aorta:  The aorta was mildly calcified.  ------------------------------------------------------------------- Left atrium:   No evidence of thrombus in the atrial cavity or appendage.  ------------------------------------------------------------------- Atrial septum:  No defect or patent foramen ovale was identified.  ------------------------------------------------------------------- Post procedure conclusions Ascending Aorta:  - The aorta was mildly calcified.  ------------------------------------------------------------------- Prepared and Electronically Authenticated by  Kristeen Miss, M.D. 2018-06-04T17:52:48  MONITORS  LONG TERM MONITOR (3-14 DAYS) 04/13/2022  Narrative Patch Wear Time:  8 days and 10 hours (2023-11-22T14:59:39-0500 to 2023-12-04T17:29:10-498)  Monitor 1 Patient had a min HR of 51 bpm, max HR of 100 bpm, and avg HR of 67 bpm. Predominant underlying rhythm was Sinus Rhythm. Isolated SVEs were rare (<1.0%), and no SVE Couplets or SVE Triplets were present. Isolated VEs were rare (<1.0%), and no VE Couplets or VE Triplets were present.  Monitor 2 Patient had a min HR of 52 bpm, max HR of 158 bpm, and  avg HR of 66 bpm. Predominant underlying rhythm was Sinus Rhythm. 2 Supraventricular Tachycardia runs occurred, both paroxysmal atrial tachycardia, the run with the  fastest interval lasting 11 beats with a max rate of 158 bpm (avg 137 bpm); the run with the fastest interval was also the longest. Junctional Rhythm was present. Junctional Rhythm was detected within +/- 45 seconds of symptomatic patient event(s). Isolated SVEs were rare (<1.0%), SVE Couplets were rare (<1.0%), and no SVE Triplets were present. Isolated VEs were rare (<1.0%), and no VE Couplets or VE Triplets were present.  There was a total of 73 symptomatic events some say junctional however I can see P waves and all are sinus rhythm and unassociated with arrhythmia. Both monitor showed no significant sustained arrhythmia No atrial fibrillation or flutter no episodes of sinus pause second or third-degree AV nodal block. The strips noted to be junctional on my review show P waves.       ______________________________________________________________________________________________       EKG:  EKG is  ordered today.  The ekg ordered today demonstrates sinus rhythm, heart rate 78 bpm, consistent with prior EKG tracings.  Recent Labs: No results found for requested labs within last 365 days.  Recent Lipid Panel    Component Value Date/Time   CHOL 124 08/09/2017 0622   TRIG 203 (H) 08/09/2017 0622   HDL 31 (L) 08/09/2017 0622   CHOLHDL 4.0 08/09/2017 0622   VLDL 41 (H) 08/09/2017 0622   LDLCALC 52 08/09/2017 0622     Risk Assessment/Calculations:                Physical Exam:    VS:  BP 130/80 Comment: home  Pulse 71   Ht 4\' 10"  (1.473 m)   Wt 195 lb (88.5 kg)   SpO2 96%   BMI 40.76 kg/m     Wt Readings from Last 3 Encounters:  06/27/23 195 lb (88.5 kg)  10/08/22 197 lb 9.6 oz (89.6 kg)  03/21/22 189 lb (85.7 kg)     GEN:  Well nourished, well developed in no acute distress HEENT: Normal NECK: No JVD; No carotid bruits LYMPHATICS: No lymphadenopathy CARDIAC: RRR, no murmurs, rubs, gallops RESPIRATORY:  Clear to auscultation without rales, wheezing or rhonchi   ABDOMEN: Soft, non-tender, non-distended MUSCULOSKELETAL:  No edema; No deformity  SKIN: Warm and dry NEUROLOGIC:  Alert and oriented x 3 PSYCHIATRIC:  Normal affect   ASSESSMENT:    1. Dyspnea on exertion   2. Essential hypertension   3. Palpitations   4. Cerebrovascular accident (CVA) due to bilateral stenosis of middle cerebral arteries (HCC)   5. OSA on CPAP   6. Class 3 severe obesity due to excess calories with body mass index (BMI) of 40.0 to 44.9 in adult, unspecified whether serious comorbidity present (HCC)   7. COPD with asthma (HCC)      PLAN:    In order of problems listed above:  DOE-she does have underlying pulmonary issues however she feels like her shortness of breath is different than her typical lung issues.  This could be an anginal equivalent as she has comorbid conditions including obesity, hypertension and dyslipidemia.  I do think we need to evaluate her DOE further, will arrange for coronary CTA.  Bilateral lower extremity edema-no edema appreciated today, echo in 2023 revealed an EF 60 to 65%, grade 1 DD.  Continue Lasix 40 mg as needed.  Hypertension-blood pressure is elevated today at 160/80 however she is tearful, states that  was recently checked at her PCP office and 130/80.  She also checks it at home and gets readings around 130 systolic.  For now, we will continue to monitor.  Continue lisinopril 40 mg daily.  OSA/COPD/asthma-following closely with pulmonology, not able to tolerate her CPAP.  She is on Ventolin, Trelegy, DuoNeb--states she was told she has COPD versus asthma.  She is vacillating on whether she wants to follow-up with a different pulmonologist.  Class III obesity-previously discussed the possibility of GLP-1 agonist as well as offered referral to healthy weight and wellness, she politely declined both.  We discussed the importance of making small changes where possible both to her diet and with her physical activity. Heart healthy diet  and regular cardiovascular exercise encouraged.    CVA -multiple strokes over the years, s/p ILR implantation and removal, continue Plavix 75 mg daily, managed by PCP.  Palpitations-quiescent.  Disposition-coronary CTA, follow-up in 6 months.           Medication Adjustments/Labs and Tests Ordered: Current medicines are reviewed at length with the patient today.  Concerns regarding medicines are outlined above.  Orders Placed This Encounter  Procedures   CT CORONARY MORPH W/CTA COR W/SCORE W/CA W/CM &/OR WO/CM   Meds ordered this encounter  Medications   metoprolol tartrate (LOPRESSOR) 100 MG tablet    Sig: Take one tablet 2 hours before cardiac CT for heart greater than 55    Dispense:  1 tablet    Refill:  0    Patient Instructions  Medication Instructions:   TAKE: Metoprolol 100mg  1 tablet 2 hours prior to CT Scan   Lab Work: None Ordered If you have labs (blood work) drawn today and your tests are completely normal, you will receive your results only by: [employer] (if you have MyChart) OR A paper copy in the mail If you have any lab test that is abnormal or we need to change your treatment, we will call you to review the results.   Testing/Procedures:  Your cardiac CT will be scheduled at one of the below locations:   [facility] [address] [place], [place] [address] [phone]   If scheduled at [facility], please arrive at the [facility] and Children's Entrance (Entrance C2) of [facility] 30 minutes prior to test start time. You can use the FREE valet parking offered at entrance C (encouraged to control the heart rate for the test)  Proceed to the [facility] Radiology Department (first floor) to check-in and test prep.  All radiology patients and guests should use entrance C2 at [facility], accessed from [facility], even though the hospital's physical address listed is [address].      Please follow these instructions carefully (unless otherwise directed):    On the Night Before the Test: Be sure to Drink plenty of water. Do not consume any caffeinated/decaffeinated beverages or chocolate 12 hours prior to your test. Do not take any antihistamines 12 hours prior to your test.   On the Day of the Test: Drink plenty of water until 1 hour prior to the test. Do not eat any food 4 hours prior to the test. You may take your regular medications prior to the test.  Take metoprolol (Lopressor) two hours prior to test. HOLD Furosemide/Hydrochlorothiazide morning of the test. FEMALES- please wear underwire-free bra if available, avoid dresses & tight clothing        After the Test: Drink  plenty of water. After receiving IV contrast, you may experience a mild flushed feeling. This is normal. On occasion, you may experience a mild rash up to 24 hours after the test. This is not dangerous. If this occurs, you can take Benadryl 25 mg and increase your fluid intake. If you experience trouble breathing, this can be serious. If it is severe call 911 IMMEDIATELY. If it is mild, please call our office. If you take any of these medications: Glipizide/Metformin, Avandament, Glucavance, please do not take 48 hours after completing test unless otherwise instructed.  We will call to schedule your test 2-4 weeks out understanding that some insurance companies will need an authorization prior to the service being performed.   For non-scheduling related questions, please contact the cardiac imaging nurse navigator should you have any questions/concerns: Rockwell [place], Cardiac Imaging Nurse Navigator Larey Brick, Cardiac Imaging Nurse Navigator [facility] Direct Office Dial: [phone]   For scheduling needs, including cancellations and rescheduling, please call [place], [phone].    Follow-Up: At [facility], you and your health  needs are our priority.  As part of our continuing mission to provide you with exceptional heart care, we have created designated Provider Care Teams.  These Care Teams include your primary Cardiologist (physician) and Advanced Practice Providers (APPs -  Physician Assistants and Nurse Practitioners) who all work together to provide you with the care you need, when you need it.  We recommend signing up for the patient portal called "MyChart".  Sign up information is provided on this After Visit Summary.  MyChart is used to connect with patients for Virtual Visits (Telemedicine).  Patients are able to view lab/test results, encounter notes, upcoming appointments, etc.  Non-urgent messages can be sent to your provider as well.   To learn more about what you can do with MyChart, go to [email or web address].    Your next appointment:   6 month(s)  The format for your next appointment:   In Person  Provider:   Wallis Bamberg, NP   Other Instructions NA    Signed, Flossie Dibble, NP  06/27/2023 4:30 PM    [facility]

## 2023-07-01 DIAGNOSIS — M9903 Segmental and somatic dysfunction of lumbar region: Secondary | ICD-10-CM | POA: Diagnosis not present

## 2023-07-01 DIAGNOSIS — Z79891 Long term (current) use of opiate analgesic: Secondary | ICD-10-CM | POA: Diagnosis not present

## 2023-07-01 DIAGNOSIS — M9905 Segmental and somatic dysfunction of pelvic region: Secondary | ICD-10-CM | POA: Diagnosis not present

## 2023-07-01 DIAGNOSIS — M51362 Other intervertebral disc degeneration, lumbar region with discogenic back pain and lower extremity pain: Secondary | ICD-10-CM | POA: Diagnosis not present

## 2023-07-01 DIAGNOSIS — F1120 Opioid dependence, uncomplicated: Secondary | ICD-10-CM | POA: Diagnosis not present

## 2023-07-01 DIAGNOSIS — M9902 Segmental and somatic dysfunction of thoracic region: Secondary | ICD-10-CM | POA: Diagnosis not present

## 2023-07-03 DIAGNOSIS — M545 Low back pain, unspecified: Secondary | ICD-10-CM | POA: Diagnosis not present

## 2023-07-03 DIAGNOSIS — M791 Myalgia, unspecified site: Secondary | ICD-10-CM | POA: Diagnosis not present

## 2023-07-03 DIAGNOSIS — M797 Fibromyalgia: Secondary | ICD-10-CM | POA: Diagnosis not present

## 2023-07-08 DIAGNOSIS — M9902 Segmental and somatic dysfunction of thoracic region: Secondary | ICD-10-CM | POA: Diagnosis not present

## 2023-07-08 DIAGNOSIS — M9903 Segmental and somatic dysfunction of lumbar region: Secondary | ICD-10-CM | POA: Diagnosis not present

## 2023-07-08 DIAGNOSIS — M9905 Segmental and somatic dysfunction of pelvic region: Secondary | ICD-10-CM | POA: Diagnosis not present

## 2023-07-08 DIAGNOSIS — M51362 Other intervertebral disc degeneration, lumbar region with discogenic back pain and lower extremity pain: Secondary | ICD-10-CM | POA: Diagnosis not present

## 2023-07-15 DIAGNOSIS — M9905 Segmental and somatic dysfunction of pelvic region: Secondary | ICD-10-CM | POA: Diagnosis not present

## 2023-07-15 DIAGNOSIS — M9903 Segmental and somatic dysfunction of lumbar region: Secondary | ICD-10-CM | POA: Diagnosis not present

## 2023-07-15 DIAGNOSIS — M51362 Other intervertebral disc degeneration, lumbar region with discogenic back pain and lower extremity pain: Secondary | ICD-10-CM | POA: Diagnosis not present

## 2023-07-15 DIAGNOSIS — M9902 Segmental and somatic dysfunction of thoracic region: Secondary | ICD-10-CM | POA: Diagnosis not present

## 2023-07-16 ENCOUNTER — Ambulatory Visit (HOSPITAL_COMMUNITY)
Admission: RE | Admit: 2023-07-16 | Discharge: 2023-07-16 | Disposition: A | Discharge status: Home / Self Care | Payer: Medicare HMO | Source: Ambulatory Visit | Attending: Cardiology | Admitting: Cardiology

## 2023-07-16 DIAGNOSIS — R0609 Other forms of dyspnea: Secondary | ICD-10-CM | POA: Diagnosis not present

## 2023-07-16 DIAGNOSIS — I2510 Atherosclerotic heart disease of native coronary artery without angina pectoris: Secondary | ICD-10-CM | POA: Diagnosis not present

## 2023-07-16 MED ORDER — DILTIAZEM HCL 25 MG/5ML IV SOLN: 10.0000 mg | INTRAVENOUS | Status: DC | PRN

## 2023-07-16 MED ORDER — NITROGLYCERIN 0.4 MG SL SUBL
0.8000 mg | SUBLINGUAL_TABLET | Freq: Once | SUBLINGUAL | Status: AC
  Administered 2023-07-16: 0.8 mg via SUBLINGUAL

## 2023-07-16 MED ORDER — METOPROLOL TARTRATE 5 MG/5ML IV SOLN: 10.0000 mg | Freq: Once | INTRAVENOUS | Status: DC | PRN

## 2023-07-16 MED ORDER — NITROGLYCERIN 0.4 MG SL SUBL
SUBLINGUAL_TABLET | SUBLINGUAL | Status: AC
  Filled 2023-07-16: qty 2

## 2023-07-16 MED ORDER — IOHEXOL 350 MG/ML SOLN
100.0000 mL | Freq: Once | INTRAVENOUS | Status: AC | PRN
  Administered 2023-07-16: 100 mL via INTRAVENOUS

## 2023-07-19 ENCOUNTER — Telehealth: Payer: Self-pay | Admitting: Cardiology

## 2023-07-19 ENCOUNTER — Telehealth: Payer: Self-pay

## 2023-07-19 NOTE — Telephone Encounter (Signed)
 Spoke with pt. She stated that she was in a panic worried about her CT results and was short of breath in the grocery store. She stated she just don't know what to do. Please advise.

## 2023-07-19 NOTE — Telephone Encounter (Signed)
 Pt c/o Shortness Of Breath: STAT if SOB developed within the last 24 hours or pt is noticeably SOB on the phone  1. Are you currently SOB (can you hear that pt is SOB on the phone)?  Patient says she's out of breath right now from just talking to me  2. How long have you been experiencing SOB?  Off and on for a while  3. Are you SOB when sitting or when up moving around?  Both, but worsens with movement  4. Are you currently experiencing any other symptoms?  No

## 2023-07-19 NOTE — Telephone Encounter (Signed)
 Stat call from Call center- message taken and sent to Wallis Bamberg, NP- see phone note same date

## 2023-07-22 ENCOUNTER — Telehealth: Payer: Self-pay | Admitting: Emergency Medicine

## 2023-07-22 NOTE — Telephone Encounter (Signed)
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 It appears that you are getting good blood flow around the plaque so it would not be causing you to feel short of breath. You do need to work on weight loss, please arrange for her to come back so we can discuss lifestyle changes. If she is agreeable, then we can check labs at that time. If she does not want to come back to discuss wt loss, then we need to check some labs on you:  LPA, fasting lipid panel and CMET. We will need to start you on a medication for your cholesterol after we get the lab results back.

## 2023-07-22 NOTE — Telephone Encounter (Signed)
 Called and spoke to patient and reviewed Jimmy Footman recommendation for patient to come in for an office visit to discuss results. Appointment made for 07/30/2023. She had no further questions.

## 2023-07-24 DIAGNOSIS — R0602 Shortness of breath: Secondary | ICD-10-CM | POA: Diagnosis not present

## 2023-07-24 DIAGNOSIS — J449 Chronic obstructive pulmonary disease, unspecified: Secondary | ICD-10-CM | POA: Diagnosis not present

## 2023-07-24 DIAGNOSIS — I10 Essential (primary) hypertension: Secondary | ICD-10-CM | POA: Diagnosis not present

## 2023-07-24 DIAGNOSIS — M9905 Segmental and somatic dysfunction of pelvic region: Secondary | ICD-10-CM | POA: Diagnosis not present

## 2023-07-24 DIAGNOSIS — M51362 Other intervertebral disc degeneration, lumbar region with discogenic back pain and lower extremity pain: Secondary | ICD-10-CM | POA: Diagnosis not present

## 2023-07-24 DIAGNOSIS — I509 Heart failure, unspecified: Secondary | ICD-10-CM | POA: Diagnosis not present

## 2023-07-24 DIAGNOSIS — R030 Elevated blood-pressure reading, without diagnosis of hypertension: Secondary | ICD-10-CM | POA: Diagnosis not present

## 2023-07-24 DIAGNOSIS — M9903 Segmental and somatic dysfunction of lumbar region: Secondary | ICD-10-CM | POA: Diagnosis not present

## 2023-07-24 DIAGNOSIS — M9902 Segmental and somatic dysfunction of thoracic region: Secondary | ICD-10-CM | POA: Diagnosis not present

## 2023-07-24 DIAGNOSIS — R0600 Dyspnea, unspecified: Secondary | ICD-10-CM | POA: Diagnosis not present

## 2023-07-29 DIAGNOSIS — M51362 Other intervertebral disc degeneration, lumbar region with discogenic back pain and lower extremity pain: Secondary | ICD-10-CM | POA: Diagnosis not present

## 2023-07-29 DIAGNOSIS — M9905 Segmental and somatic dysfunction of pelvic region: Secondary | ICD-10-CM | POA: Diagnosis not present

## 2023-07-29 DIAGNOSIS — M9902 Segmental and somatic dysfunction of thoracic region: Secondary | ICD-10-CM | POA: Diagnosis not present

## 2023-07-29 DIAGNOSIS — M9903 Segmental and somatic dysfunction of lumbar region: Secondary | ICD-10-CM | POA: Diagnosis not present

## 2023-07-30 ENCOUNTER — Ambulatory Visit: Admitting: Cardiology

## 2023-07-30 DIAGNOSIS — R0609 Other forms of dyspnea: Secondary | ICD-10-CM | POA: Diagnosis not present

## 2023-08-02 ENCOUNTER — Encounter: Payer: Self-pay | Admitting: Cardiology

## 2023-08-02 ENCOUNTER — Ambulatory Visit: Attending: Cardiology | Admitting: Cardiology

## 2023-08-02 VITALS — BP 139/79 | HR 64 | Ht <= 58 in | Wt 195.2 lb

## 2023-08-02 DIAGNOSIS — G4733 Obstructive sleep apnea (adult) (pediatric): Secondary | ICD-10-CM | POA: Diagnosis not present

## 2023-08-02 DIAGNOSIS — I63513 Cerebral infarction due to unspecified occlusion or stenosis of bilateral middle cerebral arteries: Secondary | ICD-10-CM

## 2023-08-02 DIAGNOSIS — Z79899 Other long term (current) drug therapy: Secondary | ICD-10-CM

## 2023-08-02 DIAGNOSIS — I2510 Atherosclerotic heart disease of native coronary artery without angina pectoris: Secondary | ICD-10-CM | POA: Diagnosis not present

## 2023-08-02 DIAGNOSIS — I779 Disorder of arteries and arterioles, unspecified: Secondary | ICD-10-CM | POA: Diagnosis not present

## 2023-08-02 DIAGNOSIS — Z131 Encounter for screening for diabetes mellitus: Secondary | ICD-10-CM | POA: Diagnosis not present

## 2023-08-02 DIAGNOSIS — I10 Essential (primary) hypertension: Secondary | ICD-10-CM

## 2023-08-02 NOTE — Progress Notes (Signed)
 Cardiology Office Note:    Date:  08/02/2023   ID:  Lynn Rollins, DOB Sep 26, 1965, MRN [id number]  PCP:  Paulina Fusi, MD   Lynn Rollins Cardiologist:  Norman Herrlich, MD     Referring MD: Paulina Fusi, MD     History of Present Illness:    Lynn Rollins is a 58 y.o. female with a hx of hypertension, CAD, cryptogenic CVA, migraine, OSA on CPAP, GERD, IBS, chronic pain, fibromyalgia, hyperlipidemia, depression, anxiety, mild bilateral carotid artery stenosis.  07/16/2023 coronary CTA calcium score 325, 99th percentile, mild nonobstructive CAD to the proximal LAD 04/16/2022 monitor average heart rate 67 bpm, predominant rhythm was sinus, 2 episodes of SVT 04/06/2022 Echo EF 60 to 65%, grade 1 DD, no valvular abnormalities.   Previously had an ILR that was removed in 2022 secondary to cryptogenic stroke. 10/01/2016 transesophageal echo EF 60-65%, aortic was mildly calcified 09/29/2016 carotid duplex mild bilateral carotid artery stenosis  Evaluated by Dr. Dulce Sellar on 03/21/2022 at this time her blood pressure was very elevated at 192/98, she was experiencing bilateral lower extremity edema.  ProBNP was 36.  An echocardiogram was arranged and completed on 04/06/2022 which revealed an EF of 60 to 65%, grade 1 DD, no valvular abnormalities.  A monitor was arranged which showed predominant underlying rhythm was sinus.   Evaluated 10/08/2022 for follow-up of her hypertension, most bothered by weight gain, also working with pulmonology for management of her CPAP.  Offered to refer her to healthy weight and wellness but she ultimately declined.  Evaluated by myself on 06/27/2023 for DOE, she was also dealing with a lot of her anxiety as some of her anxiolytics had been stopped by her pain management specialist.  We arrange for coronary CTA which revealed nonobstructive CAD to the proximal LAD.  She presents today accompanied by her husband for follow-up after her abnormal  coronary CTA.  We arrange this as she was having episodes of shortness of breath and with other comorbid conditions could be an anginal equivalent.  Coronary CTA did reveal that she has coronary artery disease however was not hemodynamically significant.  She is relieved to hear this.  We did discuss lifestyle modifications, mainly weight loss, this been very difficult for her and I suggested we consider starting [facility] however she has a lot of digestive issues and would like to speak with her PCP about this. She denies chest pain, palpitations, dyspnea, pnd, orthopnea, n, v, dizziness, syncope, edema, weight gain, or early satiety.    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 pylori infection    Hearing loss of left ear 09/09/2017   Hemorrhoid    Hiatal hernia with GERD    History of loop recorder 08/25/2020   History of stroke  Hyperlipemia    Hypoglycemia    IBS (irritable bowel syndrome)    Migraine    Morbid obesity (HCC)    OSA on CPAP    Scalp psoriasis    Sensorineural hearing loss (SNHL) of left ear with unrestricted hearing of right ear 09/26/2017   Formatting of this note might be different from the original. Onset 07/2017 with CVA.  MRI brain just showed old infarcts. Last Assessment & Plan: Formatting of this note might be different from the original. Follow-up  sudden left sensorineural hearing loss. Onset back in April.  She was treated at [facility] for stroke.  She was given steroids.  Unfortunately hearing is still very bad.  Some unste   Stroke [facility])    Vitamin D deficiency     Past Surgical History:  Procedure Laterality Date   ABDOMINAL HYSTERECTOMY     CHOLECYSTECTOMY     COLONOSCOPY  04/28/2015   Mild sigmoid diverticulosis. Otherwise normal colonoscopy   ESOPHAGOGASTRODUODENOSCOPY  09/11/2013   Mild gastritis. Otherwise normal esophagogastroduodenoscopy   LOOP RECORDER INSERTION N/A 10/01/2016   Procedure: Loop Recorder Insertion;  Surgeon: Duke Salvia, MD;  Location: [facility] INVASIVE CV LAB;  Service: Cardiovascular;  Laterality: N/A;   LOOP RECORDER REMOVAL N/A 09/07/2020   Procedure: LOOP RECORDER REMOVAL;  Surgeon: Duke Salvia, MD;  Location: [facility] INVASIVE CV LAB;  Service: Cardiovascular;  Laterality: N/A;   OTHER SURGICAL HISTORY     screw in head. Bone anch [place] hearing aid   TEE WITHOUT CARDIOVERSION N/A 10/01/2016   Procedure: TRANSESOPHAGEAL ECHOCARDIOGRAM (TEE);  Surgeon: Vesta Mixer, MD;  Location: [facility] ENDOSCOPY;  Service: Cardiovascular;  Laterality: N/A;   TUBAL LIGATION      Current Medications: Current Meds  Medication Sig   albuterol (PROVENTIL HFA;VENTOLIN HFA) 108 (90 BASE) MCG/ACT inhaler Inhale 2 puffs into the lungs every 4 (four) hours as needed for wheezing.    clobetasol (TEMOVATE) 0.05 % external solution Apply 1 Application topically 2 (two) times daily.   clopidogrel (PLAVIX) 75 MG tablet Take 75 mg by mouth daily.   DEXILANT 60 MG capsule Take 60 mg by mouth daily.   dicyclomine (BENTYL) 20 MG tablet Take 20 mg by mouth 4 (four) times daily.   famotidine (PEPCID) 40 MG tablet Take 1 tablet by mouth daily.   Fluticasone-Umeclidin-Vilant (TRELEGY ELLIPTA) 200-62.5-25 MCG/ACT AEPB Inhale 1 puff into the lungs daily.   furosemide (LASIX) 40 MG tablet Take 40 mg by mouth daily.   gabapentin (NEURONTIN)  600 MG tablet Take 600 mg by mouth 3 (three) times daily.   ipratropium-albuterol (DUONEB) 0.5-2.5 (3) MG/3ML SOLN Inhale 3 mLs into the lungs every 6 (six) hours as needed (for shortness of breath or wheezing).    labetalol (NORMODYNE) 100 MG tablet Take 100 mg by mouth 2 (two) times daily.   lisinopril (PRINIVIL,ZESTRIL) 40 MG tablet Take 40 mg by mouth daily.   meclizine (ANTIVERT) 25 MG tablet Take 25 mg by mouth 3 (three) times daily as needed for dizziness.   montelukast (SINGULAIR) 10 MG tablet Take 10 mg by mouth daily.   Omega-3 Fatty Acids (FISH OIL) 1000 MG CAPS Take 1,000 mg by mouth 2 (two) times daily.   OXYCONTIN 10 MG 12 hr tablet Take 10 mg by mouth QID.    temazepam (RESTORIL) 30 MG capsule Take 30 mg by mouth at bedtime as needed for sleep.   triamcinolone cream (KENALOG) 0.1 % Apply 1 Application topically 2 (two) times daily.  Vitamin D, Ergocalciferol, (DRISDOL) 50000 units CAPS capsule Take 50,000 Units by mouth every Saturday.      Allergies:   Celexa [citalopram hydrobromide], Tape, Broccoli [brassica oleracea], and Statins   Social History   Socioeconomic History   Marital status: Married    Spouse name: Not on file   Number of children: Not on file   Years of education: Not on file   Highest education level: Not on file  Occupational History   Not on file  Tobacco Use   Smoking status: Never   Smokeless tobacco: Never  Vaping Use   Vaping status: Never Used  Substance and Sexual Activity   Alcohol use: No   Drug use: No   Sexual activity: Not on file  Other Topics Concern   Not on file  Social History Narrative   Lives at home with Hessie Diener, husband.  She is disabled.  Education graduate.  2 children.  Caffeine 2 drinks per day.  (most).   Social Drivers of Corporate investment banker Strain: Not on file  Food Insecurity: Not on file  Transportation Needs: Not on file  Physical Activity: Not on file  Stress: Not on file  Social Connections: Not on  file     Family History: The patient's family history includes Alcohol abuse in her father; Asthma in her paternal grandmother; Diabetes in her maternal grandmother and paternal grandmother; Heart attack in her father, maternal grandmother, mother, and paternal grandmother; Heart disease in her father, maternal grandfather, maternal grandmother, paternal grandfather, and paternal grandmother; Hypertension in her father, maternal grandfather, maternal grandmother, mother, paternal grandfather, and paternal grandmother; Multiple sclerosis in her brother; Other in her father; Parkinson's disease in her father; Stroke in her father, maternal grandfather, maternal grandmother, mother, paternal grandfather, and paternal grandmother; Thyroid disease in her sister. There is no history of Esophageal cancer.  ROS:   Please see the history of present illness.    All other systems reviewed and are negative.  EKGs/Labs/Other Studies Reviewed:    The following studies were reviewed today: Cardiac Studies & Procedures   ______________________________________________________________________________________________     ECHOCARDIOGRAM  ECHOCARDIOGRAM COMPLETE 04/06/2022  Narrative ECHOCARDIOGRAM REPORT    Patient Name:   SREEJA SPIES Date of Exam: 04/06/2022 Medical Rec #:  [id number]        Height:       59.0 in Accession #:    [id number]       Weight:       189.0 lb Date of Birth:  23-May-1965        BSA:          1.801 m Patient Age:    56 years         BP:           192/98 mmHg Patient Gender: F                HR:           69 bpm. Exam Location:  [place]  Procedure: 2D Echo, Cardiac Doppler and Color Doppler  Indications:    Bilateral lower extremity edema [R60.0]. Essential hypertension [I10]. Palpitations. COPD with asthma [J44.89] Other chronic pain [G89.29]  History:        Patient has prior history of Echocardiogram examinations, most recent 08/09/2017.  Signs/Symptoms:Dyspnea.  Sonographer:    Margreta Journey RDCS Referring Phys: [id number] BRIAN [facility]   Sonographer Comments: Technically difficult study due to poor echo windows. Image acquisition challenging due to  patient body habitus. Global longitudinal strain was attempted. Restricted mobility. Breast intertrigo. IMPRESSIONS   1. TDS and limited study. Mild ASH noted, no LVOT gradient. Left ventricular ejection fraction, by estimation, is 60 to 65%. The left ventricle has normal function. The left ventricle has no regional wall motion abnormalities. Left ventricular diastolic parameters are consistent with Grade I diastolic dysfunction (impaired relaxation). 2. Right ventricular systolic function is normal. The right ventricular size is normal. 3. The mitral valve is normal in structure. No evidence of mitral valve regurgitation. No evidence of mitral stenosis. 4. The aortic valve is normal in structure. Aortic valve regurgitation is not visualized. No aortic stenosis is present. 5. The inferior vena cava is normal in size with greater than 50% respiratory variability, suggesting right atrial pressure of 3 mmHg.  FINDINGS Left Ventricle: TDS and limited study. Mild ASH noted, no LVOT gradient. Left ventricular ejection fraction, by estimation, is 60 to 65%. The left ventricle has normal function. The left ventricle has no regional wall motion abnormalities. The left ventricular internal cavity size was normal in size. There is no left ventricular hypertrophy. Left ventricular diastolic parameters are consistent with Grade I diastolic dysfunction (impaired relaxation).  Right Ventricle: The right ventricular size is normal. No increase in right ventricular wall thickness. Right ventricular systolic function is normal.  Left Atrium: Left atrial size was normal in size.  Right Atrium: Right atrial size was normal in size.  Pericardium: There is no evidence of pericardial  effusion.  Mitral Valve: The mitral valve is normal in structure. No evidence of mitral valve regurgitation. No evidence of mitral valve stenosis.  Tricuspid Valve: The tricuspid valve is normal in structure. Tricuspid valve regurgitation is not demonstrated. No evidence of tricuspid stenosis.  Aortic Valve: The aortic valve is normal in structure. Aortic valve regurgitation is not visualized. No aortic stenosis is present.  Pulmonic Valve: The pulmonic valve was normal in structure. Pulmonic valve regurgitation is not visualized. No evidence of pulmonic stenosis.  Aorta: The aortic root is normal in size and structure.  Venous: The inferior vena cava is normal in size with greater than 50% respiratory variability, suggesting right atrial pressure of 3 mmHg.  IAS/Shunts: No atrial level shunt detected by color flow Doppler.   LEFT VENTRICLE PLAX 2D LVIDd:         3.70 cm   Diastology LVIDs:         3.10 cm   LV e' medial:    5.44 cm/s LV PW:         1.10 cm   LV E/e' medial:  12.4 LV IVS:        1.30 cm   LV e' lateral:   6.20 cm/s LVOT diam:     1.90 cm   LV E/e' lateral: 10.9 LV SV:         47 LV SV Index:   26 LVOT Area:     2.84 cm   LEFT ATRIUM           Index LA diam:      3.00 cm 1.67 cm/m LA Vol (A4C): 60.6 ml 33.66 ml/m AORTIC VALVE LVOT Vmax:   77.00 cm/s LVOT Vmean:  54.000 cm/s LVOT VTI:    0.165 m  AORTA Ao Root diam: 3.00 cm Ao Asc diam:  2.70 cm Ao Desc diam: 1.90 cm  MITRAL VALVE MV Area (PHT): 3.08 cm    SHUNTS MV Decel Time: 246 msec    Systemic VTI:  0.16 m MV E velocity: 67.70 cm/s  Systemic Diam: 1.90 cm MV A velocity: 81.00 cm/s MV E/A ratio:  0.84  Gypsy Balsam MD Electronically signed by Gypsy Balsam MD Signature Date/Time: 04/08/2022/11:40:46 AM    Final   TEE  ECHO TEE 10/01/2016  Narrative *[facility]* *[facility]* [address] [place], [place]  [address] [phone]  ------------------------------------------------------------------- Transesophageal Echocardiography  Patient:    Deauna, Yaw MR #:       [id number] Study Date: 10/01/2016 Gender:     F Age:        50 Height:     147.3 cm Weight:     81.4 kg BSA:        1.87 m^2 Pt. Status: Room:       5C18C  PERFORMING   Kristeen Miss, M.D. ATTENDING    Hongalgi, Azucena Kuba REFERRING    Arty Baumgartner SONOGRAPHER  Dance, Tiffany  cc:  ------------------------------------------------------------------- LV EF: 60% -   65%  ------------------------------------------------------------------- Indications:      CVA 436.  ------------------------------------------------------------------- Study Conclusions  - Left ventricle: The cavity size was normal. Wall thickness was normal. Systolic function was normal. The estimated ejection fraction was in the range of 60% to 65%. - Aortic valve: No evidence of vegetation. - Left atrium: No evidence of thrombus in the atrial cavity or appendage. - Atrial septum: No defect or patent foramen ovale was identified.  ------------------------------------------------------------------- Study data:   Study status:  Routine.  Consent:  The risks, benefits, and alternatives to the procedure and sedation were explained to the patient and informed consent was obtained. Procedure:  The patient reported no pain pre or post test. Initial setup. The patient was brought to the laboratory. Surface ECG leads were monitored. Sedation. Conscious sedation was administered. Transesophageal echocardiography. An adult multiplane transesophageal probe was inserted by the attending cardiologistwithout difficulty. Image quality was adequate.  Study completion:  The patient tolerated the procedure well. There were no complications.  Administered medications:   Fentanyl, ,  IV. Midazolam, 7mg , IV.          Diagnostic transesophageal echocardiography.  2D and color Doppler.  Birthdate:  Patient birthdate: 1966-04-11.  Age:  Patient is 58 yr old.  Sex:  Gender: female.    BMI: 37.5 kg/m^2.  Blood pressure:     156/70  Patient status:  Inpatient.  Study date:  Study date: 10/01/2016. Study time: 01:57 PM.  Location:  Endoscopy.  -------------------------------------------------------------------  ------------------------------------------------------------------- Left ventricle:  The cavity size was normal. Wall thickness was normal. Systolic function was normal. The estimated ejection fraction was in the range of 60% to 65%.  ------------------------------------------------------------------- Aortic valve:   Structurally normal valve.   Cusp separation was normal.  No evidence of vegetation.  Doppler:  There was no regurgitation.  ------------------------------------------------------------------- Aorta:  The aorta was mildly calcified.  ------------------------------------------------------------------- Left atrium:   No evidence of thrombus in the atrial cavity or appendage.  ------------------------------------------------------------------- Atrial septum:  No defect or patent foramen ovale was identified.  ------------------------------------------------------------------- Post procedure conclusions Ascending Aorta:  - The aorta was mildly calcified.  ------------------------------------------------------------------- Prepared and Electronically Authenticated by  Kristeen Miss, M.D. 2018-06-04T17:52:48  MONITORS  LONG TERM MONITOR (3-14 DAYS) 04/13/2022  Narrative Patch Wear Time:  8 days and 10 hours (2023-11-22T14:59:39-0500 to 2023-12-04T17:29:10-498)  Monitor 1 Patient had a min HR of 51 bpm, max HR of 100  bpm, and avg HR of 67 bpm. Predominant underlying rhythm was Sinus Rhythm. Isolated SVEs were rare (<1.0%), and no SVE  Couplets or SVE Triplets were present. Isolated VEs were rare (<1.0%), and no VE Couplets or VE Triplets were present.  Monitor 2 Patient had a min HR of 52 bpm, max HR of 158 bpm, and avg HR of 66 bpm. Predominant underlying rhythm was Sinus Rhythm. 2 Supraventricular Tachycardia runs occurred, both paroxysmal atrial tachycardia, the run with the fastest interval lasting 11 beats with a max rate of 158 bpm (avg 137 bpm); the run with the fastest interval was also the longest. Junctional Rhythm was present. Junctional Rhythm was detected within +/- 45 seconds of symptomatic patient event(s). Isolated SVEs were rare (<1.0%), SVE Couplets were rare (<1.0%), and no SVE Triplets were present. Isolated VEs were rare (<1.0%), and no VE Couplets or VE Triplets were present.  There was a total of 73 symptomatic events some say junctional however I can see P waves and all are sinus rhythm and unassociated with arrhythmia. Both monitor showed no significant sustained arrhythmia No atrial fibrillation or flutter no episodes of sinus pause second or third-degree AV nodal block. The strips noted to be junctional on my review show P waves.   CT SCANS  CT CORONARY MORPH W/CTA COR W/SCORE 07/16/2023  Addendum 08/01/2023  1:26 AM ADDENDUM REPORT: 08/01/2023 01:23  EXAM: OVER-READ INTERPRETATION  CT CHEST  The following report is an over-read performed by radiologist Dr. Myra Gianotti [facility] on 08/01/2023. This over-read does not include interpretation of cardiac or coronary anatomy or pathology. The coronary CTA interpretation by the cardiologist is attached.  COMPARISON:  None.  FINDINGS: No central pulmonary embolus. Small hiatal hernia. No visualized focal consolidation, pleural effusion, or pneumothorax. Hepatic steatosis. No acute osseous abnormality.  IMPRESSION: No acute abnormality.   Electronically Signed By: Minerva Fester M.D. On: 08/01/2023  01:23  Narrative CLINICAL DATA:  This is 58 year old female with anginal symptoms  EXAM: Cardiac/Coronary  CTA  TECHNIQUE: The patient was scanned on a [employer].  FINDINGS: A 100 kV prospective scan was triggered in the descending thoracic aorta at 111 HU's. Axial non-contrast 3 mm slices were carried out through the heart. The data set was analyzed on a dedicated work station and scored using the Agatson method. Gantry rotation speed was 250 msecs and collimation was .6 mm. No beta blockade and 0.8 mg of sl NTG was given. The 3D data set was reconstructed in 5% intervals of the 67-82 % of the R-R cycle. Diastolic phases were analyzed on a dedicated work station using MPR, MIP and VRT modes. The patient received 80 cc of contrast.  Aorta: Normal size.  No calcifications.  No dissection.  Aortic Valve:  Trileaflet.  No calcifications.  Coronary Arteries:  Normal coronary origin.  Right dominance.  RCA is a large dominant artery that gives rise to PDA and PLA. There is minimal (<25%) soft plaque in the proximal RCA. The mid and distal RCA with no plaques.  Left main is a large artery that gives rise to LAD and LCX arteries.  LAD is a large vessel. There is a mild (25-49%) calcified plaque in that proximal LAD. The mid and distal LAD with no plaques.  LCX is a non-dominant artery that gives rise to one large OM1 branch. There is no plaque.  Coronary Calcium Score:  Left main: 0  Left anterior descending artery: 325  Left circumflex  artery: 0  Right coronary artery: 0  Total: 325  Percentile: 99  Other findings:  Normal pulmonary vein drainage into the left atrium.  Normal left atrial appendage without a thrombus.  Normal size of the pulmonary artery.  IMPRESSION: 1. Coronary calcium score of 325. This was 34 percentile for age and sex matched control.  2. Normal coronary origin with right dominance.  3.CAD-RADS 2. Mild non-obstructive CAD  (25-49%). Consider non-atherosclerotic causes of chest pain. Consider preventive therapy and risk factor modification.  4. Total plaque volume is 172 mm3 which is 71st Percentile for age- and sex-matched controls (calcified plaque 47 mm3; non-calcified plaque 125 mm3, Low attenuation 15mm3). TPV is moderate.  The noncardiac portion of this study will be interpreted in separate report by the radiologist.  Electronically Signed: By: Thomasene Ripple D.O. On: 07/17/2023 11:31     ______________________________________________________________________________________________       EKG:  EKG is  ordered today.  The ekg ordered today demonstrates sinus rhythm, heart rate 78 bpm, consistent with prior EKG tracings.  Recent Labs: No results found for requested labs within last 365 days.  Recent Lipid Panel    Component Value Date/Time   CHOL 124 08/09/2017 0622   TRIG 203 (H) 08/09/2017 0622   HDL 31 (L) 08/09/2017 0622   CHOLHDL 4.0 08/09/2017 0622   VLDL 41 (H) 08/09/2017 0622   LDLCALC 52 08/09/2017 0622     Risk Assessment/Calculations:                Physical Exam:    VS:  BP 139/79   Pulse 64   Ht 4\' 10"  (1.473 m)   Wt 195 lb 3.2 oz (88.5 kg)   SpO2 97%   BMI 40.80 kg/m     Wt Readings from Last 3 Encounters:  08/02/23 195 lb 3.2 oz (88.5 kg)  06/27/23 195 lb (88.5 kg)  10/08/22 197 lb 9.6 oz (89.6 kg)     GEN:  Well nourished, well developed in no acute distress HEENT: Normal NECK: No JVD; No carotid bruits LYMPHATICS: No lymphadenopathy CARDIAC: RRR, no murmurs, rubs, gallops RESPIRATORY:  Clear to auscultation without rales, wheezing or rhonchi  ABDOMEN: Soft, non-tender, non-distended MUSCULOSKELETAL:  No edema; No deformity  SKIN: Warm and dry NEUROLOGIC:  Alert and oriented x 3 PSYCHIATRIC:  Normal affect   ASSESSMENT:    1. Coronary artery disease involving native coronary artery of native heart without angina pectoris   2. Essential  hypertension   3. OSA on CPAP   4. Cerebrovascular accident (CVA) due to bilateral stenosis of middle cerebral arteries (HCC)   5. Carotid artery disease, unspecified laterality, unspecified type (HCC)   6. Screening for diabetes mellitus   7. Medication management       PLAN:    In order of problems listed above:  CAD-nonobstructive per coronary CTA, there was soft plaque noted.  She is already on Plavix for history of strokes.  Will check fasting lipid panel, liver function testing, also LP(a) and plan to start her on lipid-lowering medication. Heart healthy diet and regular cardiovascular exercise encouraged.  Will check CMET, fasting lipid panel, LP(a).  Bilateral lower extremity edema-no edema appreciated today, echo in 2023 revealed an EF 60 to 65%, grade 1 DD.  Continue Lasix 40 mg as needed.  Hypertension-blood pressure is controlled at 139/7, currently on lisinopril 40 mg daily, labetalol 100 mg twice daily.  OSA/COPD/asthma-following closely with pulmonology, not able to tolerate her CPAP.  She  is on Ventolin, Trelegy, DuoNeb--states she was told she has COPD versus asthma.  She is vacillating on whether she wants to follow-up with a different pulmonologist.  Class III obesity-we have discussed this on a few occasions is contributing to her symptoms of shortness of breath.  She now has known coronary artery disease.  I have discussed having her meet with healthy weight and wellness on several occasions but she does not feel she be able to make the trip to [place].  I did discuss with her starting Reginal Lutes today however she would like to discuss this with her PCP first.  CVA -multiple strokes over the years, s/p ILR implantation and removal, continue Plavix 75 mg daily, managed by PCP.  Palpitations-quiescent.  Screening for diabetes-will check A1c as she mentions her previous A1c's have been borderline.  Carotid artery stenosis-this was mild on imaging in 2018, will repeat  carotid duplex for surveillance.  Disposition-Labs per above, carotid duplex, follow up in 1 year.       Medication Adjustments/Labs and Tests Ordered: Current medicines are reviewed at length with the patient today.  Concerns regarding medicines are outlined above.  Orders Placed This Encounter  Procedures   Lipoprotein A (LPA)   Basic metabolic panel with GFR   CBC with Differential/Platelet   Magnesium   LDL cholesterol, direct   Hemoglobin A1c   VAS US CAROTID   No orders of the defined types were placed in this encounter.   Patient Instructions  Medication Instructions:  Your physician recommends that you continue on your current medications as directed. Please refer to the Current Medication list given to you today.  *If you need a refill on your cardiac medications before your next appointment, please call your pharmacy*  Lab Work: Your physician recommends that you return for lab work in: Today for A1C, BMP, Magnesium, CBC, Direct LDL and LPa  If you have labs (blood work) drawn today and your tests are completely normal, you will receive your results only by: MyChart Message (if you have MyChart) OR A paper copy in the mail If you have any lab test that is abnormal or we need to change your treatment, we will call you to review the results.  Testing/Procedures: NONE  Follow-Up: At [facility], you and your health needs are our priority.  As part of our continuing mission to provide you with exceptional heart care, our Rollins are all part of one team.  This team includes your primary Cardiologist (physician) and Advanced Practice Rollins or APPs (Physician Assistants and Nurse Practitioners) who all work together to provide you with the care you need, when you need it.  Your next appointment:   1 year(s)  Provider:   Norman Herrlich, MD    We recommend signing up for the patient portal called "MyChart".  Sign up information is provided on this After  Visit Summary.  MyChart is used to connect with patients for Virtual Visits (Telemedicine).  Patients are able to view lab/test results, encounter notes, upcoming appointments, etc.  Non-urgent messages can be sent to your provider as well.   To learn more about what you can do with MyChart, go to [email or web address].   Other Instructions Discuss [id number] with PCP         Signed, Flossie Dibble, NP  08/02/2023 4:23 PM    [facility]

## 2023-08-02 NOTE — Patient Instructions (Addendum)
 Medication Instructions:  Your physician recommends that you continue on your current medications as directed. Please refer to the Current Medication list given to you today.  *If you need a refill on your cardiac medications before your next appointment, please call your pharmacy*  Lab Work: Your physician recommends that you return for lab work in: Today for A1C, BMP, Magnesium, CBC, Direct LDL and LPa  If you have labs (blood work) drawn today and your tests are completely normal, you will receive your results only by: MyChart Message (if you have MyChart) OR A paper copy in the mail If you have any lab test that is abnormal or we need to change your treatment, we will call you to review the results.  Testing/Procedures: NONE  Follow-Up: At [facility], you and your health needs are our priority.  As part of our continuing mission to provide you with exceptional heart care, our providers are all part of one team.  This team includes your primary Cardiologist (physician) and Advanced Practice Providers or APPs (Physician Assistants and Nurse Practitioners) who all work together to provide you with the care you need, when you need it.  Your next appointment:   1 year(s)  Provider:   Norman Herrlich, MD    We recommend signing up for the patient portal called "MyChart".  Sign up information is provided on this After Visit Summary.  MyChart is used to connect with patients for Virtual Visits (Telemedicine).  Patients are able to view lab/test results, encounter notes, upcoming appointments, etc.  Non-urgent messages can be sent to your provider as well.   To learn more about what you can do with MyChart, go to [email or web address].   Other Instructions Discuss [id number] with PCP

## 2023-08-05 DIAGNOSIS — I2510 Atherosclerotic heart disease of native coronary artery without angina pectoris: Secondary | ICD-10-CM

## 2023-08-05 DIAGNOSIS — M9902 Segmental and somatic dysfunction of thoracic region: Secondary | ICD-10-CM | POA: Diagnosis not present

## 2023-08-05 DIAGNOSIS — M9903 Segmental and somatic dysfunction of lumbar region: Secondary | ICD-10-CM | POA: Diagnosis not present

## 2023-08-05 DIAGNOSIS — M51362 Other intervertebral disc degeneration, lumbar region with discogenic back pain and lower extremity pain: Secondary | ICD-10-CM | POA: Diagnosis not present

## 2023-08-05 DIAGNOSIS — M9905 Segmental and somatic dysfunction of pelvic region: Secondary | ICD-10-CM | POA: Diagnosis not present

## 2023-08-05 LAB — BASIC METABOLIC PANEL WITH GFR
BUN/Creatinine Ratio: 12 (ref 9–23)
BUN: 8 mg/dL (ref 6–24)
CO2: 24 mmol/L (ref 20–29)
Calcium: 10 mg/dL (ref 8.7–10.2)
Chloride: 100 mmol/L (ref 96–106)
Creatinine, Ser: 0.68 mg/dL (ref 0.57–1.00)
Glucose: 114 mg/dL — ABNORMAL HIGH (ref 70–99)
Potassium: 4.7 mmol/L (ref 3.5–5.2)
Sodium: 141 mmol/L (ref 134–144)
eGFR: 102 mL/min/1.73

## 2023-08-05 LAB — LDL CHOLESTEROL, DIRECT: LDL Direct: 75 mg/dL (ref 0–99)

## 2023-08-05 LAB — CBC WITH DIFFERENTIAL/PLATELET
Basophils Absolute: 0.1 x10E3/uL (ref 0.0–0.2)
Basos: 1 %
EOS (ABSOLUTE): 0.3 x10E3/uL (ref 0.0–0.4)
Eos: 3 %
Hematocrit: 43.4 % (ref 34.0–46.6)
Hemoglobin: 14 g/dL (ref 11.1–15.9)
Immature Grans (Abs): 0 x10E3/uL (ref 0.0–0.1)
Immature Granulocytes: 0 %
Lymphocytes Absolute: 4.1 x10E3/uL — ABNORMAL HIGH (ref 0.7–3.1)
Lymphs: 44 %
MCH: 29.9 pg (ref 26.6–33.0)
MCHC: 32.3 g/dL (ref 31.5–35.7)
MCV: 93 fL (ref 79–97)
Monocytes Absolute: 0.7 x10E3/uL (ref 0.1–0.9)
Monocytes: 7 %
Neutrophils Absolute: 4.1 x10E3/uL (ref 1.4–7.0)
Neutrophils: 45 %
Platelets: 305 x10E3/uL (ref 150–450)
RBC: 4.69 x10E6/uL (ref 3.77–5.28)
RDW: 12.8 % (ref 11.7–15.4)
WBC: 9.2 x10E3/uL (ref 3.4–10.8)

## 2023-08-05 LAB — HEMOGLOBIN A1C
Est. average glucose Bld gHb Est-mCnc: 131 mg/dL
Hgb A1c MFr Bld: 6.2 % — ABNORMAL HIGH (ref 4.8–5.6)

## 2023-08-05 LAB — MAGNESIUM: Magnesium: 1.7 mg/dL (ref 1.6–2.3)

## 2023-08-05 LAB — LIPOPROTEIN A (LPA): Lipoprotein (a): 37.8 nmol/L

## 2023-08-05 MED ORDER — ROSUVASTATIN CALCIUM 10 MG PO TABS: 10.0000 mg | ORAL_TABLET | Freq: Every day | ORAL | 3 refills | Status: DC

## 2023-08-08 MED ORDER — PRAVASTATIN SODIUM 20 MG PO TABS: 20.0000 mg | ORAL_TABLET | ORAL | 3 refills | Status: AC

## 2023-08-12 DIAGNOSIS — Z1231 Encounter for screening mammogram for malignant neoplasm of breast: Secondary | ICD-10-CM | POA: Diagnosis not present

## 2023-08-12 DIAGNOSIS — I10 Essential (primary) hypertension: Secondary | ICD-10-CM | POA: Diagnosis not present

## 2023-08-12 DIAGNOSIS — I6789 Other cerebrovascular disease: Secondary | ICD-10-CM | POA: Diagnosis not present

## 2023-08-12 DIAGNOSIS — E66812 Obesity, class 2: Secondary | ICD-10-CM | POA: Diagnosis not present

## 2023-08-12 DIAGNOSIS — E785 Hyperlipidemia, unspecified: Secondary | ICD-10-CM | POA: Diagnosis not present

## 2023-08-12 DIAGNOSIS — R7301 Impaired fasting glucose: Secondary | ICD-10-CM | POA: Diagnosis not present

## 2023-08-14 DIAGNOSIS — F1120 Opioid dependence, uncomplicated: Secondary | ICD-10-CM | POA: Diagnosis not present

## 2023-08-14 DIAGNOSIS — M9902 Segmental and somatic dysfunction of thoracic region: Secondary | ICD-10-CM | POA: Diagnosis not present

## 2023-08-14 DIAGNOSIS — M9903 Segmental and somatic dysfunction of lumbar region: Secondary | ICD-10-CM | POA: Diagnosis not present

## 2023-08-14 DIAGNOSIS — M51362 Other intervertebral disc degeneration, lumbar region with discogenic back pain and lower extremity pain: Secondary | ICD-10-CM | POA: Diagnosis not present

## 2023-08-14 DIAGNOSIS — M9905 Segmental and somatic dysfunction of pelvic region: Secondary | ICD-10-CM | POA: Diagnosis not present

## 2023-08-14 DIAGNOSIS — Z79891 Long term (current) use of opiate analgesic: Secondary | ICD-10-CM | POA: Diagnosis not present

## 2023-08-23 DIAGNOSIS — M9903 Segmental and somatic dysfunction of lumbar region: Secondary | ICD-10-CM | POA: Diagnosis not present

## 2023-08-23 DIAGNOSIS — M5414 Radiculopathy, thoracic region: Secondary | ICD-10-CM | POA: Diagnosis not present

## 2023-08-23 DIAGNOSIS — M9902 Segmental and somatic dysfunction of thoracic region: Secondary | ICD-10-CM | POA: Diagnosis not present

## 2023-08-23 DIAGNOSIS — M5416 Radiculopathy, lumbar region: Secondary | ICD-10-CM | POA: Diagnosis not present

## 2023-08-28 DIAGNOSIS — M9902 Segmental and somatic dysfunction of thoracic region: Secondary | ICD-10-CM | POA: Diagnosis not present

## 2023-08-28 DIAGNOSIS — M5416 Radiculopathy, lumbar region: Secondary | ICD-10-CM | POA: Diagnosis not present

## 2023-08-28 DIAGNOSIS — M9903 Segmental and somatic dysfunction of lumbar region: Secondary | ICD-10-CM | POA: Diagnosis not present

## 2023-08-28 DIAGNOSIS — M5414 Radiculopathy, thoracic region: Secondary | ICD-10-CM | POA: Diagnosis not present

## 2023-08-29 ENCOUNTER — Ambulatory Visit: Attending: Cardiology

## 2023-08-29 DIAGNOSIS — I2510 Atherosclerotic heart disease of native coronary artery without angina pectoris: Secondary | ICD-10-CM | POA: Diagnosis not present

## 2023-08-29 DIAGNOSIS — I6521 Occlusion and stenosis of right carotid artery: Secondary | ICD-10-CM

## 2023-08-29 DIAGNOSIS — I779 Disorder of arteries and arterioles, unspecified: Secondary | ICD-10-CM | POA: Diagnosis not present

## 2023-08-30 DIAGNOSIS — M545 Low back pain, unspecified: Secondary | ICD-10-CM | POA: Diagnosis not present

## 2023-08-30 DIAGNOSIS — M797 Fibromyalgia: Secondary | ICD-10-CM | POA: Diagnosis not present

## 2023-08-30 DIAGNOSIS — M791 Myalgia, unspecified site: Secondary | ICD-10-CM | POA: Diagnosis not present

## 2023-08-30 LAB — LIPID PANEL
Chol/HDL Ratio: 3 ratio (ref 0.0–4.4)
Cholesterol, Total: 131 mg/dL (ref 100–199)
HDL: 44 mg/dL
LDL Chol Calc (NIH): 59 mg/dL (ref 0–99)
Triglycerides: 165 mg/dL — ABNORMAL HIGH (ref 0–149)
VLDL Cholesterol Cal: 28 mg/dL (ref 5–40)

## 2023-08-30 LAB — HEPATIC FUNCTION PANEL
ALT: 39 IU/L — ABNORMAL HIGH (ref 0–32)
AST: 35 IU/L (ref 0–40)
Albumin: 4 g/dL (ref 3.8–4.9)
Alkaline Phosphatase: 77 IU/L (ref 44–121)
Bilirubin Total: 0.2 mg/dL (ref 0.0–1.2)
Bilirubin, Direct: 0.1 mg/dL (ref 0.00–0.40)
Total Protein: 6.6 g/dL (ref 6.0–8.5)

## 2023-09-02 ENCOUNTER — Other Ambulatory Visit: Payer: Self-pay

## 2023-09-02 DIAGNOSIS — I779 Disorder of arteries and arterioles, unspecified: Secondary | ICD-10-CM

## 2023-09-04 DIAGNOSIS — M9903 Segmental and somatic dysfunction of lumbar region: Secondary | ICD-10-CM | POA: Diagnosis not present

## 2023-09-04 DIAGNOSIS — M5416 Radiculopathy, lumbar region: Secondary | ICD-10-CM | POA: Diagnosis not present

## 2023-09-04 DIAGNOSIS — M5414 Radiculopathy, thoracic region: Secondary | ICD-10-CM | POA: Diagnosis not present

## 2023-09-04 DIAGNOSIS — M9902 Segmental and somatic dysfunction of thoracic region: Secondary | ICD-10-CM | POA: Diagnosis not present

## 2023-09-10 ENCOUNTER — Other Ambulatory Visit (HOSPITAL_COMMUNITY): Payer: Self-pay

## 2023-09-10 ENCOUNTER — Telehealth: Payer: Self-pay | Admitting: Pharmacy Technician

## 2023-09-10 NOTE — Telephone Encounter (Signed)
 Pharmacy Patient Advocate Encounter   Received notification from Physician's Office that prior authorization for [facility] is required/requested.   Insurance verification completed.   The patient is insured through [employer] .   Per test claim: PA required; PA submitted to above mentioned insurance via CoverMyMeds Key/confirmation #/EOC [id number] Status is pending

## 2023-09-10 NOTE — Telephone Encounter (Signed)
 Pharmacy Patient Advocate Encounter  Received notification from [employer] that Prior Authorization for Lynn Rollins has been APPROVED from 05/01/23 to 04/29/24. Ran test claim, Copay is $0.00- one month. This test claim was processed through [facility]- copay amounts may vary at other pharmacies due to pharmacy/plan contracts, or as the patient moves through the different stages of their insurance plan.   PA #/Case ID/Reference #: [id number]

## 2023-09-10 NOTE — Telephone Encounter (Signed)
 Pt needs a PA for [employer].  Thank you

## 2023-09-11 ENCOUNTER — Other Ambulatory Visit (HOSPITAL_COMMUNITY): Payer: Self-pay

## 2023-09-11 DIAGNOSIS — M9903 Segmental and somatic dysfunction of lumbar region: Secondary | ICD-10-CM | POA: Diagnosis not present

## 2023-09-11 DIAGNOSIS — M5414 Radiculopathy, thoracic region: Secondary | ICD-10-CM | POA: Diagnosis not present

## 2023-09-11 DIAGNOSIS — M9902 Segmental and somatic dysfunction of thoracic region: Secondary | ICD-10-CM | POA: Diagnosis not present

## 2023-09-11 DIAGNOSIS — M5416 Radiculopathy, lumbar region: Secondary | ICD-10-CM | POA: Diagnosis not present

## 2023-09-11 MED ORDER — SEMAGLUTIDE-WEIGHT MANAGEMENT 1.7 MG/0.75ML ~~LOC~~ SOAJ
1.7000 mg | SUBCUTANEOUS | 0 refills | Status: AC
  Filled 2023-09-11: qty 3, 28d supply, fill #0

## 2023-09-11 MED ORDER — SEMAGLUTIDE-WEIGHT MANAGEMENT 0.25 MG/0.5ML ~~LOC~~ SOAJ
0.2500 mg | SUBCUTANEOUS | 0 refills | Status: DC
  Filled 2023-09-11: qty 2, 28d supply, fill #0

## 2023-09-11 MED ORDER — SEMAGLUTIDE-WEIGHT MANAGEMENT 0.5 MG/0.5ML ~~LOC~~ SOAJ
0.5000 mg | SUBCUTANEOUS | 0 refills | Status: AC
  Filled 2023-09-11: qty 2, 28d supply, fill #0

## 2023-09-11 MED ORDER — SEMAGLUTIDE-WEIGHT MANAGEMENT 1 MG/0.5ML ~~LOC~~ SOAJ
1.0000 mg | SUBCUTANEOUS | 0 refills | Status: AC
  Filled 2023-09-11: qty 2, 28d supply, fill #0

## 2023-09-11 MED ORDER — SEMAGLUTIDE-WEIGHT MANAGEMENT 0.25 MG/0.5ML ~~LOC~~ SOAJ: 0.2500 mg | SUBCUTANEOUS | 0 refills | Status: AC

## 2023-09-11 MED ORDER — SEMAGLUTIDE-WEIGHT MANAGEMENT 2.4 MG/0.75ML ~~LOC~~ SOAJ
2.4000 mg | SUBCUTANEOUS | 0 refills | Status: AC
  Filled 2023-09-11: qty 3, 28d supply, fill #0

## 2023-09-11 NOTE — Addendum Note (Signed)
 Addended by: Terrance Ferretti on: 09/11/2023 01:12 PM   Modules accepted: Orders

## 2023-09-18 DIAGNOSIS — M9903 Segmental and somatic dysfunction of lumbar region: Secondary | ICD-10-CM | POA: Diagnosis not present

## 2023-09-18 DIAGNOSIS — M9902 Segmental and somatic dysfunction of thoracic region: Secondary | ICD-10-CM | POA: Diagnosis not present

## 2023-09-18 DIAGNOSIS — M5416 Radiculopathy, lumbar region: Secondary | ICD-10-CM | POA: Diagnosis not present

## 2023-09-18 DIAGNOSIS — M5414 Radiculopathy, thoracic region: Secondary | ICD-10-CM | POA: Diagnosis not present

## 2023-09-25 DIAGNOSIS — M5414 Radiculopathy, thoracic region: Secondary | ICD-10-CM | POA: Diagnosis not present

## 2023-09-25 DIAGNOSIS — M9902 Segmental and somatic dysfunction of thoracic region: Secondary | ICD-10-CM | POA: Diagnosis not present

## 2023-09-25 DIAGNOSIS — M5416 Radiculopathy, lumbar region: Secondary | ICD-10-CM | POA: Diagnosis not present

## 2023-09-25 DIAGNOSIS — M9903 Segmental and somatic dysfunction of lumbar region: Secondary | ICD-10-CM | POA: Diagnosis not present

## 2023-09-30 ENCOUNTER — Telehealth: Payer: Self-pay | Admitting: Cardiology

## 2023-09-30 NOTE — Telephone Encounter (Signed)
 Pt c/o medication issue:  1. Name of Medication: Wegovy   2. How are you currently taking this medication (dosage and times per day)?   3. Are you having a reaction (difficulty breathing--STAT)?   4. What is your medication issue? Patient says it is time for her refill. She wants to know if Lynn Rollins needs or wants to increase her dose. Whatever is decided, she will need a refill called in please.

## 2023-09-30 NOTE — Telephone Encounter (Signed)
 Patient states that it is time for a re-fill on her Wegovy , but she is not sure if [facility] wants to increase the dose or just refill the medication at the current dosage. Please advise

## 2023-10-02 DIAGNOSIS — B9689 Other specified bacterial agents as the cause of diseases classified elsewhere: Secondary | ICD-10-CM | POA: Diagnosis not present

## 2023-10-02 DIAGNOSIS — M5414 Radiculopathy, thoracic region: Secondary | ICD-10-CM | POA: Diagnosis not present

## 2023-10-02 DIAGNOSIS — M9902 Segmental and somatic dysfunction of thoracic region: Secondary | ICD-10-CM | POA: Diagnosis not present

## 2023-10-02 DIAGNOSIS — M5416 Radiculopathy, lumbar region: Secondary | ICD-10-CM | POA: Diagnosis not present

## 2023-10-02 DIAGNOSIS — J208 Acute bronchitis due to other specified organisms: Secondary | ICD-10-CM | POA: Diagnosis not present

## 2023-10-02 DIAGNOSIS — M9903 Segmental and somatic dysfunction of lumbar region: Secondary | ICD-10-CM | POA: Diagnosis not present

## 2023-10-02 NOTE — Telephone Encounter (Signed)
 Called the patient and informed her that the pharmacy should have the next highest dose for her to pick - up, because it was all sent in at one time when the medication was prescribed. Patient also reported that she wasn't having any side effects or concerns regarding the medication. Patient had no further questions at this time.

## 2023-10-07 DIAGNOSIS — D126 Benign neoplasm of colon, unspecified: Secondary | ICD-10-CM | POA: Diagnosis not present

## 2023-10-07 DIAGNOSIS — R109 Unspecified abdominal pain: Secondary | ICD-10-CM | POA: Diagnosis not present

## 2023-10-07 DIAGNOSIS — K5900 Constipation, unspecified: Secondary | ICD-10-CM | POA: Diagnosis not present

## 2023-10-07 DIAGNOSIS — K219 Gastro-esophageal reflux disease without esophagitis: Secondary | ICD-10-CM | POA: Diagnosis not present

## 2023-10-07 DIAGNOSIS — R1310 Dysphagia, unspecified: Secondary | ICD-10-CM | POA: Diagnosis not present

## 2023-10-18 DIAGNOSIS — M5414 Radiculopathy, thoracic region: Secondary | ICD-10-CM | POA: Diagnosis not present

## 2023-10-18 DIAGNOSIS — M9902 Segmental and somatic dysfunction of thoracic region: Secondary | ICD-10-CM | POA: Diagnosis not present

## 2023-10-18 DIAGNOSIS — M5416 Radiculopathy, lumbar region: Secondary | ICD-10-CM | POA: Diagnosis not present

## 2023-10-18 DIAGNOSIS — M9903 Segmental and somatic dysfunction of lumbar region: Secondary | ICD-10-CM | POA: Diagnosis not present

## 2023-10-23 DIAGNOSIS — M545 Low back pain, unspecified: Secondary | ICD-10-CM | POA: Diagnosis not present

## 2023-10-23 DIAGNOSIS — M9903 Segmental and somatic dysfunction of lumbar region: Secondary | ICD-10-CM | POA: Diagnosis not present

## 2023-10-23 DIAGNOSIS — R52 Pain, unspecified: Secondary | ICD-10-CM | POA: Diagnosis not present

## 2023-10-23 DIAGNOSIS — M9902 Segmental and somatic dysfunction of thoracic region: Secondary | ICD-10-CM | POA: Diagnosis not present

## 2023-10-23 DIAGNOSIS — M5414 Radiculopathy, thoracic region: Secondary | ICD-10-CM | POA: Diagnosis not present

## 2023-11-06 DIAGNOSIS — M9903 Segmental and somatic dysfunction of lumbar region: Secondary | ICD-10-CM | POA: Diagnosis not present

## 2023-11-06 DIAGNOSIS — M5414 Radiculopathy, thoracic region: Secondary | ICD-10-CM | POA: Diagnosis not present

## 2023-11-06 DIAGNOSIS — M9902 Segmental and somatic dysfunction of thoracic region: Secondary | ICD-10-CM | POA: Diagnosis not present

## 2023-11-14 DIAGNOSIS — R7301 Impaired fasting glucose: Secondary | ICD-10-CM | POA: Diagnosis not present

## 2023-11-14 DIAGNOSIS — G8191 Hemiplegia, unspecified affecting right dominant side: Secondary | ICD-10-CM | POA: Diagnosis not present

## 2023-11-14 DIAGNOSIS — I10 Essential (primary) hypertension: Secondary | ICD-10-CM | POA: Diagnosis not present

## 2023-11-14 DIAGNOSIS — G629 Polyneuropathy, unspecified: Secondary | ICD-10-CM | POA: Diagnosis not present

## 2023-11-14 DIAGNOSIS — R600 Localized edema: Secondary | ICD-10-CM | POA: Diagnosis not present

## 2023-11-14 DIAGNOSIS — I6789 Other cerebrovascular disease: Secondary | ICD-10-CM | POA: Diagnosis not present

## 2023-11-14 DIAGNOSIS — J4489 Other specified chronic obstructive pulmonary disease: Secondary | ICD-10-CM | POA: Diagnosis not present

## 2023-11-14 DIAGNOSIS — L409 Psoriasis, unspecified: Secondary | ICD-10-CM | POA: Diagnosis not present

## 2023-11-14 DIAGNOSIS — E785 Hyperlipidemia, unspecified: Secondary | ICD-10-CM | POA: Diagnosis not present

## 2023-11-20 DIAGNOSIS — M5414 Radiculopathy, thoracic region: Secondary | ICD-10-CM | POA: Diagnosis not present

## 2023-11-20 DIAGNOSIS — M9902 Segmental and somatic dysfunction of thoracic region: Secondary | ICD-10-CM | POA: Diagnosis not present

## 2023-11-20 DIAGNOSIS — M9903 Segmental and somatic dysfunction of lumbar region: Secondary | ICD-10-CM | POA: Diagnosis not present

## 2023-12-03 DIAGNOSIS — Z79891 Long term (current) use of opiate analgesic: Secondary | ICD-10-CM | POA: Diagnosis not present

## 2023-12-03 DIAGNOSIS — F1120 Opioid dependence, uncomplicated: Secondary | ICD-10-CM | POA: Diagnosis not present

## 2023-12-04 DIAGNOSIS — M9903 Segmental and somatic dysfunction of lumbar region: Secondary | ICD-10-CM | POA: Diagnosis not present

## 2023-12-04 DIAGNOSIS — M9902 Segmental and somatic dysfunction of thoracic region: Secondary | ICD-10-CM | POA: Diagnosis not present

## 2023-12-04 DIAGNOSIS — M5414 Radiculopathy, thoracic region: Secondary | ICD-10-CM | POA: Diagnosis not present

## 2023-12-18 DIAGNOSIS — M9903 Segmental and somatic dysfunction of lumbar region: Secondary | ICD-10-CM | POA: Diagnosis not present

## 2023-12-18 DIAGNOSIS — M9904 Segmental and somatic dysfunction of sacral region: Secondary | ICD-10-CM | POA: Diagnosis not present

## 2023-12-18 DIAGNOSIS — M5417 Radiculopathy, lumbosacral region: Secondary | ICD-10-CM | POA: Diagnosis not present

## 2023-12-18 DIAGNOSIS — M5416 Radiculopathy, lumbar region: Secondary | ICD-10-CM | POA: Diagnosis not present

## 2023-12-25 DIAGNOSIS — R52 Pain, unspecified: Secondary | ICD-10-CM | POA: Diagnosis not present

## 2023-12-25 DIAGNOSIS — M545 Low back pain, unspecified: Secondary | ICD-10-CM | POA: Diagnosis not present

## 2024-01-01 DIAGNOSIS — M9903 Segmental and somatic dysfunction of lumbar region: Secondary | ICD-10-CM | POA: Diagnosis not present

## 2024-01-01 DIAGNOSIS — M5417 Radiculopathy, lumbosacral region: Secondary | ICD-10-CM | POA: Diagnosis not present

## 2024-01-01 DIAGNOSIS — M9904 Segmental and somatic dysfunction of sacral region: Secondary | ICD-10-CM | POA: Diagnosis not present

## 2024-01-01 DIAGNOSIS — M5416 Radiculopathy, lumbar region: Secondary | ICD-10-CM | POA: Diagnosis not present

## 2024-01-17 DIAGNOSIS — M5417 Radiculopathy, lumbosacral region: Secondary | ICD-10-CM | POA: Diagnosis not present

## 2024-01-17 DIAGNOSIS — M9903 Segmental and somatic dysfunction of lumbar region: Secondary | ICD-10-CM | POA: Diagnosis not present

## 2024-01-17 DIAGNOSIS — M9904 Segmental and somatic dysfunction of sacral region: Secondary | ICD-10-CM | POA: Diagnosis not present

## 2024-01-17 DIAGNOSIS — M5416 Radiculopathy, lumbar region: Secondary | ICD-10-CM | POA: Diagnosis not present

## 2024-01-29 DIAGNOSIS — M9904 Segmental and somatic dysfunction of sacral region: Secondary | ICD-10-CM | POA: Diagnosis not present

## 2024-01-29 DIAGNOSIS — M9903 Segmental and somatic dysfunction of lumbar region: Secondary | ICD-10-CM | POA: Diagnosis not present

## 2024-01-29 DIAGNOSIS — M5416 Radiculopathy, lumbar region: Secondary | ICD-10-CM | POA: Diagnosis not present

## 2024-01-29 DIAGNOSIS — M5417 Radiculopathy, lumbosacral region: Secondary | ICD-10-CM | POA: Diagnosis not present

## 2024-02-10 DIAGNOSIS — J4489 Other specified chronic obstructive pulmonary disease: Secondary | ICD-10-CM | POA: Diagnosis not present

## 2024-02-10 DIAGNOSIS — I10 Essential (primary) hypertension: Secondary | ICD-10-CM | POA: Diagnosis not present

## 2024-02-10 DIAGNOSIS — R7301 Impaired fasting glucose: Secondary | ICD-10-CM | POA: Diagnosis not present

## 2024-02-10 DIAGNOSIS — E785 Hyperlipidemia, unspecified: Secondary | ICD-10-CM | POA: Diagnosis not present

## 2024-02-10 DIAGNOSIS — I6789 Other cerebrovascular disease: Secondary | ICD-10-CM | POA: Diagnosis not present

## 2024-02-10 DIAGNOSIS — L409 Psoriasis, unspecified: Secondary | ICD-10-CM | POA: Diagnosis not present

## 2024-02-10 DIAGNOSIS — G4733 Obstructive sleep apnea (adult) (pediatric): Secondary | ICD-10-CM | POA: Diagnosis not present

## 2024-02-10 DIAGNOSIS — R600 Localized edema: Secondary | ICD-10-CM | POA: Diagnosis not present

## 2024-02-10 DIAGNOSIS — G8191 Hemiplegia, unspecified affecting right dominant side: Secondary | ICD-10-CM | POA: Diagnosis not present

## 2024-02-12 ENCOUNTER — Telehealth: Payer: Self-pay | Admitting: Allergy and Immunology

## 2024-02-12 ENCOUNTER — Ambulatory Visit: Admitting: Allergy and Immunology

## 2024-02-12 ENCOUNTER — Encounter: Payer: Self-pay | Admitting: Allergy and Immunology

## 2024-02-12 ENCOUNTER — Telehealth: Payer: Self-pay

## 2024-02-12 VITALS — BP 132/90 | HR 72 | Resp 16 | Ht <= 58 in | Wt 197.6 lb

## 2024-02-12 DIAGNOSIS — J383 Other diseases of vocal cords: Secondary | ICD-10-CM | POA: Diagnosis not present

## 2024-02-12 DIAGNOSIS — E66813 Obesity, class 3: Secondary | ICD-10-CM | POA: Diagnosis not present

## 2024-02-12 DIAGNOSIS — Z6841 Body Mass Index (BMI) 40.0 and over, adult: Secondary | ICD-10-CM

## 2024-02-12 DIAGNOSIS — J3089 Other allergic rhinitis: Secondary | ICD-10-CM

## 2024-02-12 DIAGNOSIS — J4550 Severe persistent asthma, uncomplicated: Secondary | ICD-10-CM

## 2024-02-12 DIAGNOSIS — G4733 Obstructive sleep apnea (adult) (pediatric): Secondary | ICD-10-CM | POA: Diagnosis not present

## 2024-02-12 MED ORDER — FLUTICASONE PROPIONATE 50 MCG/ACT NA SUSP: NASAL | 5 refills | Status: AC

## 2024-02-12 MED ORDER — AIRSUPRA 90-80 MCG/ACT IN AERO: 2.0000 | INHALATION_SPRAY | RESPIRATORY_TRACT | 1 refills | Status: DC | PRN

## 2024-02-12 MED ORDER — BREZTRI AEROSPHERE 160-9-4.8 MCG/ACT IN AERO: INHALATION_SPRAY | RESPIRATORY_TRACT | 5 refills | Status: AC

## 2024-02-12 MED ORDER — TEZEPELUMAB-EKKO 210 MG/1.91ML ~~LOC~~ SOSY
210.0000 mg | PREFILLED_SYRINGE | SUBCUTANEOUS | Status: AC
  Administered 2024-02-12: 210 mg via SUBCUTANEOUS

## 2024-02-12 NOTE — Progress Notes (Unsigned)
 Patient started sample of Tezspire. She has signed the Consent form and would like to have her husband learn how to self-inject at home.  I administered injection today. I reviewed signs and symptoms of allergic reactions and patient waited approximately 15 minutes post injection without any problems.

## 2024-02-12 NOTE — Telephone Encounter (Signed)
 Please let patient know that I forgot to give her the order form for her blood tests, however I did send the order electronically to [facility] and she can get those tests done at [facility] inside [address] on [address].  ( Monday through Friday, 8:00am-12:00pm and 1:00pm -5:00pm) .

## 2024-02-12 NOTE — Progress Notes (Unsigned)
 [place] - [place] - [place] - [place] - [place]   Dear Keren,  Thank you for referring Lynn Rollins to the [facility] [place]  on 02/12/2024.   Below is a summation of this patient's evaluation and recommendations.  Thank you for your referral. I will keep you informed about this patient's response to treatment.   If you have any questions please do not hesitate to contact me.   Sincerely,  Camellia DOROTHA Denis, MD Allergy / Immunology [place] [place]    ______________________________________________________________________    NEW PATIENT NOTE  Referring Provider: Keren Vicenta BRAVO, MD Primary Provider: Keren Vicenta BRAVO, MD Date of office visit: 02/12/2024    Subjective:   Chief Complaint:  Lynn Rollins (DOB: [birth year]) is a 58 y.o. female who presents to the clinic on 02/12/2024 with a chief complaint of Asthma .     HPI: Lynn Rollins presents to this clinic in evaluation of asthma.  She states that over the course of the past 6 years if not greater she has been having problems with asthma.  Her asthma is occasionally manifested as coughing wheezing but the thing that bothers her the most is that she gets completely out of breath.  If she exerts herself such as walking in the house or talking long she gets out of breath.  When she uses a short acting bronchodilator she is not really sure that this helps this issue.  Her recovery time is about 10 minutes.  She has seen pulmonology who was treat her with a multitude of anti-inflammatory agents and currently she is using a trilogy twice a day.  She is not sure that this helps her very much.  She has also been given montelukast.  She is not sure that this has helped her very much.  She is not sure that anything has helped her very much regarding her very severe dyspnea on exertion.  She has had evaluation with cardiology and has had an  echocardiogram which did not identify any significant abnormality and she has had a coronary CT morphology study which did not identify any significant abnormality.  She has not had a exercise test.  She has a history of sleep apnea that is untreated currently.  She has some slight nasal congestion which appears to be a chronic issue.  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 pylori infection    Hearing loss of left ear 09/09/2017   Hemorrhoid    Hiatal hernia with GERD    History of cardiomyopathy    History of loop recorder 08/25/2020   History of stroke    Hyperlipemia    Hypoglycemia    IBS (irritable bowel syndrome)  Migraine    Morbid obesity (HCC)    OSA on CPAP    Scalp psoriasis    Sensorineural hearing loss (SNHL) of left ear with unrestricted hearing of right ear 09/26/2017   Formatting of this note might be different from the original. Onset 07/2017 with CVA.  MRI brain just showed old infarcts. Last Assessment & Plan: Formatting of this note might be different from the original. Follow-up sudden left sensorineural hearing loss. Onset back in April.  She was treated at [facility] for stroke.  She was given steroids.  Unfortunately hearing is still very bad.  Some unste   Stroke [facility])    Vertigo    Vitamin D deficiency      Past Surgical History:  Procedure Laterality Date   ABDOMINAL HYSTERECTOMY     CHOLECYSTECTOMY     COLONOSCOPY  04/28/2015   Mild sigmoid diverticulosis. Otherwise normal colonoscopy   ESOPHAGOGASTRODUODENOSCOPY  09/11/2013   Mild gastritis. Otherwise normal esophagogastroduodenoscopy   LOOP RECORDER INSERTION N/A 10/01/2016   Procedure: Loop Recorder Insertion;  Surgeon: Fernande Elspeth BROCKS, MD;  Location: [facility] INVASIVE CV LAB;  Service: Cardiovascular;  Laterality: N/A;   LOOP RECORDER REMOVAL N/A 09/07/2020   Procedure: LOOP RECORDER REMOVAL;  Surgeon: Fernande Elspeth BROCKS, MD;  Location: [facility] INVASIVE CV LAB;  Service: Cardiovascular;  Laterality: N/A;   OTHER SURGICAL HISTORY     screw in head. Bone anch [place] hearing aid   TEE WITHOUT CARDIOVERSION N/A 10/01/2016   Procedure: TRANSESOPHAGEAL ECHOCARDIOGRAM (TEE);  Surgeon: Alveta Aleene PARAS, MD;  Location: [facility] ENDOSCOPY;  Service: Cardiovascular;  Laterality: N/A;   TUBAL LIGATION      Allergies as of 02/12/2024       Reactions   Celexa [citalopram Hydrobromide] Other (See Comments)   Made me cry and made me worse   Tape Rash, Other (See Comments)   Patient has Fibromyalgia and cannot tolerate medical tape and it's removal   Broccoli [brassica Oleracea]    Statins Other (See Comments)   Muscles cramp        Medication List    albuterol  108 (90 Base) MCG/ACT inhaler Commonly known as: VENTOLIN  HFA Inhale 2 puffs into the lungs every 4 (four) hours as needed for wheezing.   clopidogrel  75 MG tablet Commonly known as: PLAVIX  Take 75 mg by mouth daily.   Dexilant 60 MG capsule Generic drug: dexlansoprazole Take 60 mg by mouth daily.   dicyclomine  20 MG tablet Commonly known as: BENTYL  Take 20 mg by mouth 4 (four) times daily.   famotidine  40 MG tablet Commonly known as: PEPCID  Take 1 tablet by mouth daily.   gabapentin 600 MG tablet Commonly known as: NEURONTIN Take 600 mg by mouth 3 (three) times daily.    ipratropium-albuterol  0.5-2.5 (3) MG/3ML Soln Commonly known as: DUONEB Inhale 3 mLs into the lungs every 6 (six) hours as needed (for shortness of breath or wheezing).   labetalol 100 MG tablet Commonly known as: NORMODYNE Take 100 mg by mouth 2 (two) times daily.   lisinopril  40 MG tablet Commonly known as: ZESTRIL  Take 40 mg by mouth daily.   meclizine  25 MG tablet Commonly known as: ANTIVERT  Take 25 mg by mouth 3 (three) times daily as needed for dizziness.   montelukast 10 MG tablet Commonly known as: SINGULAIR Take 10 mg by mouth daily.   ondansetron  4 MG disintegrating tablet Commonly known as: ZOFRAN -ODT Take 4 mg by mouth every 8 (eight) hours as needed.   OxyCONTIN   10 MG 12 hr tablet Generic drug: oxyCODONE  Take 10 mg by mouth QID.   pravastatin  20 MG tablet Commonly known as: PRAVACHOL  Take 1 tablet (20 mg total) by mouth every other day.   temazepam  30 MG capsule Commonly known as: RESTORIL  Take 30 mg by mouth at bedtime as needed for sleep.   Trelegy Ellipta 200-62.5-25 MCG/ACT Aepb Generic drug: Fluticasone -Umeclidin-Vilant Inhale 1 puff into the lungs daily.   triamcinolone  cream 0.1 % Commonly known as: KENALOG  Apply 1 Application topically 2 (two) times daily.   Vitamin D (Ergocalciferol) 1.25 MG (50000 UNIT) Caps capsule Commonly known as: DRISDOL Take 50,000 Units by mouth every Saturday.    Review of systems negative except as noted in HPI / PMHx or noted below:  Review of Systems  Constitutional: Negative.   HENT: Negative.    Eyes: Negative.   Respiratory: Negative.    Cardiovascular: Negative.   Gastrointestinal: Negative.   Genitourinary: Negative.   Musculoskeletal: Negative.   Skin: Negative.   Neurological: Negative.   Endo/Heme/Allergies: Negative.   Psychiatric/Behavioral: Negative.      Family History  Problem Relation Age of Onset   Stroke Mother    Heart attack Mother    Hypertension Mother    Parkinson's disease  Father    Heart disease Father    Stroke Father    Other Father        esophageal stricture   Heart attack Father    Hypertension Father    Alcohol abuse Father    Thyroid  disease Sister    Multiple sclerosis Brother    Diabetes Maternal Grandmother    Stroke Maternal Grandmother    Heart attack Maternal Grandmother    Hypertension Maternal Grandmother    Heart disease Maternal Grandmother    Stroke Maternal Grandfather    Hypertension Maternal Grandfather    Heart disease Maternal Grandfather    Asthma Paternal Grandmother    Diabetes Paternal Grandmother    Stroke Paternal Grandmother    Heart attack Paternal Grandmother    Hypertension Paternal Grandmother    Heart disease Paternal Grandmother    Stroke Paternal Grandfather    Hypertension Paternal Grandfather    Heart disease Paternal Grandfather    Esophageal cancer Neg Hx     Social History   Socioeconomic History   Marital status: Married    Spouse name: Not on file   Number of children: Not on file   Years of education: Not on file   Highest education level: Not on file  Occupational History   Not on file  Tobacco Use   Smoking status: Never   Smokeless tobacco: Never  Vaping Use   Vaping status: Never Used  Substance and Sexual Activity   Alcohol use: No   Drug use: No   Sexual activity: Not on file  Other Topics Concern   Not on file  Social History Narrative   Lives at home with Dale, husband.  She is disabled.  Education graduate.  2 children.  Caffeine 2 drinks per day.  (most).   Social Drivers of Corporate investment banker Strain: Not on file  Food Insecurity: Not on file  Transportation Needs: Not on file  Physical Activity: Not on file  Stress: Not on file  Social Connections: Not on file  Intimate Partner Violence: Not on file    Environmental and Social history  Lives in a house with a dry environment, cat looking inside the household, carpet in the bedroom, plastic  on the bed, no  plastic on the pillow, no smoking ongoing with inside household.  Objective:   Vitals:   02/12/24 1419 02/12/24 1454  BP: (!) 132/90   Pulse: 72   Resp: 16   SpO2: 93% 94%   Height: 4' 9.5 (146.1 cm) Weight: 197 lb 9.6 oz (89.6 kg)  Physical Exam Constitutional:      Appearance: She is not diaphoretic.  HENT:     Head: Normocephalic.     Right Ear: Tympanic membrane, ear canal and external ear normal.     Left Ear: Tympanic membrane, ear canal and external ear normal.     Nose: Nose normal. No mucosal edema or rhinorrhea.     Mouth/Throat:     Pharynx: Uvula midline. No oropharyngeal exudate.  Eyes:     Conjunctiva/sclera: Conjunctivae normal.  Neck:     Thyroid : No thyromegaly.     Trachea: Trachea normal. No tracheal tenderness or tracheal deviation.  Cardiovascular:     Rate and Rhythm: Normal rate and regular rhythm.     Heart sounds: Normal heart sounds, S1 normal and S2 normal. No murmur heard. Pulmonary:     Effort: No respiratory distress.     Breath sounds: Normal breath sounds. No stridor. No wheezing or rales.  Lymphadenopathy:     Head:     Right side of head: No tonsillar adenopathy.     Left side of head: No tonsillar adenopathy.     Cervical: No cervical adenopathy.  Skin:    Findings: No erythema or rash.     Nails: There is no clubbing.  Neurological:     Mental Status: She is alert.     Diagnostics: Allergy skin tests were performed.   Spirometry was performed and demonstrated an FEV1 of 0.79 @ 40 % of predicted. FEV1/FVC = 0.63  Results of blood tests obtained 29 Aug 2023 identifies creatinine 0.68 mg/DL, AST 64L/O, ALT 60L/O.  Results of blood test obtained for April 2025 identifies WBC 9.2, absolute eosinophil 300, absolute lymphocyte 4100, hemoglobin 14.0, platelet 305  Results of blood tests obtained 10 August 2017 identifies negative ANCA screen  Results of a echocardiogram obtained 06 April 2022 identified the following:   1. TDS  and limited study. Mild ASH noted, no LVOT gradient. Left  ventricular ejection fraction, by estimation, is 60 to 65%. The left  ventricle has normal function. The left ventricle has no regional wall  motion abnormalities. Left ventricular diastolic   parameters are consistent with Grade I diastolic dysfunction (impaired  relaxation).   2. Right ventricular systolic function is normal. The right ventricular  size is normal.   3. The mitral valve is normal in structure. No evidence of mitral valve  regurgitation. No evidence of mitral stenosis.   4. The aortic valve is normal in structure. Aortic valve regurgitation is  not visualized. No aortic stenosis is present.   5. The inferior vena cava is normal in size with greater than 50%  respiratory variability, suggesting right atrial pressure of 3 mmHg.   Results of a coronary morphology CT chest obtained 16 July 2023 identifies the following:  No central pulmonary embolus. Small hiatal hernia. No visualized focal consolidation, pleural effusion, or pneumothorax. Hepatic steatosis. No acute osseous abnormality.   Assessment and Plan:    1. Not well controlled severe persistent asthma (HCC)   2. Perennial allergic rhinitis     Patient Instructions   1. Blood for Area 2 aeroallergen profile, IgA/G/M. A1AT  swab test  2. Treat and prevent inflammation of airway:   A. Breztri - 2 inhalations 2 times per day w/ spacer (empty lungs)  B. Fluticasone  - 1 spray each nostril 2 times per day  C. Tezepelumab injection today and every 4 weeks  D. Stop montelukast (this is not helping)  3. Treat and prevent reflux:   A. Dexilant 60 mg - 1 tablet in AM  B. Famotidine  40 mg - 1 tablet in PM  C. Minimize any caffeine / chocolate consumption  4. If needed:   A. Airsupra - 2 inhalations every 4-6 hours  5. Start daily progressive exercise program starting with 1 min.   6. Return to clinic in 4 weeks or earlier if problem   Camellia DOROTHA Denis, MD Allergy / Immunology [place] [facility] [place] 

## 2024-02-12 NOTE — Patient Instructions (Addendum)
  1. Blood for Area 2 aeroallergen profile, IgA/G/M. A1AT swab test  2. Treat and prevent inflammation of airway:   A. Breztri - 2 inhalations 2 times per day w/ spacer (empty lungs)  B. Fluticasone  - 1 spray each nostril 2 times per day  C. Tezepelumab injection today and every 4 weeks  D. Stop montelukast (this is not helping)  3. Treat and prevent reflux:   A. Dexilant 60 mg - 1 tablet in AM  B. Famotidine  40 mg - 1 tablet in PM  C. Minimize any caffeine / chocolate consumption  4. If needed:   A. Airsupra - 2 inhalations every 4-6 hours  5. Start daily progressive exercise program starting with 1 min.   6. Return to clinic in 4 weeks or earlier if problem

## 2024-02-12 NOTE — Telephone Encounter (Signed)
 Patient is wanting to know if Dr. Kozlow would be able to prescribe her sleep apnea supplies. She is wanting a new mask but does not want to see her current Pulmonologist.

## 2024-02-13 ENCOUNTER — Encounter: Payer: Self-pay | Admitting: Allergy and Immunology

## 2024-02-13 ENCOUNTER — Telehealth: Payer: Self-pay | Admitting: *Deleted

## 2024-02-13 ENCOUNTER — Other Ambulatory Visit (HOSPITAL_COMMUNITY): Payer: Self-pay

## 2024-02-13 MED ORDER — TEZSPIRE 210 MG/1.91ML ~~LOC~~ SOAJ
210.0000 mg | SUBCUTANEOUS | 11 refills | Status: AC
  Filled 2024-02-17: qty 1.91, 28d supply, fill #0
  Filled 2024-03-30: qty 1.91, 28d supply, fill #1
  Filled 2024-04-21: qty 1.91, 28d supply, fill #2
  Filled 2024-05-19: qty 1.91, 28d supply, fill #3

## 2024-02-13 NOTE — Telephone Encounter (Signed)
-----   Message from [facility] sent at 02/12/2024  4:40 PM EDT ----- Patient started sample of Tezspire during office visit today.  I administered injection (syringe) , as she is not a fan of needles, but she would like to have husband do future injections at home.  She has signed the consent form.  Please help. Thank you, Chiquita SAUNDERS, CMA

## 2024-02-13 NOTE — Telephone Encounter (Signed)
 Sent My-Chart message

## 2024-02-14 ENCOUNTER — Other Ambulatory Visit: Payer: Self-pay

## 2024-02-14 DIAGNOSIS — J3089 Other allergic rhinitis: Secondary | ICD-10-CM | POA: Diagnosis not present

## 2024-02-14 DIAGNOSIS — J4550 Severe persistent asthma, uncomplicated: Secondary | ICD-10-CM | POA: Diagnosis not present

## 2024-02-14 NOTE — Telephone Encounter (Signed)
 Called patient and advised approval and submit to [employer] or Tezspire. Instructed on delivery, storage and dosing for same.

## 2024-02-17 ENCOUNTER — Other Ambulatory Visit: Payer: Self-pay

## 2024-02-17 ENCOUNTER — Ambulatory Visit: Payer: Self-pay | Admitting: Allergy and Immunology

## 2024-02-17 LAB — ALLERGENS W/TOTAL IGE AREA 2
Alternaria Alternata IgE: <0.1 kU/L
Aspergillus Fumigatus IgE: <0.1 kU/L
Bermuda Grass IgE: <0.1 kU/L
Cat Dander IgE: <0.1 kU/L
Cedar, Mountain IgE: <0.1 kU/L
Cladosporium Herbarum IgE: <0.1 kU/L
Cockroach, German IgE: <0.1 kU/L
Common Silver Birch IgE: <0.1 kU/L
Cottonwood IgE: 0.16 kU/L — AB
D Farinae IgE: <0.1 kU/L
D Pteronyssinus IgE: <0.1 kU/L
Dog Dander IgE: <0.1 kU/L
Elm, American IgE: <0.1 kU/L
IgE (Immunoglobulin E), Serum: 30 [IU]/mL (ref 6–495)
Johnson Grass IgE: <0.1 kU/L
Maple/Box Elder IgE: 0.11 kU/L — AB
Mouse Urine IgE: <0.1 kU/L
Oak, White IgE: <0.1 kU/L
Pecan, Hickory IgE: <0.1 kU/L
Penicillium Chrysogen IgE: <0.1 kU/L
Pigweed, Rough IgE: <0.1 kU/L
Ragweed, Short IgE: <0.1 kU/L
Sheep Sorrel IgE Qn: <0.1 kU/L
Timothy Grass IgE: <0.1 kU/L
White Mulberry IgE: <0.1 kU/L

## 2024-02-17 LAB — IGG, IGA, IGM
IgA/Immunoglobulin A, Serum: 311 mg/dL (ref 87–352)
IgG (Immunoglobin G), Serum: 800 mg/dL (ref 586–1602)
IgM (Immunoglobulin M), Srm: 76 mg/dL (ref 26–217)

## 2024-02-17 NOTE — Progress Notes (Signed)
 Specialty Pharmacy Initial Fill Coordination Note  Lynn Rollins is a 58 y.o. female contacted today regarding initial fill of specialty medication(s) Tezepelumab-ekko Phill)   Patient requested Delivery   Delivery date: 03/03/24   Verified address: [address]   [facility] [place] [address]   Medication will be filled on 11/03.   Patient is aware of $0.00 copayment.

## 2024-02-17 NOTE — Progress Notes (Signed)
 Specialty Pharmacy Initiation Note   Lynn Rollins is a 58 y.o. female who will be followed by the specialty pharmacy service for RxSp Asthma/COPD    Review of administration, indication, effectiveness, safety, potential side effects, storage/disposable, and missed dose instructions occurred today for patient's specialty medication(s) Tezepelumab-ekko Phill)     Patient/Caregiver did not have any additional questions or concerns.   Patient's therapy is appropriate to: Continue (Patient received first dose in provider office from sample on 10/15)    Goals Addressed             This Visit's Progress    Reduce disease symptoms including coughing and shortness of breath       Patient is initiating therapy. Patient will maintain adherence and avoid flare triggers         [name] M Chistian Kasler Specialty Pharmacist

## 2024-02-24 DIAGNOSIS — F1120 Opioid dependence, uncomplicated: Secondary | ICD-10-CM | POA: Diagnosis not present

## 2024-02-24 DIAGNOSIS — R52 Pain, unspecified: Secondary | ICD-10-CM | POA: Diagnosis not present

## 2024-02-24 DIAGNOSIS — Z79891 Long term (current) use of opiate analgesic: Secondary | ICD-10-CM | POA: Diagnosis not present

## 2024-02-24 DIAGNOSIS — M545 Low back pain, unspecified: Secondary | ICD-10-CM | POA: Diagnosis not present

## 2024-03-02 ENCOUNTER — Other Ambulatory Visit: Payer: Self-pay

## 2024-03-04 ENCOUNTER — Other Ambulatory Visit: Payer: Self-pay | Admitting: Medical Genetics

## 2024-03-04 ENCOUNTER — Telehealth: Payer: Self-pay

## 2024-03-04 NOTE — Telephone Encounter (Signed)
 Called and spoke to patient.  She actually meant an air purifier in her previous message, not a humidifier/dehumidifier. She does currently have a bubble reservoir.  I am planning to contact [employer] again tomorrow to see if they can send over the order form for her CPAP supplies.

## 2024-03-04 NOTE — Telephone Encounter (Signed)
 Informed patient that her Alpha-1 Antitrypsin test came back normal.

## 2024-03-05 ENCOUNTER — Ambulatory Visit: Attending: Cardiology

## 2024-03-05 DIAGNOSIS — I6523 Occlusion and stenosis of bilateral carotid arteries: Secondary | ICD-10-CM | POA: Diagnosis not present

## 2024-03-05 DIAGNOSIS — I779 Disorder of arteries and arterioles, unspecified: Secondary | ICD-10-CM

## 2024-03-06 ENCOUNTER — Ambulatory Visit: Payer: Self-pay | Admitting: Cardiology

## 2024-03-06 NOTE — Telephone Encounter (Signed)
 Spoke with [facility] receptionist and requested pt's most recent labwork be faxed to [phone].  She  states she will fax as soon as possible.

## 2024-03-09 ENCOUNTER — Other Ambulatory Visit: Payer: Self-pay

## 2024-03-09 DIAGNOSIS — E782 Mixed hyperlipidemia: Secondary | ICD-10-CM

## 2024-03-10 ENCOUNTER — Encounter: Payer: Self-pay | Admitting: Allergy and Immunology

## 2024-03-11 ENCOUNTER — Ambulatory Visit: Admitting: Allergy and Immunology

## 2024-03-11 ENCOUNTER — Encounter: Payer: Self-pay | Admitting: Allergy and Immunology

## 2024-03-11 VITALS — BP 138/88 | HR 62 | Resp 18

## 2024-03-11 DIAGNOSIS — F410 Panic disorder [episodic paroxysmal anxiety] without agoraphobia: Secondary | ICD-10-CM

## 2024-03-11 DIAGNOSIS — J3089 Other allergic rhinitis: Secondary | ICD-10-CM

## 2024-03-11 DIAGNOSIS — J383 Other diseases of vocal cords: Secondary | ICD-10-CM | POA: Diagnosis not present

## 2024-03-11 DIAGNOSIS — J4550 Severe persistent asthma, uncomplicated: Secondary | ICD-10-CM

## 2024-03-11 NOTE — Patient Instructions (Addendum)
  1. Allergen avoidance measures - trees  2. Treat and prevent inflammation of airway:   A. Breztri - 2 inhalations 2 times per day w/ spacer (empty lungs)  B. Fluticasone  - 1 spray each nostril 2 times per day  C. Tezepelumab injection every 4 weeks  D. Daily progressive exercise program  3. Treat and prevent reflux:   A. Dexilant 60 mg - 1 tablet in AM  B. Famotidine  40 mg - 1 tablet in PM  C. Minimize any caffeine / chocolate consumption  4. Treat and prevent panic attacks:   A. Decrease caffeine / chocolate consumption  B. Ask Primary MD about serotonergic medication for anxiety/panic attack  5. If needed:   A. Airsupra - 2 inhalations every 4-6 hours  6. Return to clinic in 12 weeks or earlier if problem  7. Influenza = Tamiflu. Covid = Paxlovid

## 2024-03-11 NOTE — Progress Notes (Signed)
 [place] - [place] - [place] - [place] - [place]   Follow-up Note  Referring Provider: Keren Vicenta BRAVO, MD Primary Provider: Keren Vicenta BRAVO, MD Date of Office Visit: 03/11/2024  Subjective:   Lynn Rollins (DOB: January 20, 1966) is a 58 y.o. female who [facility] in re-evaluation of the following:  HPI: [name] returns to this clinic in evaluation of asthma, allergic rhinitis, vocal cord dysfunction, obstructive sleep apnea, obesity.  I last saw her in this clinic 11 March 2024.  She is doing much better at this point in time and has more air and can work around the house without much problem and does not need to use a short acting bronchodilator.  She has already used 1 injection of tezepelumab and is scheduled for another injection today.  She continues on Breztri.  She still has very bad reflux even on her current plan.  She has to be very careful about what she consumes or she develops regurgitation and heartburn in her chest.  She has panic attacks.  She can recognize when she is having a panic attack but this is a pretty significant issue for her and it appears to be quite common.  She apparently has no medications for her panic attack.  There is some hesitation apparently about giving her an agent for this issue because she is on daily narcotics.  She was on Valium  in the past which definitely helped her panic attack and anxiety but she is no longer on that agent.  She has restarted her treatment for sleep apnea.  She has not restarted Wegovy  because of the significant constipation issues she had in the past.  Allergies as of 03/11/2024       Reactions   Celexa [citalopram Hydrobromide] Other (See Comments)   Made me cry and made me worse   Tape Rash, Other (See Comments)   Patient has Fibromyalgia and cannot tolerate medical tape and it's removal   Broccoli [brassica Oleracea]    Statins Other (See Comments)    Muscles cramp        Medication List    Airsupra 90-80 MCG/ACT Aero Generic drug: Albuterol -Budesonide Inhale 2 puffs into the lungs as needed (every 4 to 6 hours for cough, wheeze, shortness of breath. Rinse, gargle, and spit after use).   Breztri Aerosphere 160-9-4.8 MCG/ACT Aero inhaler Generic drug: budesonide-glycopyrrolate-formoterol Inhale two puffs with spacer twice daily to prevent cough or wheeze. Rinse, gargle, and spit after use.   clopidogrel  75 MG tablet Commonly known as: PLAVIX  Take 75 mg by mouth daily.   Dexilant 60 MG capsule Generic drug: dexlansoprazole Take 60 mg by mouth daily.   dicyclomine  20 MG tablet Commonly known as: BENTYL  Take 20 mg by mouth 4 (four) times daily.   famotidine  40 MG tablet Commonly known as: PEPCID  Take 1 tablet by mouth daily.   fluticasone  50 MCG/ACT nasal spray Commonly known as: FLONASE Use one spray in each nostril twice daily as directed.   gabapentin 600 MG tablet Commonly known as: NEURONTIN Take 600 mg by mouth 3 (three) times daily.   ipratropium-albuterol  0.5-2.5 (3) MG/3ML Soln Commonly known as: DUONEB Inhale 3 mLs into the lungs every 6 (six) hours as needed (for shortness of breath or wheezing).   labetalol 100 MG tablet Commonly known as: NORMODYNE Take 100 mg by mouth 2 (two) times daily.   lisinopril  40 MG tablet Commonly known as: ZESTRIL  Take 40 mg by mouth  daily.   meclizine  25 MG tablet Commonly known as: ANTIVERT  Take 25 mg by mouth 3 (three) times daily as needed for dizziness.   ondansetron  4 MG disintegrating tablet Commonly known as: ZOFRAN -ODT Take 4 mg by mouth every 8 (eight) hours as needed.   OxyCONTIN  10 MG 12 hr tablet Generic drug: oxyCODONE  Take 10 mg by mouth QID.   pravastatin  20 MG tablet Commonly known as: PRAVACHOL  Take 1 tablet (20 mg total) by mouth every other day.   temazepam  30 MG capsule Commonly known as: RESTORIL  Take 30 mg by mouth at bedtime as needed  for sleep.   Tezspire 210 MG/1. Soaj Generic drug: Tezepelumab-ekko Inject 210 mg into the skin every 28 (twenty-eight) days.   triamcinolone  cream 0.1 % Commonly known as: KENALOG  Apply 1 Application topically 2 (two) times daily.   Vitamin D (Ergocalciferol) 1.25 MG (50000 UNIT) Caps capsule Commonly known as: DRISDOL Take 50,000 Units by mouth every Saturday.    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 pylori infection    Hearing loss of left ear 09/09/2017   Hemorrhoid    Hiatal hernia with GERD    History of cardiomyopathy    History of loop recorder 08/25/2020   History of stroke    Hyperlipemia    Hypoglycemia    IBS (irritable bowel syndrome)    Migraine    Morbid obesity (HCC)    OSA on CPAP    Scalp psoriasis    Sensorineural hearing loss (SNHL) of left ear with unrestricted hearing of right ear 09/26/2017   Formatting of this note might be different from the original. Onset 07/2017 with CVA.  MRI brain just showed old infarcts. Last Assessment & Plan: Formatting of this note might be different from the original. Follow-up sudden left sensorineural hearing loss. Onset back in April.  She was treated at [facility] for stroke.  She was given steroids.  Unfortunately hearing is still very bad.  Some unste   Stroke  [facility])    Vertigo    Vitamin D deficiency     Past Surgical History:  Procedure Laterality Date   ABDOMINAL HYSTERECTOMY     CHOLECYSTECTOMY     COLONOSCOPY  04/28/2015   Mild sigmoid diverticulosis. Otherwise normal colonoscopy   ESOPHAGOGASTRODUODENOSCOPY  09/11/2013   Mild gastritis. Otherwise normal esophagogastroduodenoscopy   LOOP RECORDER INSERTION N/A 10/01/2016   Procedure: Loop Recorder Insertion;  Surgeon: Fernande Elspeth BROCKS, MD;  Location: [facility] INVASIVE CV LAB;  Service: Cardiovascular;  Laterality: N/A;   LOOP RECORDER REMOVAL N/A 09/07/2020   Procedure: LOOP RECORDER REMOVAL;  Surgeon: Fernande Elspeth BROCKS, MD;  Location: [facility] INVASIVE CV LAB;  Service: Cardiovascular;  Laterality: N/A;   OTHER SURGICAL HISTORY  screw in head. Bone anch B[place] hearing aid   TEE WITHOUT CARDIOVERSION N/A 10/01/2016   Procedure: TRANSESOPHAGEAL ECHOCARDIOGRAM (TEE);  Surgeon: Alveta Aleene PARAS, MD;  Location: [facility] ENDOSCOPY;  Service: Cardiovascular;  Laterality: N/A;   TUBAL LIGATION      Review of systems negative except as noted in HPI / PMHx or noted below:  Review of Systems  Constitutional: Negative.   HENT: Negative.    Eyes: Negative.   Respiratory: Negative.    Cardiovascular: Negative.   Gastrointestinal: Negative.   Genitourinary: Negative.   Musculoskeletal: Negative.   Skin: Negative.   Neurological: Negative.   Endo/Heme/Allergies: Negative.   Psychiatric/Behavioral: Negative.       Objective:   Vitals:   03/11/24 1142  BP: 138/88  Pulse: 62  Resp: 18  SpO2: 97%          Physical Exam Constitutional:      Appearance: She is not diaphoretic.  HENT:     Head: Normocephalic.     Right Ear: Tympanic membrane, ear canal and external ear normal.     Left Ear: Tympanic membrane, ear canal and external ear normal.     Nose: Nose normal. No mucosal edema or rhinorrhea.     Mouth/Throat:     Pharynx: Uvula midline. No oropharyngeal exudate.  Eyes:     Conjunctiva/sclera:  Conjunctivae normal.  Neck:     Thyroid : No thyromegaly.     Trachea: Trachea normal. No tracheal tenderness or tracheal deviation.  Cardiovascular:     Rate and Rhythm: Normal rate and regular rhythm.     Heart sounds: Normal heart sounds, S1 normal and S2 normal. No murmur heard. Pulmonary:     Effort: No respiratory distress.     Breath sounds: Normal breath sounds. No stridor. No wheezing or rales.  Lymphadenopathy:     Head:     Right side of head: No tonsillar adenopathy.     Left side of head: No tonsillar adenopathy.     Cervical: No cervical adenopathy.  Skin:    Findings: No erythema or rash.     Nails: There is no clubbing.  Neurological:     Mental Status: She is alert.     Diagnostics: Spirometry was performed and demonstrated an FEV1 of 1.57 at 77 % of predicted.  Her previous FEV1 was 0.79.  Results of blood tests obtained 14 February 2024 identifies IgE 30 KU/L, IgE antibodies directed against tree pollen on an area two aeroallergen IgE profile, IgG 800 mg/DL, IgM 76 MGs/DL, IgA 688 MGs/DL.  Assessment and Plan:   1. Asthma, severe persistent, well-controlled (HCC)   2. Perennial allergic rhinitis   3. Vocal cord dysfunction   4. Panic anxiety syndrome    1. Allergen avoidance measures - trees  2. Treat and prevent inflammation of airway:   A. Breztri - 2 inhalations 2 times per day w/ spacer (empty lungs)  B. Fluticasone  - 1 spray each nostril 2 times per day  C. Tezepelumab injection every 4 weeks  D. Daily progressive exercise program  3. Treat and prevent reflux:   A. Dexilant 60 mg - 1 tablet in AM  B. Famotidine  40 mg - 1 tablet in PM  C. Minimize any caffeine / chocolate consumption  4. Treat and prevent panic attacks:   A. Decrease caffeine / chocolate consumption  B. Ask Primary MD about serotonergic medication for anxiety/panic attack  5. If needed:   A. Airsupra - 2 inhalations every 4-6 hours  6. Return to clinic in 12 weeks or  earlier if problem  7. Influenza = Tamiflu. Covid = Paxlovid  Dorinda is doing much better at this point in time and this is probably a reflection of her using anti-inflammatory agents on a consistent basis including the use of anti-TSLP antibody.  Will continue to have her use this plan and I have encouraged her to continue to use some form of exercise in a progressive manner as she moves forward.  Her reflux is okay on her current plan and would be better if she did not consume any caffeine or chocolate.  Likewise I think that her panic attack and anxiety disorder would be better if she did not consume any caffeine or chocolate and she probably needs some form of serotonergic medication for this issue and I have asked her to discuss this with either her primary care doctor or her pain doctor.  Will see her back in this clinic in 12 weeks or earlier if there is a problem.  Camellia Denis, MD Allergy / Immunology [facility]

## 2024-03-12 ENCOUNTER — Telehealth: Payer: Self-pay

## 2024-03-12 ENCOUNTER — Encounter: Payer: Self-pay | Admitting: Allergy and Immunology

## 2024-03-12 DIAGNOSIS — K219 Gastro-esophageal reflux disease without esophagitis: Secondary | ICD-10-CM | POA: Diagnosis not present

## 2024-03-12 NOTE — Telephone Encounter (Signed)
   Pre-operative Risk Assessment    Patient Name: Lynn Rollins  DOB: [birth year] MRN: [id number]   Date of last office visit: 08/29/2023 Date of next office visit: N/A   Request for Surgical Clearance    Procedure:  Colonoscopy  Date of Surgery:  Clearance 04/28/24                                Surgeon:  Dr. Evalene Rattler Surgeon's Group or Practice Name:  [facility] Phone number:  [phone] Fax number:  [phone]   Type of Clearance Requested:   - Pharmacy:  Hold Clopidogrel  (Plavix ) 5 day prior    Type of Anesthesia:  Not Indicated   Additional requests/questions:    Bonney Calvert Pouch   03/12/2024, 3:46 PM

## 2024-03-13 NOTE — Telephone Encounter (Signed)
   Patient Name: Lynn Rollins  DOB: March 21, 1966 MRN: [id number]  Primary Cardiologist: Redell Leiter, MD  Chart reviewed as part of pre-operative protocol coverage. Patient has an upcoming colonoscopy scheduled for 04/28/2024 and we were asked to provide recommendations for holding Plavix .   Patient is on Plavix  for history of multiple strokes. Therefore, will defer recommendations for holding this to PCP or Neurology.   I will route this recommendation to the requesting party via Epic fax function and remove from pre-op pool.  Please call with questions.  Chauncey Bruno E Kurtis Anastasia, PA-C 03/13/2024, 9:46 AM

## 2024-03-23 ENCOUNTER — Other Ambulatory Visit: Payer: Self-pay

## 2024-03-30 ENCOUNTER — Other Ambulatory Visit (HOSPITAL_COMMUNITY): Payer: Self-pay

## 2024-03-31 ENCOUNTER — Other Ambulatory Visit: Payer: Self-pay | Admitting: Medical Genetics

## 2024-03-31 DIAGNOSIS — Z006 Encounter for examination for normal comparison and control in clinical research program: Secondary | ICD-10-CM

## 2024-04-01 ENCOUNTER — Other Ambulatory Visit: Payer: Self-pay

## 2024-04-01 NOTE — Progress Notes (Signed)
 Specialty Pharmacy Refill Coordination Note  Lynn Rollins is a 58 y.o. female contacted today regarding refills of specialty medication(s) Tezepelumab -ekko (Tezspire )   Patient requested Delivery   Delivery date: 04/03/24   Verified address: [address]   [facility] [place] [address]   Medication will be filled on: 04/02/24

## 2024-04-08 ENCOUNTER — Other Ambulatory Visit: Payer: Self-pay | Admitting: Allergy and Immunology

## 2024-04-08 LAB — GENECONNECT MOLECULAR SCREEN: Genetic Analysis Overall Interpretation: NEGATIVE

## 2024-04-15 ENCOUNTER — Telehealth: Payer: Self-pay

## 2024-04-15 NOTE — Telephone Encounter (Signed)
 Preop tele appt now scheduled, med rec and consent done.

## 2024-04-15 NOTE — Telephone Encounter (Signed)
°  Patient Consent for Virtual Visit        Lynn Rollins has provided verbal consent on 04/15/2024 for a virtual visit (video or telephone).   CONSENT FOR VIRTUAL VISIT FOR:  Lynn Rollins  By participating in this virtual visit I agree to the following:  I hereby voluntarily request, consent and authorize [facility] and its employed or contracted physicians, physician assistants, nurse practitioners or other licensed health care professionals (the Practitioner), to provide me with telemedicine health care services (the Services) as deemed necessary by the treating Practitioner. I acknowledge and consent to receive the Services by the Practitioner via telemedicine. I understand that the telemedicine visit will involve communicating with the Practitioner through live audiovisual communication technology and the disclosure of certain medical information by electronic transmission. I acknowledge that I have been given the opportunity to request an in-person assessment or other available alternative prior to the telemedicine visit and am voluntarily participating in the telemedicine visit.  I understand that I have the right to withhold or withdraw my consent to the use of telemedicine in the course of my care at any time, without affecting my right to future care or treatment, and that the Practitioner or I may terminate the telemedicine visit at any time. I understand that I have the right to inspect all information obtained and/or recorded in the course of the telemedicine visit and may receive copies of available information for a reasonable fee.  I understand that some of the potential risks of receiving the Services via telemedicine include:  Delay or interruption in medical evaluation due to technological equipment failure or disruption; Information transmitted may not be sufficient (e.g. poor resolution of images) to allow for appropriate medical decision making by the  Practitioner; and/or  In rare instances, security protocols could fail, causing a breach of personal health information.  Furthermore, I acknowledge that it is my responsibility to provide information about my medical history, conditions and care that is complete and accurate to the best of my ability. I acknowledge that Practitioner's advice, recommendations, and/or decision may be based on factors not within their control, such as incomplete or inaccurate data provided by me or distortions of diagnostic images or specimens that may result from electronic transmissions. I understand that the practice of medicine is not an exact science and that Practitioner makes no warranties or guarantees regarding treatment outcomes. I acknowledge that a copy of this consent can be made available to me via my patient portal [facility] MyChart), or I can request a printed copy by calling the office of [facility].    I understand that my insurance will be billed for this visit.   I have read or had this consent read to me. I understand the contents of this consent, which adequately explains the benefits and risks of the Services being provided via telemedicine.  I have been provided ample opportunity to ask questions regarding this consent and the Services and have had my questions answered to my satisfaction. I give my informed consent for the services to be provided through the use of telemedicine in my medical care

## 2024-04-15 NOTE — Telephone Encounter (Signed)
° °  Name: Lynn Rollins  DOB: 1965-06-19  MRN: [id number]  Primary Cardiologist: Redell Leiter, MD   Preoperative team, please contact this patient and set up a phone call appointment for further preoperative risk assessment. Please obtain consent and complete medication review. Thank you for your help. Last OV 07/2023 to discuss cor CT with nonobstructive disease, doing well at that time.  Re: antiplatelet therapy, we are not asked to assess, but patient is on Plavix  per primary care due to hx of stroke so would be sure to include this in the televisit information.  I also confirmed the patient resides in the state of [place] . As per [facility] Medical Board telemedicine laws, the patient must reside in the state in which the provider is licensed.   Raphael LOISE Bring, PA-C 04/15/2024, 2:31 PM [facility]

## 2024-04-15 NOTE — Telephone Encounter (Signed)
° °  Pre-operative Risk Assessment    Patient Name: Lynn Rollins  DOB: [birth year] MRN: [id number]   Date of last office visit: 08/29/2023 Date of next office visit: N/A   Request for Surgical Clearance    Procedure:  Colonoscopy   Date of Surgery:  Clearance 04/28/24                                Surgeon:  Dr. Evalene Misenheimer Surgeon's Group or Practice Name:  [facility] Phone number:  [phone] Fax number:  [phone]   Type of Clearance Requested:   - Medical    Type of Anesthesia:  Not Indicated   Additional requests/questions:    Bonney Calvert Pouch   04/15/2024, 2:17 PM

## 2024-04-17 ENCOUNTER — Ambulatory Visit: Attending: Internal Medicine | Admitting: Student

## 2024-04-17 DIAGNOSIS — Z01810 Encounter for preprocedural cardiovascular examination: Secondary | ICD-10-CM | POA: Diagnosis not present

## 2024-04-17 NOTE — Progress Notes (Signed)
 "   Virtual Visit via Telephone Note   Because of Lynn Rollins's co-morbid illnesses, she is at least at moderate risk for complications without adequate follow up.  This format is felt to be most appropriate for this patient at this time.  The patient did not have access to video technology/had technical difficulties with video requiring transitioning to audio format only (telephone).  All issues noted in this document were discussed and addressed.  No physical exam could be performed with this format.  Please refer to the patient's chart for her consent to telehealth for [facility].  Evaluation Performed:  Preoperative cardiovascular risk assessment _____________   Date:  04/17/2024   Patient ID:  Lynn Rollins, DOB October 17, 1965, MRN [id number] Patient Location:  Home Provider location:   Office  Primary Care Provider:  Keren Vicenta BRAVO, MD  Primary Cardiologist:  [facility] Providers Cardiologist:  Redell Leiter, MD   Chief Complaint / Patient Profile   58 y.o. y/o female with a h/o nonobstructive CAD, hypertension, hyperlipidemia, carotid artery stenosis, CVA, OSA on CPAP, GERD, chronic pain, fibromyalgia who is pending colonoscopy by Dr. Larene on 04/28/2024 and presents today for telephonic preoperative cardiovascular risk assessment.  History of Present Illness    Lynn Rollins is a 58 y.o. female who presents via audio/video conferencing for a telehealth visit today.  Pt was last seen in cardiology clinic on 08/02/2023 by Delon Hoover, NP.  At that time Lynn Rollins was stable from a cardiac standpoint.  The patient is now pending procedure as outlined above. Since her last visit, she is doing well. Patient denies lower extremity edema, orthopnea or PND. No chest pain, pressure, or tightness. No palpitations. She has chronic shortness of breath and DOE related to asthma. She was started on new medication and it is slowly improving. Dyspnea  limits her activity so she cannot participate in regular exercise. She is independent with ADLs and is able to perform light to moderate household chores.    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 pylori infection    Hearing loss of left ear 09/09/2017   Hemorrhoid    Hiatal hernia with GERD    History of cardiomyopathy    History of loop recorder 08/25/2020   History of stroke    Hyperlipemia    Hypoglycemia    IBS (irritable bowel syndrome)    Migraine    Morbid obesity (HCC)    OSA on CPAP    Scalp psoriasis    Sensorineural hearing loss (SNHL) of left ear with unrestricted hearing of right ear 09/26/2017   Formatting of this note might be different from the original. Onset 07/2017 with CVA.  MRI  brain just showed old infarcts. Last Assessment & Plan: Formatting of this note might be different from the original. Follow-up sudden left sensorineural hearing loss. Onset back in April.  She was [facility] at [facility] for stroke.  She was given steroids.  Unfortunately hearing is still very bad.  Some unste   Stroke [facility])    Vertigo    Vitamin D deficiency    Past Surgical History:  Procedure Laterality Date   ABDOMINAL HYSTERECTOMY     CHOLECYSTECTOMY     COLONOSCOPY  04/28/2015   Mild sigmoid  diverticulosis. Otherwise normal colonoscopy   ESOPHAGOGASTRODUODENOSCOPY  09/11/2013   Mild gastritis. Otherwise normal esophagogastroduodenoscopy   LOOP RECORDER INSERTION N/A 10/01/2016   Procedure: Loop Recorder Insertion;  Surgeon: Fernande Elspeth BROCKS, MD;  Location: [facility] INVASIVE CV LAB;  Service: Cardiovascular;  Laterality: N/A;   LOOP RECORDER REMOVAL N/A 09/07/2020   Procedure: LOOP RECORDER REMOVAL;  Surgeon: Fernande Elspeth BROCKS, MD;  Location: [facility] INVASIVE CV LAB;  Service: Cardiovascular;  Laterality: N/A;   OTHER SURGICAL HISTORY     screw in head. Bone anch [place] hearing aid   TEE WITHOUT CARDIOVERSION N/A 10/01/2016   Procedure: TRANSESOPHAGEAL ECHOCARDIOGRAM (TEE);  Surgeon: Alveta Aleene PARAS, MD;  Location: [facility] ENDOSCOPY;  Service: Cardiovascular;  Laterality: N/A;   TUBAL LIGATION      Allergies  Allergies[1]  Home Medications    Prior to Admission medications  Medication Sig Start Date End Date Taking? Authorizing Provider  AIRSUPRA  90-80 MCG/ACT AERO Inhale 2 puffs into the lungs as needed (every 4 to 6 hours for cough, wheeze, shortness of breath. Rinse, gargle, and spit after use). 04/08/24   Kozlow, Camellia PARAS, MD  budesonide-glycopyrrolate-formoterol (BREZTRI  AEROSPHERE) 160-9-4.8 MCG/ACT AERO inhaler Inhale two puffs with spacer twice daily to prevent cough or wheeze. Rinse, gargle, and spit after use. 02/12/24   Kozlow, Camellia PARAS, MD  clopidogrel  (PLAVIX ) 75 MG tablet Take 75 mg by mouth daily.    [provider]  DEXILANT 60 MG capsule Take 60 mg by mouth daily. 08/30/16   [provider]  dicyclomine  (BENTYL ) 20 MG tablet Take 20 mg by mouth 4 (four) times daily. 07/13/22   [provider]  famotidine  (PEPCID ) 40 MG tablet Take 1 tablet by mouth daily. 03/20/19   [provider]  fluticasone  (FLONASE ) 50 MCG/ACT nasal spray Use one spray in each nostril twice daily as directed. 02/12/24   Kozlow, Camellia PARAS, MD  gabapentin (NEURONTIN) 600 MG tablet Take  600 mg by mouth 3 (three) times daily.    [provider]  ipratropium-albuterol  (DUONEB) 0.5-2.5 (3) MG/3ML SOLN Inhale 3 mLs into the lungs every 6 (six) hours as needed (for shortness of breath or wheezing).  08/03/17   [provider]  labetalol (NORMODYNE) 100 MG tablet Take 100 mg by mouth 2 (two) times daily. 07/25/23   [provider]  lisinopril  (PRINIVIL ,ZESTRIL ) 40 MG tablet Take 40 mg by mouth daily.    [provider]  meclizine  (ANTIVERT ) 25 MG tablet Take 25 mg by mouth 3 (three) times daily as needed for dizziness. 01/11/23   [provider]  ondansetron  (ZOFRAN -ODT) 4 MG disintegrating tablet Take 4 mg by mouth every 8 (eight) hours as needed.    [provider]  OXYCONTIN  10 MG 12 hr tablet Take 10 mg by mouth QID.  04/03/17   [provider]  pravastatin  (PRAVACHOL ) 20 MG tablet Take 1 tablet (20 mg total) by mouth every other  day. 08/08/23 04/15/24  Carlin Delon BROCKS, NP  temazepam  (RESTORIL ) 30 MG capsule Take 30 mg by mouth at bedtime as needed for sleep.    [provider]  Tezepelumab -ekko (TEZSPIRE ) 210 MG/1. SOAJ Inject 210 mg into the skin every 28 (twenty-eight) days. 02/13/24   Kozlow, Camellia PARAS, MD  triamcinolone  cream (KENALOG ) 0.1 % Apply 1 Application topically 2 (two) times daily. 02/12/22   [provider]  Vitamin D, Ergocalciferol, (DRISDOL) 50000 units CAPS capsule Take 50,000 Units by mouth every Saturday.  09/17/16   [provider]    Physical Exam    Vital Signs:  Lynn Rollins does not have vital signs available for review today.  Given telephonic nature of communication, physical exam is limited. AAOx3. NAD. Normal affect.  Speech and respirations are unlabored.   Assessment & Plan    Preoperative cardiovascular risk assessment.  Colonoscopy by Dr. Larene on 04/28/2024  Chart reviewed as part of pre-operative protocol coverage. According to the RCRI,  patient has a 0.9% risk of MACE. Patient reports activity equivalent to 5.72 METS (per DASI).   Given past medical history and time since last visit, based on ACC/AHA guidelines, Lynn Rollins would be at acceptable risk for the planned procedure without further cardiovascular testing.   Patient was advised that if she develops new symptoms prior to surgery to contact our office to arrange a follow-up appointment.  she verbalized understanding.  Plavix  prescribed by a noncardiology provider (PCP) therefore recommendations for holding deferred to prescribing provider.     I will route this recommendation to the requesting party via Epic fax function.  Please call with questions.  Time:   Today, I have spent 6 minutes with the patient with telehealth technology discussing medical history, symptoms, and management plan.     Barnie Hila, NP  04/17/2024, 9:14 AM     [1]  Allergies Allergen Reactions   Celexa [Citalopram Hydrobromide] Other (See Comments)    Made me cry and made me worse   Tape Rash and Other (See Comments)    Patient has Fibromyalgia and cannot tolerate medical tape and it's removal   Broccoli [Brassica Oleracea]    Statins Other (See Comments)    Muscles cramp   "

## 2024-04-21 ENCOUNTER — Other Ambulatory Visit: Payer: Self-pay

## 2024-04-24 ENCOUNTER — Encounter (INDEPENDENT_AMBULATORY_CARE_PROVIDER_SITE_OTHER): Payer: Self-pay

## 2024-04-24 ENCOUNTER — Other Ambulatory Visit (HOSPITAL_COMMUNITY): Payer: Self-pay

## 2024-04-27 ENCOUNTER — Other Ambulatory Visit: Payer: Self-pay

## 2024-04-27 NOTE — Progress Notes (Signed)
 Specialty Pharmacy Refill Coordination Note  Lynn Rollins is a 58 y.o. female contacted today regarding refills of specialty medication(s) Tezepelumab -ekko (Tezspire )   Patient requested Delivery   Delivery date: 04/29/24   Verified address: [address] [place] [address]   Medication will be filled on: 04/28/24

## 2024-04-28 ENCOUNTER — Other Ambulatory Visit: Payer: Self-pay

## 2024-05-19 ENCOUNTER — Other Ambulatory Visit: Payer: Self-pay

## 2024-05-22 ENCOUNTER — Other Ambulatory Visit: Payer: Self-pay | Admitting: Pharmacy Technician

## 2024-05-22 ENCOUNTER — Other Ambulatory Visit: Payer: Self-pay

## 2024-05-22 NOTE — Progress Notes (Signed)
 Specialty Pharmacy Refill Coordination Note  Lynn Rollins is a 59 y.o. female contacted today regarding refills of specialty medication(s) Tezepelumab -ekko (Tezspire )   Patient requested Delivery   Delivery date: 05/28/24   Verified address: [address]  [place]   Medication will be filled on: 05/27/24

## 2024-05-27 ENCOUNTER — Other Ambulatory Visit: Payer: Self-pay

## 2024-05-29 ENCOUNTER — Other Ambulatory Visit: Payer: Self-pay | Admitting: Allergy and Immunology

## 2024-06-01 ENCOUNTER — Ambulatory Visit: Admitting: Allergy and Immunology

## 2024-06-08 ENCOUNTER — Ambulatory Visit: Admitting: Allergy and Immunology
